# Patient Record
Sex: Female | Born: 1950 | Race: White | Hispanic: No | Marital: Married | Smoking: Former smoker
Health system: Southern US, Community
[De-identification: ages and names within clinical notes are randomized; demographics above are authoritative.]

## PROBLEM LIST (undated history)

## (undated) DIAGNOSIS — I10 Essential (primary) hypertension: Secondary | ICD-10-CM

## (undated) DIAGNOSIS — C801 Malignant (primary) neoplasm, unspecified: Secondary | ICD-10-CM

## (undated) HISTORY — DX: Essential (primary) hypertension: I10

## (undated) HISTORY — PX: ABDOMINAL HYSTERECTOMY: SHX81

---

## 1997-06-09 ENCOUNTER — Ambulatory Visit (HOSPITAL_COMMUNITY): Admission: RE | Admit: 1997-06-09 | Discharge: 1997-06-09 | Payer: Self-pay | Admitting: Obstetrics and Gynecology

## 1998-01-12 ENCOUNTER — Other Ambulatory Visit: Admission: RE | Admit: 1998-01-12 | Discharge: 1998-01-12 | Payer: Self-pay | Admitting: Obstetrics and Gynecology

## 1998-02-23 ENCOUNTER — Other Ambulatory Visit: Admission: RE | Admit: 1998-02-23 | Discharge: 1998-02-23 | Payer: Self-pay | Admitting: Obstetrics and Gynecology

## 1998-10-12 ENCOUNTER — Ambulatory Visit (HOSPITAL_COMMUNITY): Admission: RE | Admit: 1998-10-12 | Discharge: 1998-10-12 | Payer: Self-pay | Admitting: Obstetrics and Gynecology

## 1998-10-18 ENCOUNTER — Ambulatory Visit (HOSPITAL_COMMUNITY): Admission: RE | Admit: 1998-10-18 | Discharge: 1998-10-18 | Payer: Self-pay | Admitting: Obstetrics and Gynecology

## 1998-11-08 ENCOUNTER — Ambulatory Visit (HOSPITAL_COMMUNITY): Admission: RE | Admit: 1998-11-08 | Discharge: 1998-11-08 | Payer: Self-pay | Admitting: Obstetrics and Gynecology

## 1998-11-08 ENCOUNTER — Encounter (INDEPENDENT_AMBULATORY_CARE_PROVIDER_SITE_OTHER): Payer: Self-pay

## 1998-11-08 ENCOUNTER — Encounter: Payer: Self-pay | Admitting: Obstetrics and Gynecology

## 1999-06-10 ENCOUNTER — Other Ambulatory Visit: Admission: RE | Admit: 1999-06-10 | Discharge: 1999-06-10 | Payer: Self-pay | Admitting: Obstetrics and Gynecology

## 2002-02-27 ENCOUNTER — Encounter: Payer: Self-pay | Admitting: Obstetrics and Gynecology

## 2002-02-27 ENCOUNTER — Encounter: Admission: RE | Admit: 2002-02-27 | Discharge: 2002-02-27 | Payer: Self-pay | Admitting: Obstetrics and Gynecology

## 2003-05-01 ENCOUNTER — Encounter: Admission: RE | Admit: 2003-05-01 | Discharge: 2003-05-01 | Payer: Self-pay | Admitting: Family Medicine

## 2004-09-30 ENCOUNTER — Encounter: Admission: RE | Admit: 2004-09-30 | Discharge: 2004-09-30 | Payer: Self-pay | Admitting: Family Medicine

## 2005-11-16 ENCOUNTER — Encounter: Admission: RE | Admit: 2005-11-16 | Discharge: 2005-11-16 | Payer: Self-pay | Admitting: Family Medicine

## 2006-12-28 ENCOUNTER — Encounter: Admission: RE | Admit: 2006-12-28 | Discharge: 2006-12-28 | Payer: Self-pay | Admitting: Family Medicine

## 2008-01-23 ENCOUNTER — Encounter: Admission: RE | Admit: 2008-01-23 | Discharge: 2008-01-23 | Payer: Self-pay | Admitting: Family Medicine

## 2009-06-11 ENCOUNTER — Encounter: Admission: RE | Admit: 2009-06-11 | Discharge: 2009-06-11 | Payer: Self-pay | Admitting: Family Medicine

## 2010-05-15 ENCOUNTER — Encounter: Payer: Self-pay | Admitting: Family Medicine

## 2010-05-16 ENCOUNTER — Encounter: Payer: Self-pay | Admitting: Family Medicine

## 2010-06-06 ENCOUNTER — Other Ambulatory Visit: Payer: Self-pay | Admitting: Family Medicine

## 2010-06-06 DIAGNOSIS — Z1231 Encounter for screening mammogram for malignant neoplasm of breast: Secondary | ICD-10-CM

## 2010-07-05 ENCOUNTER — Ambulatory Visit
Admission: RE | Admit: 2010-07-05 | Discharge: 2010-07-05 | Disposition: A | Discharge status: Home / Self Care | Payer: BC Managed Care – PPO | Source: Ambulatory Visit | Attending: Family Medicine | Admitting: Family Medicine

## 2010-07-05 DIAGNOSIS — Z1231 Encounter for screening mammogram for malignant neoplasm of breast: Secondary | ICD-10-CM

## 2011-07-11 ENCOUNTER — Other Ambulatory Visit: Payer: Self-pay | Admitting: Family Medicine

## 2011-07-11 DIAGNOSIS — Z1231 Encounter for screening mammogram for malignant neoplasm of breast: Secondary | ICD-10-CM

## 2011-07-24 ENCOUNTER — Ambulatory Visit
Admission: RE | Admit: 2011-07-24 | Discharge: 2011-07-24 | Disposition: A | Discharge status: Home / Self Care | Payer: BC Managed Care – PPO | Source: Ambulatory Visit | Attending: Family Medicine | Admitting: Family Medicine

## 2011-07-24 DIAGNOSIS — Z1231 Encounter for screening mammogram for malignant neoplasm of breast: Secondary | ICD-10-CM

## 2012-03-05 ENCOUNTER — Emergency Department (HOSPITAL_COMMUNITY)
Admission: EM | Admit: 2012-03-05 | Discharge: 2012-03-05 | Disposition: A | Discharge status: Home / Self Care | Payer: BC Managed Care – PPO | Source: Home / Self Care

## 2012-10-07 ENCOUNTER — Other Ambulatory Visit: Payer: Self-pay

## 2012-10-07 DIAGNOSIS — Z1231 Encounter for screening mammogram for malignant neoplasm of breast: Secondary | ICD-10-CM

## 2012-11-05 ENCOUNTER — Ambulatory Visit
Admission: RE | Admit: 2012-11-05 | Discharge: 2012-11-05 | Disposition: A | Discharge status: Home / Self Care | Payer: BC Managed Care – PPO | Source: Ambulatory Visit

## 2012-11-05 DIAGNOSIS — Z1231 Encounter for screening mammogram for malignant neoplasm of breast: Secondary | ICD-10-CM

## 2013-08-16 ENCOUNTER — Encounter: Payer: Self-pay | Admitting: *Deleted

## 2013-08-16 DIAGNOSIS — I10 Essential (primary) hypertension: Secondary | ICD-10-CM

## 2013-09-09 ENCOUNTER — Encounter: Payer: Self-pay | Admitting: Family Medicine

## 2013-09-09 ENCOUNTER — Ambulatory Visit (INDEPENDENT_AMBULATORY_CARE_PROVIDER_SITE_OTHER): Payer: BC Managed Care – PPO | Admitting: Family Medicine

## 2013-09-09 VITALS — BP 146/78 | HR 80 | Temp 97.4°F | Resp 16 | Ht 64.0 in | Wt 144.0 lb

## 2013-09-09 DIAGNOSIS — I10 Essential (primary) hypertension: Secondary | ICD-10-CM

## 2013-09-09 DIAGNOSIS — F17200 Nicotine dependence, unspecified, uncomplicated: Secondary | ICD-10-CM

## 2013-09-09 DIAGNOSIS — R011 Cardiac murmur, unspecified: Secondary | ICD-10-CM

## 2013-09-09 LAB — CBC WITH DIFFERENTIAL/PLATELET
BASOS ABS: 0 10*3/uL (ref 0.0–0.1)
BASOS PCT: 0 % (ref 0–1)
EOS PCT: 4 % (ref 0–5)
Eosinophils Absolute: 0.3 10*3/uL (ref 0.0–0.7)
HCT: 40.8 % (ref 36.0–46.0)
HEMOGLOBIN: 14.5 g/dL (ref 12.0–15.0)
LYMPHS PCT: 23 % (ref 12–46)
Lymphs Abs: 2 10*3/uL (ref 0.7–4.0)
MCH: 32 pg (ref 26.0–34.0)
MCHC: 35.5 g/dL (ref 30.0–36.0)
MCV: 90.1 fL (ref 78.0–100.0)
MONO ABS: 0.9 10*3/uL (ref 0.1–1.0)
MONOS PCT: 10 % (ref 3–12)
Neutro Abs: 5.4 10*3/uL (ref 1.7–7.7)
Neutrophils Relative %: 63 % (ref 43–77)
PLATELETS: 374 10*3/uL (ref 150–400)
RBC: 4.53 MIL/uL (ref 3.87–5.11)
RDW: 13 % (ref 11.5–15.5)
WBC: 8.5 10*3/uL (ref 4.0–10.5)

## 2013-09-09 LAB — COMPREHENSIVE METABOLIC PANEL
ALT: 14 U/L (ref 0–35)
AST: 23 U/L (ref 0–37)
Albumin: 4.5 g/dL (ref 3.5–5.2)
Alkaline Phosphatase: 69 U/L (ref 39–117)
BILIRUBIN TOTAL: 0.4 mg/dL (ref 0.2–1.2)
BUN: 8 mg/dL (ref 6–23)
CALCIUM: 10.4 mg/dL (ref 8.4–10.5)
CO2: 25 meq/L (ref 19–32)
CREATININE: 0.72 mg/dL (ref 0.50–1.10)
Chloride: 103 mEq/L (ref 96–112)
Glucose, Bld: 81 mg/dL (ref 70–99)
Potassium: 4.3 mEq/L (ref 3.5–5.3)
Sodium: 142 mEq/L (ref 135–145)
Total Protein: 6.9 g/dL (ref 6.0–8.3)

## 2013-09-09 LAB — TSH: TSH: 0.465 u[IU]/mL (ref 0.350–4.500)

## 2013-09-09 MED ORDER — VERAPAMIL HCL ER 240 MG PO CP24: 240.0000 mg | ORAL_CAPSULE | Freq: Every day | ORAL | Status: DC

## 2013-09-09 MED ORDER — LISINOPRIL-HYDROCHLOROTHIAZIDE 20-12.5 MG PO TABS: 1.0000 | ORAL_TABLET | Freq: Every day | ORAL | Status: DC

## 2013-09-09 NOTE — Progress Notes (Signed)
Subjective:   This chart was scribed for Kristin Knapp, MD, by Kristin Griffin, ED Scribe. This  patient's care was started at 9: 21 AM.    Patient ID: Kristin Griffin, female    DOB: Dec 10, 1950, 63 y.o.   MRN: [id number]  Chief Complaint  Patient presents with  . Medication Refill    Lisinopril HCT    HPI  Kristin Griffin is a 63 y.o. female who presents to Kristin Griffin for a medication refill of Lisinopril-hctz and verapamil. The pt reports she ran out of her medication a week ago, and when she went to the prescribing provider, she was informed the provider had moved practices. She requests a new PCP. Kristin Griffin reports she checks her BP at home and normally has readings of an elevated systolic and a diastolic in the 75'I.  The pt denies chest pain, palpitations, and SOB. 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   The pt reports she often wakes up several times in the night and grazes on food. Last night, she ate grapes, sugar-free ice cream, and a Pepsi overnight so she is not fasting even though she has not eaten this morning.   She smokes 0.5 pack/day. She reports "high hopes" of quitting. She has chantix, but she has not used it for fear of the side effects.   Kristin Griffin had a complete physical last year. She reports she has mammograms annually and performs self-breast exams. She has a h/o hysterectomy and does not have annual pap smears.   She reports current allergies with associated rhinorrhea.   Past Medical History  Diagnosis Date  . Hypertension    Current Outpatient Prescriptions on File Prior to Visit  Medication Sig Dispense Refill  . calcium carbonate (OS-CAL) 600 MG TABS tablet Take 600 mg by mouth 2 (two) times daily with a meal.      . lisinopril-hydrochlorothiazide (PRINZIDE,ZESTORETIC) 20-12.5 MG per tablet Take 1 tablet by mouth daily.      . Multiple Vitamin (MULTIVITAMIN) capsule Take 1 capsule  by mouth daily.      . Omega-3 Fatty Acids (FISH OIL) 1000 MG CAPS Take by mouth.      . verapamil (VERELAN PM) 240 MG 24 hr capsule Take 240 mg by mouth at bedtime.      Marland Kitchen zolpidem (AMBIEN) 10 MG tablet Take 10 mg by mouth at bedtime as needed for sleep.       No current facility-administered medications on file prior to visit.    No Known Allergies   Review of Systems  Constitutional: Negative for fever, chills, activity change, appetite change and unexpected weight change.  HENT: Positive for congestion and rhinorrhea. Negative for sinus pressure.   Respiratory: Negative for cough, shortness of breath and wheezing.   Cardiovascular: Negative for chest pain, palpitations and leg swelling.  Allergic/Immunologic: Positive for environmental allergies.  Neurological: Negative for syncope.     Vitals: BP 146/78  Pulse 80  Temp(Src) 97.4 F (36.3 C) (Oral)  Resp 16  Ht 5\' 4"  (1.626 m)  Wt 144 lb (65.318 kg)  BMI 24.71 kg/m2  SpO2 96%    Objective:   Physical Exam  Nursing note and vitals reviewed. Constitutional: She is oriented to person, place, and time. She appears well-developed and well-nourished. No distress.  HENT:  Head: Normocephalic and atraumatic.  Right Ear: External ear normal.  Left Ear: External ear normal.  Eyes: Conjunctivae  and EOM are normal. No scleral icterus.  Neck: Normal range of motion. Neck supple. No tracheal deviation present. No thyromegaly present.  Cardiovascular: Normal rate, regular rhythm and intact distal pulses.   Murmur (over RUSB) heard.  Systolic murmur is present with a grade of 2/6  Pulmonary/Chest: Effort normal. No respiratory distress. She has wheezes. She has no rales.  Mild expiratory wheeze anterioraly on right.   Musculoskeletal: Normal range of motion. She exhibits no edema.  Lymphadenopathy:    She has no cervical adenopathy.  Neurological: She is alert and oriented to person, place, and time.  Skin: Skin is warm and dry.  She is not diaphoretic. No erythema.  Psychiatric: She has a normal mood and affect. Her behavior is normal.       Assessment & Plan:  Tobacco use disorder - encouraged cessation - pt has chantix - discuss at f/u  Essential hypertension, benign - Plan: Comprehensive metabolic panel, CBC with Differential, TSH, 2D Echocardiogram without contrast - restart prior meds. Recheck in 6-8 wks w/ fasting cpe.  Undiagnosed cardiac murmurs - Plan: 2D Echocardiogram without contrast - consider cardiology referral.  Meds ordered this encounter  Medications  . lisinopril-hydrochlorothiazide (PRINZIDE,ZESTORETIC) 20-12.5 MG per tablet    Sig: Take 1 tablet by mouth daily.    Dispense:  30 tablet    Refill:  1  . verapamil (VERELAN PM) 240 MG 24 hr capsule    Sig: Take 1 capsule (240 mg total) by mouth at bedtime.    Dispense:  30 capsule    Refill:  1    I personally performed the services described in this documentation, which was scribed in my presence. The recorded information has been reviewed and considered, and addended by me as needed.  Kristin Cheadle, MD MPH

## 2013-09-09 NOTE — Patient Instructions (Signed)
Hypertension As your heart beats, it forces blood through your arteries. This force is your blood pressure. If the pressure is too high, it is called hypertension (HTN) or high blood pressure. HTN is dangerous because you may have it and not know it. High blood pressure may mean that your heart has to work harder to pump blood. Your arteries may be narrow or stiff. The extra work puts you at risk for heart disease, stroke, and other problems.  Blood pressure consists of two numbers, a higher number over a lower, 110/72, for example. It is stated as "110 over 72." The ideal is below 120 for the top number (systolic) and under 80 for the bottom (diastolic). Write down your blood pressure today. You should pay close attention to your blood pressure if you have certain conditions such as:  Heart failure.  Prior heart attack.  Diabetes  Chronic kidney disease.  Prior stroke.  Multiple risk factors for heart disease. To see if you have HTN, your blood pressure should be measured while you are seated with your arm held at the level of the heart. It should be measured at least twice. A one-time elevated blood pressure reading (especially in the Emergency Department) does not mean that you need treatment. There may be conditions in which the blood pressure is different between your right and left arms. It is important to see your caregiver soon for a recheck. Most people have essential hypertension which means that there is not a specific cause. This type of high blood pressure may be lowered by changing lifestyle factors such as:  Stress.  Smoking.  Lack of exercise.  Excessive weight.  Drug/tobacco/alcohol use.  Eating less salt. Most people do not have symptoms from high blood pressure until it has caused damage to the body. Effective treatment can often prevent, delay or reduce that damage. TREATMENT  When a cause has been identified, treatment for high blood pressure is directed at the  cause. There are a large number of medications to treat HTN. These fall into several categories, and your caregiver will help you select the medicines that are best for you. Medications may have side effects. You should review side effects with your caregiver. If your blood pressure stays high after you have made lifestyle changes or started on medicines,   Your medication(s) may need to be changed.  Other problems may need to be addressed.  Be certain you understand your prescriptions, and know how and when to take your medicine.  Be sure to follow up with your caregiver within the time frame advised (usually within two weeks) to have your blood pressure rechecked and to review your medications.  If you are taking more than one medicine to lower your blood pressure, make sure you know how and at what times they should be taken. Taking two medicines at the same time can result in blood pressure that is too low. SEEK IMMEDIATE MEDICAL CARE IF:  You develop a severe headache, blurred or changing vision, or confusion.  You have unusual weakness or numbness, or a faint feeling.  You have severe chest or abdominal pain, vomiting, or breathing problems. MAKE SURE YOU:   Understand these instructions.  Will watch your condition.  Will get help right away if you are not doing well or get worse. Document Released: 04/10/2005 Document Revised: 07/03/2011 Document Reviewed: 11/29/2007 [facility] Patient Information 2014 [place]. Managing Your High Blood Pressure Blood pressure is a measurement of how forceful your blood is  pressing against the walls of the arteries. Arteries are muscular tubes within the circulatory system. Blood pressure does not stay the same. Blood pressure rises when you are active, excited, or nervous; and it lowers during sleep and relaxation. If the numbers measuring your blood pressure stay above normal most of the time, you are at risk for health problems. High blood  pressure (hypertension) is a long-term (chronic) condition in which blood pressure is elevated. A blood pressure reading is recorded as two numbers, such as 120 over 80 (or 120/80). The first, higher number is called the systolic pressure. It is a measure of the pressure in your arteries as the heart beats. The second, lower number is called the diastolic pressure. It is a measure of the pressure in your arteries as the heart relaxes between beats.  Keeping your blood pressure in a normal range is important to your overall health and prevention of health problems, such as heart disease and stroke. When your blood pressure is uncontrolled, your heart has to work harder than normal. High blood pressure is a very common condition in adults because blood pressure tends to rise with age. Men and women are equally likely to have hypertension but at different times in life. Before age 27, men are more likely to have hypertension. After 65 years of age, women are more likely to have it. Hypertension is especially common in African Americans. This condition often has no signs or symptoms. The cause of the condition is usually not known. Your caregiver can help you come up with a plan to keep your blood pressure in a normal, healthy range. BLOOD PRESSURE STAGES Blood pressure is classified into four stages: normal, prehypertension, stage 1, and stage 2. Your blood pressure reading will be used to determine what type of treatment, if any, is necessary. Appropriate treatment options are tied to these four stages:  Normal  Systolic pressure (mm Hg): below 120.  Diastolic pressure (mm Hg): below 80. Prehypertension  Systolic pressure (mm Hg): 120 to 139.  Diastolic pressure (mm Hg): 80 to 89. Stage1  Systolic pressure (mm Hg): 140 to 159.  Diastolic pressure (mm Hg): 90 to 99. Stage2  Systolic pressure (mm Hg): 160 or above.  Diastolic pressure (mm Hg): 100 or above. RISKS RELATED TO HIGH BLOOD  PRESSURE Managing your blood pressure is an important responsibility. Uncontrolled high blood pressure can lead to:  A heart attack.  A stroke.  A weakened blood vessel (aneurysm).  Heart failure.  Kidney damage.  Eye damage.  Metabolic syndrome.  Memory and concentration problems. HOW TO MANAGE YOUR BLOOD PRESSURE Blood pressure can be managed effectively with lifestyle changes and medicines (if needed). Your caregiver will help you come up with a plan to bring your blood pressure within a normal range. Your plan should include the following: Education  Read all information provided by your caregivers about how to control blood pressure.  Educate yourself on the latest guidelines and treatment recommendations. New research is always being done to further define the risks and treatments for high blood pressure. Lifestylechanges  Control your weight.  Avoid smoking.  Stay physically active.  Reduce the amount of salt in your diet.  Reduce stress.  Control any chronic conditions, such as high cholesterol or diabetes.  Reduce your alcohol intake. Medicines  Several medicines (antihypertensive medicines) are available, if needed, to bring blood pressure within a normal range. Communication  Review all the medicines you take with your caregiver because there may be  side effects or interactions.  Talk with your caregiver about your diet, exercise habits, and other lifestyle factors that may be contributing to high blood pressure.  See your caregiver regularly. Your caregiver can help you create and adjust your plan for managing high blood pressure. RECOMMENDATIONS FOR TREATMENT AND FOLLOW-UP  The following recommendations are based on current guidelines for managing high blood pressure in nonpregnant adults. Use these recommendations to identify the proper follow-up period or treatment option based on your blood pressure reading. You can discuss these options with your  caregiver.  Systolic pressure of 297 to 989 or diastolic pressure of 80 to 89: Follow up with your caregiver as directed.  Systolic pressure of 211 to 941 or diastolic pressure of 90 to 100: Follow up with your caregiver within 2 months.  Systolic pressure above 740 or diastolic pressure above 814: Follow up with your caregiver within 1 month.  Systolic pressure above 481 or diastolic pressure above 856: Consider antihypertensive therapy; follow up with your caregiver within 1 week.  Systolic pressure above 314 or diastolic pressure above 970: Begin antihypertensive therapy; follow up with your caregiver within 1 week. Document Released: 01/03/2012 Document Reviewed: 01/03/2012 [facility] Patient Information 2014 [place], [place]. Smoking Cessation Quitting smoking is important to your health and has many advantages. However, it is not always easy to quit since nicotine is a very addictive drug. Often times, people try 3 times or more before being able to quit. This document explains the best ways for you to prepare to quit smoking. Quitting takes hard work and a lot of effort, but you can do it. ADVANTAGES OF QUITTING SMOKING  You will live longer, feel better, and live better.  Your body will feel the impact of quitting smoking almost immediately.  Within 20 minutes, blood pressure decreases. Your pulse returns to its normal level.  After 8 hours, carbon monoxide levels in the blood return to normal. Your oxygen level increases.  After 24 hours, the chance of having a heart attack starts to decrease. Your breath, hair, and body stop smelling like smoke.  After 48 hours, damaged nerve endings begin to recover. Your sense of taste and smell improve.  After 72 hours, the body is virtually free of nicotine. Your bronchial tubes relax and breathing becomes easier.  After 2 to 12 weeks, lungs can hold more air. Exercise becomes easier and circulation improves.  The risk of having a heart  attack, stroke, cancer, or lung disease is greatly reduced.  After 1 year, the risk of coronary heart disease is cut in half.  After 5 years, the risk of stroke falls to the same as a nonsmoker.  After 10 years, the risk of lung cancer is cut in half and the risk of other cancers decreases significantly.  After 15 years, the risk of coronary heart disease drops, usually to the level of a nonsmoker.  If you are pregnant, quitting smoking will improve your chances of having a healthy baby.  The people you live with, especially any children, will be healthier.  You will have extra money to spend on things other than cigarettes. QUESTIONS TO THINK ABOUT BEFORE ATTEMPTING TO QUIT You may want to talk about your answers with your caregiver.  Why do you want to quit?  If you tried to quit in the past, what helped and what did not?  What will be the most difficult situations for you after you quit? How will you plan to handle them?  Who  can help you through the tough times? Your family? Friends? A caregiver?  What pleasures do you get from smoking? What ways can you still get pleasure if you quit? Here are some questions to ask your caregiver:  How can you help me to be successful at quitting?  What medicine do you think would be best for me and how should I take it?  What should I do if I need more help?  What is smoking withdrawal like? How can I get information on withdrawal? GET READY  Set a quit date.  Change your environment by getting rid of all cigarettes, ashtrays, matches, and lighters in your home, car, or work. Do not let people smoke in your home.  Review your past attempts to quit. Think about what worked and what did not. GET SUPPORT AND ENCOURAGEMENT You have a better chance of being successful if you have help. You can get support in many ways.  Tell your family, friends, and co-workers that you are going to quit and need their support. Ask them not to smoke  around you.  Get individual, group, or telephone counseling and support. Programs are available at [employer] and health centers. Call your local health department for information about programs in your area.  Spiritual beliefs and practices may help some smokers quit.  Download a "quit meter" on your computer to keep track of quit statistics, such as how long you have gone without smoking, cigarettes not smoked, and money saved.  Get a self-help book about quitting smoking and staying off of tobacco. [place] yourself from urges to smoke. Talk to someone, go for a walk, or occupy your time with a task.  Change your normal routine. Take a different route to work. Drink tea instead of coffee. Eat breakfast in a different place.  Reduce your stress. Take a hot bath, exercise, or read a book.  Plan something enjoyable to do every day. Reward yourself for not smoking.  Explore interactive web-based programs that specialize in helping you quit. GET MEDICINE AND USE IT CORRECTLY Medicines can help you stop smoking and decrease the urge to smoke. Combining medicine with the above behavioral methods and support can greatly increase your chances of successfully quitting smoking.  Nicotine replacement therapy helps deliver nicotine to your body without the negative effects and risks of smoking. Nicotine replacement therapy includes nicotine gum, lozenges, inhalers, nasal sprays, and skin patches. Some may be available over-the-counter and others require a prescription.  Antidepressant medicine helps people abstain from smoking, but how this works is unknown. This medicine is available by prescription.  Nicotinic receptor partial agonist medicine simulates the effect of nicotine in your brain. This medicine is available by prescription. Ask your caregiver for advice about which medicines to use and how to use them based on your health history. Your caregiver will  tell you what side effects to look out for if you choose to be on a medicine or therapy. Carefully read the information on the package. Do not use any other product containing nicotine while using a nicotine replacement product.  RELAPSE OR DIFFICULT SITUATIONS Most relapses occur within the first 3 months after quitting. Do not be discouraged if you start smoking again. Remember, most people try several times before finally quitting. You may have symptoms of withdrawal because your body is used to nicotine. You may crave cigarettes, be irritable, feel very hungry, cough often, get headaches, or have difficulty concentrating. The withdrawal  symptoms are only temporary. They are strongest when you first quit, but they will go away within 10 14 days. To reduce the chances of relapse, try to:  Avoid drinking alcohol. Drinking lowers your chances of successfully quitting.  Reduce the amount of caffeine you consume. Once you quit smoking, the amount of caffeine in your body increases and can give you symptoms, such as a rapid heartbeat, sweating, and anxiety.  Avoid smokers because they can make you want to smoke.  Do not let weight gain distract you. Many smokers will gain weight when they quit, usually less than 10 pounds. Eat a healthy diet and stay active. You can always lose the weight gained after you quit.  Find ways to improve your mood other than smoking. FOR MORE INFORMATION  www.smokefree.gov  Document Released: 04/04/2001 Document Revised: 10/10/2011 Document Reviewed: 07/20/2011 [facility] Patient Information 2014 [place], [place].

## 2013-09-18 ENCOUNTER — Telehealth: Payer: Self-pay | Admitting: Family Medicine

## 2013-09-18 NOTE — Telephone Encounter (Signed)
Message copied by Chinita Pester on Thu Sep 18, 2013  4:14 PM ------      Message from: [place], [name]      Created: Tue Sep 09, 2013  9:22 AM       Please call pt to set up appt for full physical and blood pressure recheck in about 6 wks.  ES ------

## 2013-09-18 NOTE — Telephone Encounter (Signed)
LMOM to CB. 

## 2013-11-10 ENCOUNTER — Telehealth: Payer: Self-pay

## 2013-11-10 NOTE — Telephone Encounter (Signed)
Dr. Brigitte Pulse:  We had to move [facility] appt. From 7/24 to 7/31 for a CPE due to a scheduling error.  She needs refills on her blood pressure medicine.  She uses [facility] on [address]  Patient states if she can get another refill, she would prefer to have her CPE on your next available early a.m. Appointment due to them being very short staffed at work.  Her work number is [phone].

## 2013-11-11 MED ORDER — LISINOPRIL-HYDROCHLOROTHIAZIDE 20-12.5 MG PO TABS: 1.0000 | ORAL_TABLET | Freq: Every day | ORAL | Status: DC

## 2013-11-11 MED ORDER — VERAPAMIL HCL ER 240 MG PO CP24: 240.0000 mg | ORAL_CAPSULE | Freq: Every day | ORAL | Status: DC

## 2013-11-11 NOTE — Telephone Encounter (Signed)
Sent in RFs of both BP meds. Do you want to let the pt know and reschedule her appt?

## 2013-11-21 ENCOUNTER — Encounter: Payer: Self-pay | Admitting: Family Medicine

## 2013-11-21 ENCOUNTER — Ambulatory Visit (INDEPENDENT_AMBULATORY_CARE_PROVIDER_SITE_OTHER): Payer: BC Managed Care – PPO | Admitting: Family Medicine

## 2013-11-21 VITALS — BP 153/96 | HR 85 | Temp 98.1°F | Resp 18 | Ht 65.0 in | Wt 113.0 lb

## 2013-11-21 DIAGNOSIS — Z1239 Encounter for other screening for malignant neoplasm of breast: Secondary | ICD-10-CM

## 2013-11-21 DIAGNOSIS — F17200 Nicotine dependence, unspecified, uncomplicated: Secondary | ICD-10-CM

## 2013-11-21 DIAGNOSIS — Z0000 Encounter for general adult medical examination without abnormal findings: Secondary | ICD-10-CM

## 2013-11-21 DIAGNOSIS — Z780 Asymptomatic menopausal state: Secondary | ICD-10-CM

## 2013-11-21 DIAGNOSIS — I10 Essential (primary) hypertension: Secondary | ICD-10-CM

## 2013-11-21 DIAGNOSIS — Z79899 Other long term (current) drug therapy: Secondary | ICD-10-CM

## 2013-11-21 DIAGNOSIS — Z1211 Encounter for screening for malignant neoplasm of colon: Secondary | ICD-10-CM

## 2013-11-21 MED ORDER — VERAPAMIL HCL ER 240 MG PO CP24: 240.0000 mg | ORAL_CAPSULE | Freq: Every day | ORAL | Status: DC

## 2013-11-21 MED ORDER — LISINOPRIL-HYDROCHLOROTHIAZIDE 20-25 MG PO TABS: 1.0000 | ORAL_TABLET | Freq: Every day | ORAL | Status: DC

## 2013-11-21 NOTE — Progress Notes (Signed)
Subjective:    Patient ID: Kristin Griffin, female    DOB: 1950/10/06, 63 y.o.   MRN: [id number] This chart was scribed for Shawnee Knapp, MD by Cathie Hoops, ED Scribe. The patient was seen in Room 29. The patient's care was started at 4:13 PM.   Chief Complaint  Patient presents with  . Annual Exam   Past Medical History  Diagnosis Date  . Hypertension    Current Outpatient Prescriptions on File Prior to Visit  Medication Sig Dispense Refill  . calcium carbonate (OS-CAL) 600 MG TABS tablet Take 600 mg by mouth 2 (two) times daily with a meal.      . lisinopril-hydrochlorothiazide (PRINZIDE,ZESTORETIC) 20-12.5 MG per tablet Take 1 tablet by mouth daily.  30 tablet  0  . Multiple Vitamin (MULTIVITAMIN) capsule Take 1 capsule by mouth daily.      . Omega-3 Fatty Acids (FISH OIL) 1000 MG CAPS Take by mouth.      . verapamil (VERELAN PM) 240 MG 24 hr capsule Take 1 capsule (240 mg total) by mouth at bedtime.  30 capsule  0  . zolpidem (AMBIEN) 10 MG tablet Take 10 mg by mouth at bedtime as needed for sleep.       No current facility-administered medications on file prior to visit.   No Known Allergies  History reviewed. No pertinent past surgical history. History   Social History  . Marital Status: Married    Spouse Name: N/A    Number of Children: N/A  . Years of Education: N/A   Social History Main Topics  . Smoking status: Current Every Day Smoker -- 0.50 packs/day  . Smokeless tobacco: None  . Alcohol Use: No  . Drug Use: No  . Sexual Activity: None   Other Topics Concern  . None   Social History Narrative  . None   History reviewed. No pertinent family history.   HPI HPI Comments: Kristin Griffin is a 63 y.o. female who presents to the [facility] for annual exam. Pt reports she is anxious about seeing doctors and attributes her high BP today to this. Pt reports her BP at home is normally 130s/80s. Pt denies urgency, frequency, dysuria or  hematuria. Pt reports her cholesterol will be high because her HDL would be high compared to LDL. Pt reports her cardiologist thinks she may need medication to control her hyperlipemia. Pt reports she has decreased her smoking to 0.25 pack/day. Pt states she needs her MMG. Pt denies she's ever having bone density checked. Pt reports she is due for her colonoscopy. Pt states she is on a strict fixed income. Pt states she'd like to she Dr. Collene Mares for her colonoscopy. Pt reports she is not up-to-date on her tetanus. Pt reports she had her shingles vaccine 5 years ago. Pt would like to wait on her tetanus. Pt reports she has a diagnosed enlarged heart. Pt reports she previously had several years of non-controlled BP medication.   Kristin Griffin was seen 2 month prior, she had been off her BP medication so they were restarted. She was thinking about trying Chantix to quit smoking. She has MMG annually. She does not need pap smear due to history of hysterectomy. She was referred for an echocardiogram. Her MMG was last done 11/06/2012.    Review of Systems  Constitutional: Negative for fever and fatigue.  HENT: Negative for congestion and drooling.   Eyes: Negative for pain.  Respiratory: Negative for cough  and shortness of breath.   Gastrointestinal: Negative for nausea, vomiting and abdominal pain.  Genitourinary: Negative for dysuria, urgency, frequency and hematuria.  Musculoskeletal: Negative for neck pain.  Skin: Negative for color change.  Neurological: Negative for dizziness and headaches.  Psychiatric/Behavioral: Negative for behavioral problems.  All other systems reviewed and are negative.     Objective:   Physical Exam  Nursing note and vitals reviewed. Constitutional: She is oriented to person, place, and time. She appears well-developed.  HENT:  Head: Normocephalic and atraumatic.  Right Ear: Tympanic membrane normal.  Left Ear: Tympanic membrane normal.  Nose: Nose normal.    Mouth/Throat: Oropharynx is clear and moist.  Eyes: Conjunctivae and EOM are normal. No scleral icterus.  Neck: Neck supple. No thyromegaly present.  Cardiovascular: Normal rate, regular rhythm, S1 normal, S2 normal and normal heart sounds.   No murmur heard. Pulmonary/Chest: Effort normal and breath sounds normal. No respiratory distress.  Lungs clear.  Abdominal: Soft. Bowel sounds are normal. She exhibits no distension. There is no tenderness. There is no rebound.  Musculoskeletal: Normal range of motion. She exhibits no edema.  Lymphadenopathy:    She has no cervical adenopathy.  Neurological: She is oriented to person, place, and time.  Skin: No rash noted. No erythema.  Psychiatric: She has a normal mood and affect. Her behavior is normal.     Triage Vitals: BP 153/96  Pulse 85  Temp(Src) 98.1 F (36.7 C) (Oral)  Resp 18  Ht 5\' 5"  (1.651 m)  Wt 113 lb (51.256 kg)  BMI 18.80 kg/m2  SpO2 96%  Assessment & Plan:  4:28 PM- Patient informed of current plan for treatment and evaluation and agrees with plan at this time.  Pt will check BP at home, if not improved pt will schedule a follow-up appointment.   Kristin Griffin never got the echocardiogram that I referred her for after our last visit. At last visit I heard 2+/6 systolic murmur over her right upper sternal border she was also having wheezing likely due to her COPD at that time.  Routine general medical examination at a health care facility - Plan: MM Digital Screening, DG Bone Density, Lipid panel, Comprehensive metabolic panel, Vit D  25 hydroxy (rtn osteoporosis monitoring)  Tobacco use disorder - Plan: DG Bone Density, Vit D  25 hydroxy (rtn osteoporosis monitoring)  Essential hypertension, benign - Plan: Lipid panel, Comprehensive metabolic panel  Special screening for malignant neoplasms, colon - Plan: Ambulatory referral to Gastroenterology  Screening for breast cancer - Plan: MM Digital  Screening  Postmenopausal estrogen deficiency - Plan: MM Digital Screening, DG Bone Density, Vit D  25 hydroxy (rtn osteoporosis monitoring)  Encounter for long-term (current) use of other medications - Plan: Comprehensive metabolic panel  Meds ordered this encounter  Medications  . lisinopril-hydrochlorothiazide (PRINZIDE,ZESTORETIC) 20-25 MG per tablet    Sig: Take 1 tablet by mouth daily.    Dispense:  90 tablet    Refill:  1  . verapamil (VERELAN PM) 240 MG 24 hr capsule    Sig: Take 1 capsule (240 mg total) by mouth at bedtime.    Dispense:  30 capsule    Refill:  5    I personally performed the services described in this documentation, which was scribed in my presence. The recorded information has been reviewed and considered, and addended by me as needed.  Delman Cheadle, MD MPH  Results for orders placed in visit on 11/21/13  LIPID PANEL  Result Value Ref Range   Cholesterol 241 (*) 0 - 200 mg/dL   Triglycerides 90  <150 mg/dL   HDL 107  >39 mg/dL   Total CHOL/HDL Ratio 2.3     VLDL 18  0 - 40 mg/dL   LDL Cholesterol 116 (*) 0 - 99 mg/dL  COMPREHENSIVE METABOLIC PANEL      Result Value Ref Range   Sodium 143  135 - 145 mEq/L   Potassium 4.5  3.5 - 5.3 mEq/L   Chloride 103  96 - 112 mEq/L   CO2 28  19 - 32 mEq/L   Glucose, Bld 91  70 - 99 mg/dL   BUN 9  6 - 23 mg/dL   Creat 0.76  0.50 - 1.10 mg/dL   Total Bilirubin 0.7  0.2 - 1.2 mg/dL   Alkaline Phosphatase 67  39 - 117 U/L   AST 23  0 - 37 U/L   ALT 16  0 - 35 U/L   Total Protein 7.4  6.0 - 8.3 g/dL   Albumin 4.5  3.5 - 5.2 g/dL   Calcium 10.4  8.4 - 10.5 mg/dL  VITAMIN D 25 HYDROXY      Result Value Ref Range   Vit D, 25-Hydroxy 74  30 - 89 ng/mL

## 2013-11-21 NOTE — Patient Instructions (Addendum)
After you turn 65 yo, you will need a prevnar pneumonia vaccine with a pneumovax pneumonia vaccine a year later. You should get your tetanus shot updated right before you retire since medicare does not pay for a TDaP immunization. We will want to get a baseline EKG at your next visit. We can now screen for lung cancer with a low radiation dose chest CT - if you would like to be referred for this, let me know. CONGRATULATIONS on cutting down on cigarettes - keep up the fantastic work! Check your blood pressure at home and if it is still >130 on top or >85 on bottom consistently, call or send a message through MyChart so we can increase your verapamil.  Keeping You Healthy  Get These Tests  Blood Pressure- Have your blood pressure checked by your healthcare provider at least once a year.  Normal blood pressure is 120/80.  Weight- Have your body mass index (BMI) calculated to screen for obesity.  BMI is a measure of body fat based on height and weight.  You can calculate your own BMI at [employer]  Cholesterol- Have your cholesterol checked every year.  Diabetes- Have your blood sugar checked every year if you have high blood pressure, high cholesterol, a family history of diabetes or if you are overweight.  Pap Smear- Have a pap smear every 1 to 3 years if you have been sexually active.  If you are older than 65 and recent pap smears have been normal you may not need additional pap smears.  In addition, if you have had a hysterectomy  For benign disease additional pap smears are not necessary.  Mammogram-Yearly mammograms are essential for early detection of breast cancer  Screening for Colon Cancer- Colonoscopy starting at age 65. Screening may begin sooner depending on your family history and other health conditions.  Follow up colonoscopy as directed by your Gastroenterologist.  Screening for Osteoporosis- Screening begins at age 65 with bone density scanning, sooner if you  are at higher risk for developing Osteoporosis.  Get these medicines  Calcium with Vitamin D- Your body requires 1200-1500 mg of Calcium a day and [phone] IU of Vitamin D a day.  You can only absorb 500 mg of Calcium at a time therefore Calcium must be taken in 2 or 3 separate doses throughout the day.  Hormones- Hormone therapy has been associated with increased risk for certain cancers and heart disease.  Talk to your healthcare provider about if you need relief from menopausal symptoms.  Aspirin- Ask your healthcare provider about taking Aspirin to prevent Heart Disease and Stroke.  Get these Immuniztions  Flu shot- Every fall  Pneumonia shot- Once after the age of 70; if you are younger ask your healthcare provider if you need a pneumonia shot.  Tetanus- Every ten years.  Zostavax- Once after the age of 66 to prevent shingles.  Take these steps  Don't smoke- Your healthcare provider can help you quit. For tips on how to quit, ask your healthcare provider or go to www.smokefree.gov or call 1-800 QUIT-NOW.  Be physically active- Exercise 5 days a week for a minimum of 30 minutes.  If you are not already physically active, start slow and gradually work up to 30 minutes of moderate physical activity.  Try walking, dancing, bike riding, swimming, etc.  Eat a healthy diet- Eat a variety of healthy foods such as fruits, vegetables, whole grains, low fat milk, low fat cheeses, yogurt, lean meats, chicken, fish, eggs, dried  beans, tofu, etc.  For more information go to www.thenutritionsource.org  Dental visit- Brush and floss teeth twice daily; visit your dentist twice a year.  Eye exam- Visit your Optometrist or Ophthalmologist yearly.  Drink alcohol in moderation- Limit alcohol intake to one drink or less a day.  Never drink and drive.  Depression- Your emotional health is as important as your physical health.  If you're feeling down or losing interest in things you normally enjoy,  please talk to your healthcare provider.  Seat Belts- can save your life; always wear one  Smoke/Carbon Monoxide detectors- These detectors need to be installed on the appropriate level of your home.  Replace batteries at least once a year.  Violence- If anyone is threatening or hurting you, please tell your healthcare provider.  Living Will/ Health care power of attorney- Discuss with your healthcare provider and family.   Systolic pressure of 073 to 710 or diastolic pressure of 80 to 89: Follow up with me in 6 mos.  Systolic pressure of 626 to 948 or diastolic pressure of 90 to 100: Follow up with me within 1 months.  Systolic pressure above 546 or diastolic pressure above 270: Follow up with your caregiver within 1 week.  Systolic pressure above 350 or diastolic pressure above 093: Come to clinic immediately for evaluation.  Systolic pressure above 818 or diastolic pressure above 299: Go to ER immediately for evaluation and treatment - consider calling 911 if you feel abnormal at all (chest pain, headache, vision change, lightheaded, etc).  Managing Your High Blood Pressure Blood pressure is a measurement of how forceful your blood is pressing against the walls of the arteries. Arteries are muscular tubes within the circulatory system. Blood pressure does not stay the same. Blood pressure rises when you are active, excited, or nervous; and it lowers during sleep and relaxation. If the numbers measuring your blood pressure stay above normal most of the time, you are at risk for health problems. High blood pressure (hypertension) is a long-term (chronic) condition in which blood pressure is elevated. A blood pressure reading is recorded as two numbers, such as 120 over 80 (or 120/80). The first, higher number is called the systolic pressure. It is a measure of the pressure in your arteries as the heart beats. The second, lower number is called the diastolic pressure. It is a measure of the  pressure in your arteries as the heart relaxes between beats.  Keeping your blood pressure in a normal range is important to your overall health and prevention of health problems, such as heart disease and stroke. When your blood pressure is uncontrolled, your heart has to work harder than normal. High blood pressure is a very common condition in adults because blood pressure tends to rise with age. Men and women are equally likely to have hypertension but at different times in life. Before age 30, men are more likely to have hypertension. After 65 years of age, women are more likely to have it. Hypertension is especially common in African Americans. This condition often has no signs or symptoms. The cause of the condition is usually not known. Your caregiver can help you come up with a plan to keep your blood pressure in a normal, healthy range. BLOOD PRESSURE STAGES Blood pressure is classified into four stages: normal, prehypertension, stage 1, and stage 2. Your blood pressure reading will be used to determine what type of treatment, if any, is necessary. Appropriate treatment options are tied to these four  stages:  Normal  Systolic pressure (mm Hg): below 120.  Diastolic pressure (mm Hg): below 80. Prehypertension  Systolic pressure (mm Hg): 120 to 139.  Diastolic pressure (mm Hg): 80 to 89. Stage1  Systolic pressure (mm Hg): 140 to 159.  Diastolic pressure (mm Hg): 90 to 99. Stage2  Systolic pressure (mm Hg): 160 or above.  Diastolic pressure (mm Hg): 100 or above. RISKS RELATED TO HIGH BLOOD PRESSURE Managing your blood pressure is an important responsibility. Uncontrolled high blood pressure can lead to:  A heart attack.  A stroke.  A weakened blood vessel (aneurysm).  Heart failure.  Kidney damage.  Eye damage.  Metabolic syndrome.  Memory and concentration problems. HOW TO MANAGE YOUR BLOOD PRESSURE Blood pressure can be managed effectively with lifestyle changes  and medicines (if needed). Your caregiver will help you come up with a plan to bring your blood pressure within a normal range. Your plan should include the following: Education  Read all information provided by your caregivers about how to control blood pressure.  Educate yourself on the latest guidelines and treatment recommendations. New research is always being done to further define the risks and treatments for high blood pressure. Lifestylechanges  Control your weight.  Avoid smoking.  Stay physically active.  Reduce the amount of salt in your diet.  Reduce stress.  Control any chronic conditions, such as high cholesterol or diabetes.  Reduce your alcohol intake. Medicines  Several medicines (antihypertensive medicines) are available, if needed, to bring blood pressure within a normal range. Communication  Review all the medicines you take with your caregiver because there may be side effects or interactions.  Talk with your caregiver about your diet, exercise habits, and other lifestyle factors that may be contributing to high blood pressure.  See your caregiver regularly. Your caregiver can help you create and adjust your plan for managing high blood pressure. RECOMMENDATIONS FOR TREATMENT AND FOLLOW-UP  The following recommendations are based on current guidelines for managing high blood pressure in nonpregnant adults. Use these recommendations to identify the proper follow-up period or treatment option based on your blood pressure reading. You can discuss these options with your caregiver. Document Released: 01/03/2012 Document Reviewed: 01/03/2012 [facility] Patient Information 2015 [place]. This information is not intended to replace advice given to you by your health care provider. Make sure you discuss any questions you have with your health care provider.

## 2013-11-22 LAB — LIPID PANEL
CHOL/HDL RATIO: 2.3 ratio
CHOLESTEROL: 241 mg/dL — AB (ref 0–200)
HDL: 107 mg/dL (ref >39)
LDL Cholesterol: 116 mg/dL — ABNORMAL HIGH (ref 0–99)
TRIGLYCERIDES: 90 mg/dL (ref <150)
VLDL: 18 mg/dL (ref 0–40)

## 2013-11-22 LAB — COMPREHENSIVE METABOLIC PANEL
ALBUMIN: 4.5 g/dL (ref 3.5–5.2)
ALK PHOS: 67 U/L (ref 39–117)
ALT: 16 U/L (ref 0–35)
AST: 23 U/L (ref 0–37)
BILIRUBIN TOTAL: 0.7 mg/dL (ref 0.2–1.2)
BUN: 9 mg/dL (ref 6–23)
CALCIUM: 10.4 mg/dL (ref 8.4–10.5)
CO2: 28 meq/L (ref 19–32)
CREATININE: 0.76 mg/dL (ref 0.50–1.10)
Chloride: 103 mEq/L (ref 96–112)
GLUCOSE: 91 mg/dL (ref 70–99)
POTASSIUM: 4.5 meq/L (ref 3.5–5.3)
Sodium: 143 mEq/L (ref 135–145)
Total Protein: 7.4 g/dL (ref 6.0–8.3)

## 2013-11-22 LAB — VITAMIN D 25 HYDROXY (VIT D DEFICIENCY, FRACTURES): Vit D, 25-Hydroxy: 74 ng/mL (ref 30–89)

## 2013-12-15 ENCOUNTER — Ambulatory Visit
Admission: RE | Admit: 2013-12-15 | Discharge: 2013-12-15 | Disposition: A | Discharge status: Home / Self Care | Payer: BC Managed Care – PPO | Source: Ambulatory Visit | Attending: Family Medicine | Admitting: Family Medicine

## 2013-12-15 DIAGNOSIS — Z780 Asymptomatic menopausal state: Secondary | ICD-10-CM

## 2013-12-15 DIAGNOSIS — Z1239 Encounter for other screening for malignant neoplasm of breast: Secondary | ICD-10-CM

## 2013-12-15 DIAGNOSIS — Z0000 Encounter for general adult medical examination without abnormal findings: Secondary | ICD-10-CM

## 2013-12-15 DIAGNOSIS — F17200 Nicotine dependence, unspecified, uncomplicated: Secondary | ICD-10-CM

## 2013-12-15 DIAGNOSIS — M810 Age-related osteoporosis without current pathological fracture: Secondary | ICD-10-CM | POA: Insufficient documentation

## 2013-12-17 ENCOUNTER — Other Ambulatory Visit: Payer: Self-pay | Admitting: Family Medicine

## 2013-12-17 DIAGNOSIS — R928 Other abnormal and inconclusive findings on diagnostic imaging of breast: Secondary | ICD-10-CM

## 2013-12-21 ENCOUNTER — Encounter: Payer: Self-pay | Admitting: Family Medicine

## 2013-12-21 ENCOUNTER — Other Ambulatory Visit: Payer: Self-pay | Admitting: Family Medicine

## 2013-12-21 MED ORDER — ALENDRONATE SODIUM 70 MG PO TABS: 70.0000 mg | ORAL_TABLET | ORAL | Status: DC

## 2013-12-24 ENCOUNTER — Ambulatory Visit
Admission: RE | Admit: 2013-12-24 | Discharge: 2013-12-24 | Disposition: A | Discharge status: Home / Self Care | Payer: BC Managed Care – PPO | Source: Ambulatory Visit | Attending: Family Medicine | Admitting: Family Medicine

## 2013-12-24 DIAGNOSIS — R928 Other abnormal and inconclusive findings on diagnostic imaging of breast: Secondary | ICD-10-CM

## 2014-02-03 ENCOUNTER — Ambulatory Visit (INDEPENDENT_AMBULATORY_CARE_PROVIDER_SITE_OTHER): Payer: BC Managed Care – PPO | Admitting: Family Medicine

## 2014-02-03 VITALS — BP 142/90 | HR 103 | Temp 98.7°F | Resp 16 | Ht 65.0 in | Wt 112.8 lb

## 2014-02-03 DIAGNOSIS — I10 Essential (primary) hypertension: Secondary | ICD-10-CM

## 2014-02-03 DIAGNOSIS — H109 Unspecified conjunctivitis: Secondary | ICD-10-CM

## 2014-02-03 MED ORDER — GENTAMICIN SULFATE 0.3 % OP SOLN
2.0000 [drp] | OPHTHALMIC | Status: DC
Start: 2014-02-03 — End: 2015-01-12

## 2014-02-03 NOTE — Progress Notes (Addendum)
Subjective:    Patient ID: Kristin Griffin, female    DOB: February 10, 1951, 63 y.o.   MRN: [id number] This chart was scribed for Delman Cheadle, MD by Zola Button, Medical Scribe. This patient was seen in Room 4 and the patient's care was started at 12:17 PM.  Chief Complaint  Patient presents with  . Eye Problem    left eye; sxs started this morning; red and drainage issues    HPI HPI Comments: Kristin Griffin is a 63 y.o. female who presents to the [facility] complaining of an eye problem in her left eye that began last night. She has been doing well otherwise.  Eye: She notes being tender in her left eye with eye redness and thick drainage. Patient denies HA, photophobia, and vision changes. Patient wears glasses normally and has worn contacts in the past but currently feels that she can see better without her glasses for the past few years.  HTN: Her BP has been measured around high 120s/low 80s at home.  Always gets white coat HTN in office  Past Medical History  Diagnosis Date  . Hypertension    Current Outpatient Prescriptions on File Prior to Visit  Medication Sig Dispense Refill  . alendronate (FOSAMAX) 70 MG tablet Take 1 tablet (70 mg total) by mouth every 7 (seven) days. Take with a full glass of water on an empty stomach.  4 tablet  11  . calcium carbonate (OS-CAL) 600 MG TABS tablet Take 600 mg by mouth 2 (two) times daily with a meal.      . lisinopril-hydrochlorothiazide (PRINZIDE,ZESTORETIC) 20-25 MG per tablet Take 1 tablet by mouth daily.  90 tablet  1  . Multiple Vitamin (MULTIVITAMIN) capsule Take 1 capsule by mouth daily.      . Omega-3 Fatty Acids (FISH OIL) 1000 MG CAPS Take by mouth.      . verapamil (VERELAN PM) 240 MG 24 hr capsule Take 1 capsule (240 mg total) by mouth at bedtime.  30 capsule  5   No current facility-administered medications on file prior to visit.   No Known Allergies   Review of Systems  Constitutional: Negative for  appetite change and fatigue.  HENT: Negative for congestion, ear discharge and sinus pressure.   Eyes: Positive for pain, discharge and redness. Negative for photophobia and visual disturbance.  Respiratory: Negative for cough.   Cardiovascular: Negative for chest pain.  Gastrointestinal: Negative for abdominal pain and diarrhea.  Genitourinary: Negative for frequency and hematuria.  Musculoskeletal: Negative for back pain.  Skin: Negative for rash.  Neurological: Negative for seizures and headaches.  Psychiatric/Behavioral: Negative for hallucinations.       Objective:  BP 142/90  Pulse 103  Temp(Src) 98.7 F (37.1 C) (Oral)  Resp 16  Ht 5\' 5"  (1.651 m)  Wt 112 lb 12.8 oz (51.166 kg)  BMI 18.77 kg/m2  SpO2 94%  Physical Exam  Nursing note and vitals reviewed. Constitutional: She is oriented to person, place, and time. She appears well-developed and well-nourished. No distress.  HENT:  Head: Normocephalic and atraumatic.  Mouth/Throat: Oropharynx is clear and moist. No oropharyngeal exudate.  Eyes: EOM are normal. Pupils are equal, round, and reactive to light.  Left eye conjunctiva injected on medial and lateral aspects with chemosis of lateral conjunctiva around 9pm next to iris, approximately 70mm in diameter, well defined. Bilateral fundoscopic exam benign. Woods lamp with fluorescin stain after proparacaine numbing drops showed no focal uptake.  Neck: Neck supple.  Cardiovascular: Normal rate.   Pulmonary/Chest: Effort normal.  Musculoskeletal: She exhibits no edema.  Neurological: She is alert and oriented to person, place, and time. No cranial nerve deficit.  Skin: Skin is warm and dry. No rash noted.  Psychiatric: She has a normal mood and affect. Her behavior is normal.        Visual Acuity Screening   Right eye Left eye Both eyes  Without correction: 20/40 20/30 20/25   With correction: 20/40 20/40 20/40     Assessment & Plan:   Essential hypertension -  known white coat HTN - bp at home controlled on current regimen  Conjunctivitis of left eye RTC if no improvement or worsening and would likely need to see optho.  Warm wet compresses qid.  Cont drops until sxs resolved x 48hrs. Reviewed hygiene. Meds ordered this encounter  Medications  . gentamicin (GARAMYCIN) 0.3 % ophthalmic solution    Sig: Place 2 drops into the left eye every 4 (four) hours. Ok to use 2 drops every hour until you begin to improve.    Dispense:  5 mL    Refill:  0    I personally performed the services described in this documentation, which was scribed in my presence. The recorded information has been reviewed and considered, and addended by me as needed.  Delman Cheadle, MD MPH

## 2014-02-03 NOTE — Patient Instructions (Signed)
If worsening at all or any vision changes or HAs, come back to clinic immed and/or see your eye doctor immediately. Try warm wet compresses 4x/day and use 2 drops of the antibiotic eye drop every hour until you start to improve than every 4 hours while awake until symptoms have been gone for 48 hrs.  Conjunctivitis Conjunctivitis is commonly called "pink eye." Conjunctivitis can be caused by bacterial or viral infection, allergies, or injuries. There is usually redness of the lining of the eye, itching, discomfort, and sometimes discharge. There may be deposits of matter along the eyelids. A viral infection usually causes a watery discharge, while a bacterial infection causes a yellowish, thick discharge. Pink eye is very contagious and spreads by direct contact. You may be given antibiotic eyedrops as part of your treatment. Before using your eye medicine, remove all drainage from the eye by washing gently with warm water and cotton balls. Continue to use the medication until you have awakened 2 mornings in a row without discharge from the eye. Do not rub your eye. This increases the irritation and helps spread infection. Use separate towels from other household members. Wash your hands with soap and water before and after touching your eyes. Use cold compresses to reduce pain and sunglasses to relieve irritation from light. Do not wear contact lenses or wear eye makeup until the infection is gone. SEEK MEDICAL CARE IF:   Your symptoms are not better after 3 days of treatment.  You have increased pain or trouble seeing.  The outer eyelids become very red or swollen. Document Released: 05/18/2004 Document Revised: 07/03/2011 Document Reviewed: 04/10/2005 [facility] Patient Information 2015 [place], [place]. This information is not intended to replace advice given to you by your health care provider. Make sure you discuss any questions you have with your health care provider.

## 2014-02-07 ENCOUNTER — Encounter: Payer: Self-pay | Admitting: Family Medicine

## 2014-02-07 DIAGNOSIS — H5789 Other specified disorders of eye and adnexa: Secondary | ICD-10-CM

## 2014-02-07 DIAGNOSIS — H5712 Ocular pain, left eye: Secondary | ICD-10-CM

## 2014-02-09 ENCOUNTER — Telehealth: Payer: Self-pay

## 2014-02-09 NOTE — Telephone Encounter (Signed)
Pt wants to talk with dr Brigitte Pulse about being referred to a ophthamologist

## 2014-02-10 NOTE — Telephone Encounter (Signed)
Referrals is working on this referral. LM for pt to rtn call if any further questions.

## 2014-03-31 ENCOUNTER — Telehealth: Payer: Self-pay | Admitting: Family Medicine

## 2014-03-31 NOTE — Telephone Encounter (Signed)
Patient received her flu shot at [facility] on 01/09/14

## 2014-06-16 ENCOUNTER — Other Ambulatory Visit: Payer: Self-pay | Admitting: Family Medicine

## 2014-08-12 ENCOUNTER — Other Ambulatory Visit: Payer: Self-pay | Admitting: Family Medicine

## 2014-09-14 ENCOUNTER — Other Ambulatory Visit: Payer: Self-pay | Admitting: Family Medicine

## 2014-10-15 ENCOUNTER — Other Ambulatory Visit: Payer: Self-pay | Admitting: Family Medicine

## 2014-10-19 ENCOUNTER — Emergency Department (HOSPITAL_COMMUNITY): Payer: Medicare Other

## 2014-10-19 ENCOUNTER — Emergency Department (HOSPITAL_COMMUNITY)
Admission: EM | Admit: 2014-10-19 | Discharge: 2014-10-19 | Disposition: A | Discharge status: Home / Self Care | Payer: Medicare Other | Attending: Emergency Medicine | Admitting: Emergency Medicine

## 2014-10-19 ENCOUNTER — Encounter (HOSPITAL_COMMUNITY): Payer: Self-pay | Admitting: Family Medicine

## 2014-10-19 DIAGNOSIS — Z79899 Other long term (current) drug therapy: Secondary | ICD-10-CM | POA: Insufficient documentation

## 2014-10-19 DIAGNOSIS — W208XXA Other cause of strike by thrown, projected or falling object, initial encounter: Secondary | ICD-10-CM | POA: Insufficient documentation

## 2014-10-19 DIAGNOSIS — Y92009 Unspecified place in unspecified non-institutional (private) residence as the place of occurrence of the external cause: Secondary | ICD-10-CM | POA: Insufficient documentation

## 2014-10-19 DIAGNOSIS — S52602A Unspecified fracture of lower end of left ulna, initial encounter for closed fracture: Secondary | ICD-10-CM | POA: Diagnosis not present

## 2014-10-19 DIAGNOSIS — Z720 Tobacco use: Secondary | ICD-10-CM | POA: Insufficient documentation

## 2014-10-19 DIAGNOSIS — Y998 Other external cause status: Secondary | ICD-10-CM | POA: Diagnosis not present

## 2014-10-19 DIAGNOSIS — I10 Essential (primary) hypertension: Secondary | ICD-10-CM | POA: Insufficient documentation

## 2014-10-19 DIAGNOSIS — S52502A Unspecified fracture of the lower end of left radius, initial encounter for closed fracture: Secondary | ICD-10-CM | POA: Insufficient documentation

## 2014-10-19 DIAGNOSIS — Y9389 Activity, other specified: Secondary | ICD-10-CM | POA: Insufficient documentation

## 2014-10-19 DIAGNOSIS — W19XXXA Unspecified fall, initial encounter: Secondary | ICD-10-CM

## 2014-10-19 DIAGNOSIS — S6992XA Unspecified injury of left wrist, hand and finger(s), initial encounter: Secondary | ICD-10-CM | POA: Diagnosis present

## 2014-10-19 NOTE — Discharge Instructions (Signed)
1. Medications: usual home medications 2. Treatment: rest, drink plenty of fluids, ice, elevate 3. Follow Up: Please followup with your orthopedist in 3-5 days for discussion of your diagnoses and further evaluation after today's visit; if you do not have a primary care doctor use the resource guide provided to find one; Please return to the ER for swelling or increased pain    Cast or Splint Care Casts and splints support injured limbs and keep bones from moving while they heal. It is important to care for your cast or splint at home.  HOME CARE INSTRUCTIONS  Keep the cast or splint uncovered during the drying period. It can take 24 to 48 hours to dry if it is made of plaster. A fiberglass cast will dry in less than 1 hour.  Do not rest the cast on anything harder than a pillow for the first 24 hours.  Do not put weight on your injured limb or apply pressure to the cast until your health care provider gives you permission.  Keep the cast or splint dry. Wet casts or splints can lose their shape and may not support the limb as well. A wet cast that has lost its shape can also create harmful pressure on your skin when it dries. Also, wet skin can become infected.  Cover the cast or splint with a plastic bag when bathing or when out in the rain or snow. If the cast is on the trunk of the body, take sponge baths until the cast is removed.  If your cast does become wet, dry it with a towel or a blow dryer on the cool setting only.  Keep your cast or splint clean. Soiled casts may be wiped with a moistened cloth.  Do not place any hard or soft foreign objects under your cast or splint, such as cotton, toilet paper, lotion, or powder.  Do not try to scratch the skin under the cast with any object. The object could get stuck inside the cast. Also, scratching could lead to an infection. If itching is a problem, use a blow dryer on a cool setting to relieve discomfort.  Do not trim or cut your  cast or remove padding from inside of it.  Exercise all joints next to the injury that are not immobilized by the cast or splint. For example, if you have a long leg cast, exercise the hip joint and toes. If you have an arm cast or splint, exercise the shoulder, elbow, thumb, and fingers.  Elevate your injured arm or leg on 1 or 2 pillows for the first 1 to 3 days to decrease swelling and pain.It is best if you can comfortably elevate your cast so it is higher than your heart. SEEK MEDICAL CARE IF:   Your cast or splint cracks.  Your cast or splint is too tight or too loose.  You have unbearable itching inside the cast.  Your cast becomes wet or develops a soft spot or area.  You have a bad smell coming from inside your cast.  You get an object stuck under your cast.  Your skin around the cast becomes red or raw.  You have new pain or worsening pain after the cast has been applied. SEEK IMMEDIATE MEDICAL CARE IF:   You have fluid leaking through the cast.  You are unable to move your fingers or toes.  You have discolored (blue or white), cool, painful, or very swollen fingers or toes beyond the cast.  You  have tingling or numbness around the injured area.  You have severe pain or pressure under the cast.  You have any difficulty with your breathing or have shortness of breath.  You have chest pain. Document Released: 04/07/2000 Document Revised: 01/29/2013 Document Reviewed: 10/17/2012 [facility] Patient Information 2015 [place], [place]. This information is not intended to replace advice given to you by your health care provider. Make sure you discuss any questions you have with your health care provider.

## 2014-10-19 NOTE — ED Provider Notes (Signed)
CSN: [id number]     Arrival date & time 10/19/14  1512 History  This chart was scribed for non-physician practitioner, Abigail Butts, PA-C working with Sherwood Gambler, MD, by Erling Conte, ED Scribe. This patient was seen in room TR07C/TR07C and the patient's care was started at 5:09 PM.     Chief Complaint  Patient presents with  . Fall    The history is provided by the patient and medical records. No language interpreter was used.    HPI Comments: Kristin Griffin is a 64 y.o. female with no PMHx who presents to the Emergency Department complaining of a fall that occurred 1.5 days ago. Pt states she had gotten up in the middle of the night and was half asleep, tripping on a box and falling. She reports she caught herself with her left arm. She is complaining of associated left hip pain, left wrist pain and left wrist swelling. She denies any head injury or LOC from the fall. She reports she immediately ambulatory after the fall and left hip pain did not begin until the next day.  She states she is still able to walk and bear weight on her left hip. Pt denies any anticoagulant use. She states she has taken Ibuprofen, aspirin and hydrocodone. She denies any significant relief for the pain with those treatments. Pt notes she has followed up with an orthopedist in the past, Dr. Meridee Score at [facility]. She denies any rib pain or abdominal pain.    Past Medical History  Diagnosis Date  . Hypertension    History reviewed. No pertinent past surgical history. History reviewed. No pertinent family history. History  Substance Use Topics  . Smoking status: Current Every Day Smoker -- 0.50 packs/day  . Smokeless tobacco: Not on file  . Alcohol Use: No   OB History    No data available     Review of Systems  Constitutional: Negative for fever and chills.  Cardiovascular: Negative for chest pain.  Gastrointestinal: Negative for nausea, vomiting and abdominal pain.   Musculoskeletal: Positive for myalgias, joint swelling and arthralgias. Negative for back pain, gait problem, neck pain and neck stiffness.  Skin: Negative for wound.  Neurological: Negative for syncope, numbness and headaches.  Hematological: Does not bruise/bleed easily.  Psychiatric/Behavioral: The patient is not nervous/anxious.   All other systems reviewed and are negative.     Allergies  Review of patient's allergies indicates no known allergies.  Home Medications   Prior to Admission medications   Medication Sig Start Date End Date Taking? Authorizing Provider  calcium carbonate (OS-CAL) 600 MG TABS tablet Take 600 mg by mouth 2 (two) times daily with a meal.   Yes Historical Provider, MD  lisinopril-hydrochlorothiazide (PRINZIDE,ZESTORETIC) 20-25 MG per tablet take 1 tablet by mouth once daily 09/15/14  Yes Shawnee Knapp, MD  Multiple Vitamin (MULTIVITAMIN) capsule Take 1 capsule by mouth daily.   Yes Historical Provider, MD  Omega-3 Fatty Acids (FISH OIL) 1000 MG CAPS Take by mouth.   Yes Historical Provider, MD  verapamil (CALAN-SR) 240 MG CR tablet take 1 tablet by mouth at bedtime 09/15/14  Yes Shawnee Knapp, MD  verapamil (VERELAN PM) 240 MG 24 hr capsule Take 1 capsule (240 mg total) by mouth at bedtime. 11/21/13  Yes Shawnee Knapp, MD  alendronate (FOSAMAX) 70 MG tablet Take 1 tablet (70 mg total) by mouth every 7 (seven) days. Take with a full glass of water on an empty stomach. 12/21/13  Shawnee Knapp, MD  gentamicin (GARAMYCIN) 0.3 % ophthalmic solution Place 2 drops into the left eye every 4 (four) hours. Ok to use 2 drops every hour until you begin to improve. 02/03/14   Shawnee Knapp, MD   Triage Vitals:  BP 104/67 mmHg  Pulse 94  Temp(Src) 98.3 F (36.8 C)  Resp 18  SpO2 95%  Physical Exam  Constitutional: She is oriented to person, place, and time. She appears well-developed and well-nourished. No distress.  HENT:  Head: Normocephalic and atraumatic.  Nose: Nose normal.   Mouth/Throat: Uvula is midline, oropharynx is clear and moist and mucous membranes are normal.  Eyes: Conjunctivae and EOM are normal. Pupils are equal, round, and reactive to light.  Neck: No spinous process tenderness and no muscular tenderness present. No rigidity. Normal range of motion present.  Full ROM without pain No midline cervical tenderness No crepitus, deformity or step-offs No paraspinal tenderness  Cardiovascular: Normal rate, regular rhythm, normal heart sounds and intact distal pulses.   No murmur heard. Pulses:      Radial pulses are 2+ on the right side, and 2+ on the left side.       Dorsalis pedis pulses are 2+ on the right side, and 2+ on the left side.       Posterior tibial pulses are 2+ on the right side, and 2+ on the left side.  Pulmonary/Chest: Effort normal and breath sounds normal. No accessory muscle usage. No respiratory distress. She has no decreased breath sounds. She has no wheezes. She has no rhonchi. She has no rales. She exhibits no tenderness and no bony tenderness.  No contusion or ecchymosis No flail segment, crepitus or deformity Equal chest expansion  Abdominal: Soft. Normal appearance and bowel sounds are normal. There is no tenderness. There is no rigidity, no guarding and no CVA tenderness.  No contusion or ecchymosis Abd soft and nontender  Musculoskeletal: Normal range of motion.       Thoracic back: She exhibits normal range of motion.       Lumbar back: She exhibits normal range of motion.  Full range of motion of the T-spine and L-spine No tenderness to palpation of the spinous processes of the T-spine or L-spine No crepitus, deformity or step-offs No tenderness to palpation of the paraspinous muscles of the L-spine TTP of the left hip with visible ecchymosis but no deformity and full range of motion Full range of motion of the left shoulder, elbow and all fingers of the left hand; no range of motion of the left wrist due to pain.  Obvious ecchymosis and mild swelling about the left wrist without significant deformity  Lymphadenopathy:    She has no cervical adenopathy.  Neurological: She is alert and oriented to person, place, and time. She has normal reflexes. No cranial nerve deficit. GCS eye subscore is 4. GCS verbal subscore is 5. GCS motor subscore is 6.  Reflex Scores:      Bicep reflexes are 2+ on the right side and 2+ on the left side.      Brachioradialis reflexes are 2+ on the right side and 2+ on the left side.      Patellar reflexes are 2+ on the right side and 2+ on the left side.      Achilles reflexes are 2+ on the right side and 2+ on the left side. Speech is clear and goal oriented, follows commands Normal 5/5 strength in upper and lower extremities bilaterally including  dorsiflexion and plantar flexion, except for 0/5 flexion and extension of the left wrist; strong and equal grip strength Sensation normal to light and sharp touch Moves extremities without ataxia, coordination intact Normal gait and balance No Clonus  Skin: Skin is warm and dry. No rash noted. She is not diaphoretic. No erythema.  Psychiatric: She has a normal mood and affect.  Nursing note and vitals reviewed.   ED Course  Procedures (including critical care time)  DIAGNOSTIC STUDIES: Oxygen Saturation is 95% on RA, normal by my interpretation.    COORDINATION OF CARE: 5:22 PM- Will consult with hand surgeon on call for [employer].  Pt advised of plan for treatment and pt agrees.   Labs Review Labs Reviewed - No data to display  Imaging Review Dg Wrist Complete Left  10/19/2014   CLINICAL DATA:  Fall from bed yesterday with left wrist pain, initial encounter  EXAM: LEFT WRIST - COMPLETE 3+ VIEW  COMPARISON:  None.  FINDINGS: There fractures of the distal radius and ulna with mild impaction at the radial fracture site. Generalized soft tissue swelling is noted. No significant displacement is seen.  IMPRESSION:  Distal radial and ulnar fractures.   Electronically Signed   By: Inez Catalina M.D.   On: 10/19/2014 16:19   Dg Hip Unilat With Pelvis 2-3 Views Left  10/19/2014   CLINICAL DATA:  Fall from bed yesterday with persistent hip pain, initial encounter  EXAM: LEFT HIP (WITH PELVIS) 2-3 VIEWS  COMPARISON:  None.  FINDINGS: The pelvic ring is intact. No acute fracture or dislocation is seen. No soft tissue changes are noted. Diffuse vascular calcifications are noted.  IMPRESSION: No acute abnormality seen.   Electronically Signed   By: Inez Catalina M.D.   On: 10/19/2014 16:21     EKG Interpretation None      MDM   Final diagnoses:  Fall  Radius and ulna distal fracture, left, closed, initial encounter    [name] presents after fall 2 days ago with persistent left wrist pain.  Patient X-Ray with distal radius and ulna with mild impaction without angulation or displacement. Pain managed in ED.   5:57 PM Pt discussed with Dr. Lorin Mercy who will be able to follow her in the office.    Pt advised to follow up with orthopedics for further evaluation and treatment.  Pain managed in the department. Patient given sugar tong splint while in ED, conservative therapy recommended and discussed. Pt with good capillary refill, sensation and warmth to her fingers before and after splint application.  Patient will be dc home & is agreeable with above plan. I have also discussed reasons to return immediately to the ER.  Patient expresses understanding and agrees with plan.  The patient was discussed with and seen by Dr. Regenia Skeeter who agrees with the treatment plan.  I personally performed the services described in this documentation, which was scribed in my presence. The recorded information has been reviewed and is accurate.  BP 113/68 mmHg  Pulse 88  Temp(Src) 98.2 F (36.8 C) (Oral)  Resp 16  SpO2 98%   Abigail Butts, PA-C 10/19/14 2011  Sherwood Gambler, MD 10/20/14 [phone]

## 2014-10-19 NOTE — ED Notes (Signed)
Patient called x 2 with no response.

## 2014-10-19 NOTE — Progress Notes (Signed)
Orthopedic Tech Progress Note Patient Details:  Kristin Griffin Dec 29, 1950 [id number] Applied fiberglass sugar tong splint to LUE.  Pulses, sensation, motion intact before and after splinting.  Capillary refill less than 2 seconds before and after splinting.  Placed splinted LUE in arm sling.  Pt. complained of slight "numbness" in Lt. 5th finger prior to and after splinting.  Ortho Devices Type of Ortho Device: Sugartong splint, Arm sling Ortho Device/Splint Location: LUE Ortho Device/Splint Interventions: Application   Darrol Poke 10/19/2014, 6:59 PM

## 2014-10-19 NOTE — ED Notes (Signed)
Pt here for fall. sts injured left FA and possibly left hip. sts able to bear weight.

## 2014-10-19 NOTE — ED Notes (Signed)
Pt states that she was cleaning the house and fell she states that she had some upper teeth pulled and she is not able to eat properly and she feels she maybe weak from that.

## 2014-10-22 ENCOUNTER — Encounter: Payer: Self-pay | Admitting: Family Medicine

## 2014-10-23 ENCOUNTER — Ambulatory Visit
Admission: RE | Admit: 2014-10-23 | Discharge: 2014-10-23 | Disposition: A | Discharge status: Home / Self Care | Payer: No Typology Code available for payment source | Source: Ambulatory Visit | Attending: Orthopaedic Surgery | Admitting: Orthopaedic Surgery

## 2014-10-23 ENCOUNTER — Other Ambulatory Visit: Payer: Self-pay | Admitting: Orthopaedic Surgery

## 2014-10-23 ENCOUNTER — Ambulatory Visit: Payer: Self-pay | Admitting: Family Medicine

## 2014-10-23 DIAGNOSIS — M25552 Pain in left hip: Secondary | ICD-10-CM

## 2014-10-27 ENCOUNTER — Other Ambulatory Visit: Payer: Self-pay | Admitting: Orthopaedic Surgery

## 2014-10-27 ENCOUNTER — Telehealth: Payer: Self-pay

## 2014-10-27 DIAGNOSIS — M25552 Pain in left hip: Secondary | ICD-10-CM

## 2014-10-27 NOTE — Telephone Encounter (Signed)
Pt states she had to cancel

## 2014-11-16 ENCOUNTER — Other Ambulatory Visit: Payer: Self-pay | Admitting: Family Medicine

## 2014-11-17 ENCOUNTER — Other Ambulatory Visit: Payer: Self-pay | Admitting: Family Medicine

## 2014-12-24 HISTORY — PX: COLOSTOMY: SHX63

## 2015-01-01 ENCOUNTER — Encounter: Payer: Self-pay | Admitting: Family Medicine

## 2015-01-01 ENCOUNTER — Ambulatory Visit (INDEPENDENT_AMBULATORY_CARE_PROVIDER_SITE_OTHER): Payer: Medicare Other | Admitting: Family Medicine

## 2015-01-01 VITALS — BP 112/70 | HR 110 | Temp 98.1°F | Resp 16 | Ht 65.0 in

## 2015-01-01 DIAGNOSIS — R29898 Other symptoms and signs involving the musculoskeletal system: Secondary | ICD-10-CM | POA: Diagnosis not present

## 2015-01-01 DIAGNOSIS — R269 Unspecified abnormalities of gait and mobility: Secondary | ICD-10-CM

## 2015-01-01 DIAGNOSIS — F4321 Adjustment disorder with depressed mood: Secondary | ICD-10-CM

## 2015-01-01 DIAGNOSIS — R531 Weakness: Secondary | ICD-10-CM | POA: Diagnosis not present

## 2015-01-01 DIAGNOSIS — I959 Hypotension, unspecified: Secondary | ICD-10-CM | POA: Diagnosis not present

## 2015-01-01 DIAGNOSIS — R630 Anorexia: Secondary | ICD-10-CM

## 2015-01-01 DIAGNOSIS — S0990XA Unspecified injury of head, initial encounter: Secondary | ICD-10-CM | POA: Diagnosis not present

## 2015-01-01 DIAGNOSIS — R4781 Slurred speech: Secondary | ICD-10-CM | POA: Diagnosis not present

## 2015-01-01 DIAGNOSIS — S0990XS Unspecified injury of head, sequela: Secondary | ICD-10-CM

## 2015-01-01 LAB — POCT URINALYSIS DIPSTICK
Blood, UA: NEGATIVE
Glucose, UA: NEGATIVE
KETONES UA: 15
Leukocytes, UA: NEGATIVE
Nitrite, UA: NEGATIVE
PROTEIN UA: NEGATIVE
Spec Grav, UA: 1.02
Urobilinogen, UA: 0.2
pH, UA: 7

## 2015-01-01 LAB — POCT CBC
Granulocyte percent: 71.8 %G (ref 37–80)
HCT, POC: 45.5 % (ref 37.7–47.9)
Hemoglobin: 14.9 g/dL (ref 12.2–16.2)
LYMPH, POC: 1.7 (ref 0.6–3.4)
MCH, POC: 30.7 pg (ref 27–31.2)
MCHC: 32.7 g/dL (ref 31.8–35.4)
MCV: 93.7 fL (ref 80–97)
MID (cbc): 0.4 (ref 0–0.9)
MPV: 7.3 fL (ref 0–99.8)
PLATELET COUNT, POC: 408 10*3/uL (ref 142–424)
POC Granulocyte: 5.2 (ref 2–6.9)
POC LYMPH %: 23.1 % (ref 10–50)
POC MID %: 5.1 %M (ref 0–12)
RBC: 4.85 M/uL (ref 4.04–5.48)
RDW, POC: 14.1 %
WBC: 7.2 10*3/uL (ref 4.6–10.2)

## 2015-01-01 LAB — POCT UA - MICROSCOPIC ONLY
Bacteria, U Microscopic: NEGATIVE
Casts, Ur, LPF, POC: POSITIVE
Crystals, Ur, HPF, POC: NEGATIVE
Mucus, UA: NEGATIVE
RBC, URINE, MICROSCOPIC: NEGATIVE
Yeast, UA: NEGATIVE

## 2015-01-01 LAB — GLUCOSE, POCT (MANUAL RESULT ENTRY): POC Glucose: 102 mg/dl — AB (ref 70–99)

## 2015-01-01 NOTE — Progress Notes (Signed)
Weakness Subjective:  Patient ID: Kristin Griffin, female    DOB: [birth year]  Age: 64 y.o. MRN: [id number]  Patient was brought in by her son. Her husband of 52 years died 4 days ago. She had him cremated, and there is no feeling ill. She has been putting off take care of herself. She has been going downhill since since about March when she retired from her job to care for her husband. She has not been eating well. She says that she has lost some weight doorway to same as it was a year ago, apparently she had gained up from there before she lost down. She needs a little bit of peanut butter. Has been drinking sufficient liquids apparently. Takes her blood pressure medicine faithfully. Has not seen a primary care doctor for a year, is scheduled for a physical in October with Dr. Malachy Mood. She drinks about 1 drink a day, sometimes beer sometimes mixed drink. She last drove a week ago. She denies any headache. Gets lightheaded when she gets up. Has not had actual dizziness. She has not had any problems with vision. Otherwise HEENT were normal. Her chest has not been giving her any problems. No chest pain or palpitations or shortness of breath. No GI pain, nausea, vomiting, or dysuria. She doesn't like to have to urinate because she gets lightheaded when she gets up so she doesn't drink as much because of that. She had fallen earlier this summer and had a crack of her left hip. She is dragging her right leg and right arm is weak. She is supposed be right-handed. She does not have any skin lesions. When she fell back 3 months ago she hit her head and had a laceration of her scalp which she allowed to heal on its own and did not see a physician for this.  Her son lives in [place] and is here for the weekend helping her. She has some relatives around here but it is a fairly weak support structure that she has.   Objective:   Frail cachectic looking lady. Slightly slurred speech. Seems alert and  oriented. Her TMs are normal. Eyes PERRLA. Fundi appear benign. I could not get a good disc look but it appeared adequate what I could see in the room. Her throat is clear. Neck supple without nodes or thyromegaly. No carotid bruits. Chest is clear to auscultation. Heart regular without murmurs. Abdomen soft without mass or tenderness. Extremities have some little bruises. Mild effusion of both knees. Good range of motion of all extremities with passive motion though active motion is weak and dragging of the right leg. She cannot lift the right leg on its own. Deep tender reflexes are symmetrical with the exception of the right patellar reflex is 0-1+ and the right heel reflex is slightly delayed. Sensory grossly intact. She walks dragging her right foot little bit, very unsteady but can ambulate on her own. Finger nose is clumsy on the right, and a little weak on the left also.  Assessment & Plan:   Assessment:  Weakness Slurred speech Right sided weakness arm and leg History of closed head injury Anorexia Grief Possible weight loss Cachexia   Plan:   Needs chemistries, CBC, MRI of the head, urinalysis,  Results for orders placed or performed in visit on 01/01/15  POCT CBC  Result Value Ref Range   WBC 7.2 4.6 - 10.2 K/uL   Lymph, poc 1.7 0.6 - 3.4   POC LYMPH PERCENT 23.1  10 - 50 %L   MID (cbc) 0.4 0 - 0.9   POC MID % 5.1 0 - 12 %M   POC Granulocyte 5.2 2 - 6.9   Granulocyte percent 71.8 37 - 80 %G   RBC 4.85 4.04 - 5.48 M/uL   Hemoglobin 14.9 12.2 - 16.2 g/dL   HCT, POC 45.5 37.7 - 47.9 %   MCV 93.7 80 - 97 fL   MCH, POC 30.7 27 - 31.2 pg   MCHC 32.7 31.8 - 35.4 g/dL   RDW, POC 14.1 %   Platelet Count, POC 408 142 - 424 K/uL   MPV 7.3 0 - 99.8 fL  POCT glucose (manual entry)  Result Value Ref Range   POC Glucose 102 (A) 70 - 99 mg/dl  POCT UA - Microscopic Only  Result Value Ref Range   WBC, Ur, HPF, POC 0-1    RBC, urine, microscopic neg    Bacteria, U Microscopic  neg    Mucus, UA neg    Epithelial cells, urine per micros 0-2    Crystals, Ur, HPF, POC neg    Casts, Ur, LPF, POC positive    Yeast, UA neg   POCT urinalysis dipstick  Result Value Ref Range   Color, UA yellow    Clarity, UA clear    Glucose, UA neg    Bilirubin, UA small    Ketones, UA 15    Spec Grav, UA 1.020    Blood, UA neg    pH, UA 7.0    Protein, UA neg    Urobilinogen, UA 0.2    Nitrite, UA neg    Leukocytes, UA Negative Negative     Patient Instructions  Discontinue the lisinopril/HCT  Monitor the blood pressure if possible  Work hard at eating more and drinking lots of fluids  MRI of head has been scheduled for Monday at [facility]. Otherwise of at check-in at 7:45 AM.  I will let you know the results of your additional lab work in a few days  Return Tuesday to see Dr. Brigitte Pulse 9 and 2  Return sooner or go to the emergency room if problems at anytime  Advise using a cane in the left hand to minimize     HOPPER,DAVID, MD 01/01/2015 Relief

## 2015-01-01 NOTE — Patient Instructions (Addendum)
Discontinue the lisinopril/HCT  Monitor the blood pressure if possible  Work hard at eating more and drinking lots of fluids  MRI of head has been scheduled for Monday at [facility]. Otherwise of at check-in at 7:45 AM.  I will let you know the results of your additional lab work in a few days  Return Tuesday to see Dr. Brigitte Pulse 9 and 2  Return sooner or go to the emergency room if problems at anytime  Advise using a cane in the left hand to minimize

## 2015-01-02 LAB — COMPLETE METABOLIC PANEL WITH GFR
ALBUMIN: 3.8 g/dL (ref 3.6–5.1)
ALK PHOS: 99 U/L (ref 33–130)
ALT: 12 U/L (ref 6–29)
AST: 20 U/L (ref 10–35)
BUN: 9 mg/dL (ref 7–25)
CALCIUM: 10.2 mg/dL (ref 8.6–10.4)
CHLORIDE: 98 mmol/L (ref 98–110)
CO2: 28 mmol/L (ref 20–31)
CREATININE: 0.73 mg/dL (ref 0.50–0.99)
GFR, Est African American: >89 mL/min (ref >=60)
GFR, Est Non African American: 87 mL/min (ref >=60)
Glucose, Bld: 92 mg/dL (ref 65–99)
Potassium: 4.6 mmol/L (ref 3.5–5.3)
SODIUM: 142 mmol/L (ref 135–146)
Total Bilirubin: 0.7 mg/dL (ref 0.2–1.2)
Total Protein: 6.7 g/dL (ref 6.1–8.1)

## 2015-01-02 LAB — TSH: TSH: 0.358 u[IU]/mL (ref 0.350–4.500)

## 2015-01-03 ENCOUNTER — Encounter (HOSPITAL_COMMUNITY): Payer: Self-pay | Admitting: Nurse Practitioner

## 2015-01-03 ENCOUNTER — Inpatient Hospital Stay (HOSPITAL_COMMUNITY)
Admission: EM | Admit: 2015-01-03 | Discharge: 2015-01-12 | DRG: 166 | Disposition: A | Discharge status: Skilled Nursing Facility | Payer: Medicare Other | Attending: Internal Medicine | Admitting: Internal Medicine

## 2015-01-03 ENCOUNTER — Inpatient Hospital Stay (HOSPITAL_COMMUNITY): Payer: Medicare Other

## 2015-01-03 ENCOUNTER — Emergency Department (HOSPITAL_COMMUNITY): Payer: Medicare Other

## 2015-01-03 DIAGNOSIS — N289 Disorder of kidney and ureter, unspecified: Secondary | ICD-10-CM | POA: Diagnosis present

## 2015-01-03 DIAGNOSIS — M810 Age-related osteoporosis without current pathological fracture: Secondary | ICD-10-CM

## 2015-01-03 DIAGNOSIS — Z79899 Other long term (current) drug therapy: Secondary | ICD-10-CM | POA: Diagnosis not present

## 2015-01-03 DIAGNOSIS — C7931 Secondary malignant neoplasm of brain: Secondary | ICD-10-CM

## 2015-01-03 DIAGNOSIS — G936 Cerebral edema: Secondary | ICD-10-CM | POA: Diagnosis present

## 2015-01-03 DIAGNOSIS — Z90710 Acquired absence of both cervix and uterus: Secondary | ICD-10-CM

## 2015-01-03 DIAGNOSIS — IMO0002 Reserved for concepts with insufficient information to code with codable children: Secondary | ICD-10-CM

## 2015-01-03 DIAGNOSIS — M899 Disorder of bone, unspecified: Secondary | ICD-10-CM

## 2015-01-03 DIAGNOSIS — G8191 Hemiplegia, unspecified affecting right dominant side: Secondary | ICD-10-CM | POA: Diagnosis present

## 2015-01-03 DIAGNOSIS — Z681 Body mass index (BMI) 19 or less, adult: Secondary | ICD-10-CM | POA: Diagnosis not present

## 2015-01-03 DIAGNOSIS — I10 Essential (primary) hypertension: Secondary | ICD-10-CM | POA: Diagnosis not present

## 2015-01-03 DIAGNOSIS — C3431 Malignant neoplasm of lower lobe, right bronchus or lung: Secondary | ICD-10-CM

## 2015-01-03 DIAGNOSIS — Z803 Family history of malignant neoplasm of breast: Secondary | ICD-10-CM | POA: Diagnosis not present

## 2015-01-03 DIAGNOSIS — Z23 Encounter for immunization: Secondary | ICD-10-CM | POA: Diagnosis not present

## 2015-01-03 DIAGNOSIS — R9402 Abnormal brain scan: Secondary | ICD-10-CM | POA: Diagnosis not present

## 2015-01-03 DIAGNOSIS — E43 Unspecified severe protein-calorie malnutrition: Secondary | ICD-10-CM

## 2015-01-03 DIAGNOSIS — T502X5A Adverse effect of carbonic-anhydrase inhibitors, benzothiadiazides and other diuretics, initial encounter: Secondary | ICD-10-CM | POA: Diagnosis not present

## 2015-01-03 DIAGNOSIS — Z9889 Other specified postprocedural states: Secondary | ICD-10-CM

## 2015-01-03 DIAGNOSIS — R918 Other nonspecific abnormal finding of lung field: Secondary | ICD-10-CM

## 2015-01-03 DIAGNOSIS — Z419 Encounter for procedure for purposes other than remedying health state, unspecified: Secondary | ICD-10-CM

## 2015-01-03 DIAGNOSIS — R229 Localized swelling, mass and lump, unspecified: Secondary | ICD-10-CM

## 2015-01-03 DIAGNOSIS — M6289 Other specified disorders of muscle: Secondary | ICD-10-CM | POA: Diagnosis not present

## 2015-01-03 DIAGNOSIS — C801 Malignant (primary) neoplasm, unspecified: Secondary | ICD-10-CM

## 2015-01-03 DIAGNOSIS — C3490 Malignant neoplasm of unspecified part of unspecified bronchus or lung: Secondary | ICD-10-CM | POA: Diagnosis not present

## 2015-01-03 DIAGNOSIS — E876 Hypokalemia: Secondary | ICD-10-CM | POA: Diagnosis not present

## 2015-01-03 DIAGNOSIS — C7951 Secondary malignant neoplasm of bone: Secondary | ICD-10-CM | POA: Diagnosis present

## 2015-01-03 DIAGNOSIS — R911 Solitary pulmonary nodule: Secondary | ICD-10-CM

## 2015-01-03 DIAGNOSIS — J449 Chronic obstructive pulmonary disease, unspecified: Secondary | ICD-10-CM | POA: Diagnosis present

## 2015-01-03 DIAGNOSIS — G9389 Other specified disorders of brain: Secondary | ICD-10-CM

## 2015-01-03 DIAGNOSIS — J984 Other disorders of lung: Secondary | ICD-10-CM | POA: Diagnosis not present

## 2015-01-03 DIAGNOSIS — F17210 Nicotine dependence, cigarettes, uncomplicated: Secondary | ICD-10-CM | POA: Diagnosis not present

## 2015-01-03 DIAGNOSIS — R531 Weakness: Secondary | ICD-10-CM

## 2015-01-03 DIAGNOSIS — C799 Secondary malignant neoplasm of unspecified site: Secondary | ICD-10-CM

## 2015-01-03 DIAGNOSIS — M898X9 Other specified disorders of bone, unspecified site: Secondary | ICD-10-CM

## 2015-01-03 DIAGNOSIS — G939 Disorder of brain, unspecified: Secondary | ICD-10-CM | POA: Diagnosis present

## 2015-01-03 LAB — COMPREHENSIVE METABOLIC PANEL
ALT: 13 U/L — ABNORMAL LOW (ref 14–54)
ANION GAP: 12 (ref 5–15)
AST: 23 U/L (ref 15–41)
Albumin: 3.5 g/dL (ref 3.5–5.0)
Alkaline Phosphatase: 95 U/L (ref 38–126)
BUN: 9 mg/dL (ref 6–20)
CO2: 23 mmol/L (ref 22–32)
Calcium: 9.6 mg/dL (ref 8.9–10.3)
Chloride: 103 mmol/L (ref 101–111)
Creatinine, Ser: 0.79 mg/dL (ref 0.44–1.00)
GFR calc non Af Amer: >60 mL/min (ref >60)
GLUCOSE: 101 mg/dL — AB (ref 65–99)
POTASSIUM: 3.3 mmol/L — AB (ref 3.5–5.1)
Sodium: 138 mmol/L (ref 135–145)
TOTAL PROTEIN: 6.6 g/dL (ref 6.5–8.1)
Total Bilirubin: 0.7 mg/dL (ref 0.3–1.2)

## 2015-01-03 LAB — I-STAT CHEM 8, ED
BUN: 11 mg/dL (ref 6–20)
CALCIUM ION: 1.17 mmol/L (ref 1.13–1.30)
CHLORIDE: 103 mmol/L (ref 101–111)
Creatinine, Ser: 0.8 mg/dL (ref 0.44–1.00)
Glucose, Bld: 102 mg/dL — ABNORMAL HIGH (ref 65–99)
HEMATOCRIT: 48 % — AB (ref 36.0–46.0)
Hemoglobin: 16.3 g/dL — ABNORMAL HIGH (ref 12.0–15.0)
Potassium: 3.4 mmol/L — ABNORMAL LOW (ref 3.5–5.1)
SODIUM: 140 mmol/L (ref 135–145)
TCO2: 22 mmol/L (ref 0–100)

## 2015-01-03 LAB — DIFFERENTIAL
BASOS ABS: 0 10*3/uL (ref 0.0–0.1)
BASOS PCT: 0 % (ref 0–1)
Eosinophils Absolute: 0.1 10*3/uL (ref 0.0–0.7)
Eosinophils Relative: 1 % (ref 0–5)
LYMPHS ABS: 1.8 10*3/uL (ref 0.7–4.0)
LYMPHS PCT: 20 % (ref 12–46)
MONOS PCT: 9 % (ref 3–12)
Monocytes Absolute: 0.8 10*3/uL (ref 0.1–1.0)
NEUTROS ABS: 6.3 10*3/uL (ref 1.7–7.7)
Neutrophils Relative %: 70 % (ref 43–77)

## 2015-01-03 LAB — I-STAT CG4 LACTIC ACID, ED: LACTIC ACID, VENOUS: 1.55 mmol/L (ref 0.5–2.0)

## 2015-01-03 LAB — PROTIME-INR
INR: 0.98 (ref 0.00–1.49)
Prothrombin Time: 13.2 seconds (ref 11.6–15.2)

## 2015-01-03 LAB — CBC
HEMATOCRIT: 42.9 % (ref 36.0–46.0)
HEMOGLOBIN: 15 g/dL (ref 12.0–15.0)
MCH: 32.5 pg (ref 26.0–34.0)
MCHC: 35 g/dL (ref 30.0–36.0)
MCV: 92.9 fL (ref 78.0–100.0)
Platelets: 380 10*3/uL (ref 150–400)
RBC: 4.62 MIL/uL (ref 3.87–5.11)
RDW: 13.6 % (ref 11.5–15.5)
WBC: 9 10*3/uL (ref 4.0–10.5)

## 2015-01-03 LAB — CK: CK TOTAL: 43 U/L (ref 38–234)

## 2015-01-03 LAB — URINALYSIS, ROUTINE W REFLEX MICROSCOPIC
BILIRUBIN URINE: NEGATIVE
Glucose, UA: NEGATIVE mg/dL
HGB URINE DIPSTICK: NEGATIVE
Ketones, ur: 15 mg/dL — AB
NITRITE: NEGATIVE
PROTEIN: NEGATIVE mg/dL
SPECIFIC GRAVITY, URINE: 1.012 (ref 1.005–1.030)
UROBILINOGEN UA: 0.2 mg/dL (ref 0.0–1.0)
pH: 6 (ref 5.0–8.0)

## 2015-01-03 LAB — I-STAT TROPONIN, ED: Troponin i, poc: 0.01 ng/mL (ref 0.00–0.08)

## 2015-01-03 LAB — URINE MICROSCOPIC-ADD ON

## 2015-01-03 LAB — APTT: APTT: 28 s (ref 24–37)

## 2015-01-03 MED ORDER — ONDANSETRON HCL 4 MG PO TABS: 4.0000 mg | ORAL_TABLET | Freq: Four times a day (QID) | ORAL | Status: DC | PRN

## 2015-01-03 MED ORDER — ONDANSETRON HCL 4 MG/2ML IJ SOLN: 4.0000 mg | Freq: Four times a day (QID) | INTRAMUSCULAR | Status: DC | PRN

## 2015-01-03 MED ORDER — DEXAMETHASONE SODIUM PHOSPHATE 10 MG/ML IJ SOLN
10.0000 mg | Freq: Four times a day (QID) | INTRAMUSCULAR | Status: DC
  Administered 2015-01-04 – 2015-01-05 (×6): 10 mg via INTRAVENOUS
  Filled 2015-01-03 (×6): qty 1

## 2015-01-03 MED ORDER — ALUM & MAG HYDROXIDE-SIMETH 200-200-20 MG/5ML PO SUSP: 30.0000 mL | Freq: Four times a day (QID) | ORAL | Status: DC | PRN

## 2015-01-03 MED ORDER — HYDROMORPHONE HCL 1 MG/ML IJ SOLN
0.5000 mg | INTRAMUSCULAR | Status: DC | PRN
  Administered 2015-01-08 – 2015-01-11 (×4): 1 mg via INTRAVENOUS
  Filled 2015-01-03 (×4): qty 1

## 2015-01-03 MED ORDER — PANTOPRAZOLE SODIUM 40 MG IV SOLR
40.0000 mg | INTRAVENOUS | Status: DC
  Administered 2015-01-04: 40 mg via INTRAVENOUS
  Filled 2015-01-03: qty 40

## 2015-01-03 MED ORDER — OMEGA-3-ACID ETHYL ESTERS 1 G PO CAPS
1.0000 g | ORAL_CAPSULE | Freq: Every day | ORAL | Status: DC
  Administered 2015-01-04 – 2015-01-12 (×8): 1 g via ORAL
  Filled 2015-01-03 (×8): qty 1

## 2015-01-03 MED ORDER — MULTIVITAMINS PO CAPS: 1.0000 | ORAL_CAPSULE | Freq: Every day | ORAL | Status: DC

## 2015-01-03 MED ORDER — ADULT MULTIVITAMIN W/MINERALS CH
1.0000 | ORAL_TABLET | Freq: Every day | ORAL | Status: DC
  Administered 2015-01-04 – 2015-01-12 (×8): 1 via ORAL
  Filled 2015-01-03 (×8): qty 1

## 2015-01-03 MED ORDER — ACETAMINOPHEN 650 MG RE SUPP
650.0000 mg | Freq: Four times a day (QID) | RECTAL | Status: DC | PRN
Start: 2015-01-03 — End: 2015-01-12

## 2015-01-03 MED ORDER — SODIUM CHLORIDE 0.9 % IV BOLUS (SEPSIS)
1000.0000 mL | Freq: Once | INTRAVENOUS | Status: AC
  Administered 2015-01-03: 1000 mL via INTRAVENOUS

## 2015-01-03 MED ORDER — CALCIUM CARBONATE 1250 (500 CA) MG PO TABS
1.0000 | ORAL_TABLET | Freq: Every day | ORAL | Status: DC
  Administered 2015-01-04 – 2015-01-12 (×9): 500 mg via ORAL
  Filled 2015-01-03 (×10): qty 1

## 2015-01-03 MED ORDER — VERAPAMIL HCL ER 240 MG PO TBCR
240.0000 mg | EXTENDED_RELEASE_TABLET | Freq: Every day | ORAL | Status: DC
  Administered 2015-01-04 – 2015-01-11 (×9): 240 mg via ORAL
  Filled 2015-01-03 (×11): qty 1

## 2015-01-03 MED ORDER — OXYCODONE HCL 5 MG PO TABS
5.0000 mg | ORAL_TABLET | ORAL | Status: DC | PRN
  Administered 2015-01-04 – 2015-01-11 (×14): 5 mg via ORAL
  Filled 2015-01-03 (×15): qty 1

## 2015-01-03 MED ORDER — SODIUM CHLORIDE 0.9 % IJ SOLN
3.0000 mL | Freq: Two times a day (BID) | INTRAMUSCULAR | Status: DC
  Administered 2015-01-04 – 2015-01-12 (×17): 3 mL via INTRAVENOUS

## 2015-01-03 MED ORDER — ACETAMINOPHEN 325 MG PO TABS
650.0000 mg | ORAL_TABLET | Freq: Four times a day (QID) | ORAL | Status: DC | PRN
  Administered 2015-01-05 – 2015-01-11 (×4): 650 mg via ORAL
  Filled 2015-01-03 (×5): qty 2

## 2015-01-03 MED ORDER — CALCIUM CARBONATE 600 MG PO TABS: 600.0000 mg | ORAL_TABLET | Freq: Every day | ORAL | Status: DC

## 2015-01-03 MED ORDER — SODIUM CHLORIDE 0.9 % IJ SOLN: 3.0000 mL | INTRAMUSCULAR | Status: DC | PRN

## 2015-01-03 MED ORDER — SODIUM CHLORIDE 0.9 % IV SOLN: 250.0000 mL | INTRAVENOUS | Status: DC | PRN

## 2015-01-03 MED ORDER — ENSURE ENLIVE PO LIQD
237.0000 mL | Freq: Two times a day (BID) | ORAL | Status: DC
  Administered 2015-01-04 – 2015-01-12 (×13): 237 mL via ORAL

## 2015-01-03 NOTE — ED Notes (Signed)
Family brought pt today because she has been unable to move the R side of her body for 2 weeks. Her husband just passed away and now shes home alone and she can not take care of herself. She did not want to come in today but family found her crawling on floor at home so they made her come. She denies pain, alert and oriented x 4. Grips = bilaterally, R arm drift is noted. family states they cant telkl if her speech is different because she just got new dentures.

## 2015-01-03 NOTE — ED Notes (Signed)
Spoke to Dr. Ralene Bathe in regards to plan of care. Planning for admission, patient allowed to eat if pass swallow screen.

## 2015-01-03 NOTE — ED Notes (Signed)
Attempted to call report

## 2015-01-03 NOTE — ED Provider Notes (Signed)
CSN: [id number]     Arrival date & time 01/03/15  1633 History   First MD Initiated Contact with Patient 01/03/15 1810     Chief Complaint  Patient presents with  . Weakness     Patient is a 64 y.o. female presenting with weakness. The history is provided by the patient and a relative. No language interpreter was used.  Weakness   Kristin Griffin presents for evaluation of weakness. She is brought in by her brother's today because she has not been able to move the right side of her body for the last couple of weeks. Her right-sided weakness has been gradually progressive over time. She went to urgent care 2 days ago and they referred her for outpatient MRI testing. She initially developed right lower extremity weakness not gradually progressed to right upper extremity weakness. Her husband passed away 1 week ago and now she is living alone at home. Her family found her crawling on the floor at home and they urged her to come in for evaluation. She denies any headache, fevers, chest pain, shortness of breath, abdominal pain, vomiting, diarrhea. She has a history of hypertension. She has lost significant weight over the last several months.  Past Medical History  Diagnosis Date  . Hypertension    Past Surgical History  Procedure Laterality Date  . Abdominal hysterectomy     History reviewed. No pertinent family history. Social History  Substance Use Topics  . Smoking status: Former Smoker -- 0.50 packs/day    Quit date: 10/22/2013  . Smokeless tobacco: None  . Alcohol Use: Yes     Comment: daily   OB History    No data available     Review of Systems  Neurological: Positive for weakness.  All other systems reviewed and are negative.     Allergies  Review of patient's allergies indicates no known allergies.  Home Medications   Prior to Admission medications   Medication Sig Start Date End Date Taking? Authorizing Provider  calcium carbonate (OS-CAL) 600 MG TABS tablet Take  600 mg by mouth daily with breakfast.    Yes Historical Provider, MD  Multiple Vitamin (MULTIVITAMIN) capsule Take 1 capsule by mouth daily.   Yes Historical Provider, MD  Omega-3 Fatty Acids (FISH OIL) 1000 MG CAPS Take by mouth.   Yes Historical Provider, MD  verapamil (CALAN-SR) 240 MG CR tablet TAKE 1 TABLET BY MOUTH AT BEDTIME 11/18/14  Yes Chelle Jeffery, PA-C  alendronate (FOSAMAX) 70 MG tablet Take 1 tablet (70 mg total) by mouth every 7 (seven) days. Take with a full glass of water on an empty stomach. Patient not taking: Reported on 01/01/2015 12/21/13   Shawnee Knapp, MD  gentamicin (GARAMYCIN) 0.3 % ophthalmic solution Place 2 drops into the left eye every 4 (four) hours. Ok to use 2 drops every hour until you begin to improve. 02/03/14   Shawnee Knapp, MD  lisinopril-hydrochlorothiazide (PRINZIDE,ZESTORETIC) 20-25 MG per tablet take 1 tablet by mouth once daily 11/16/14   Shawnee Knapp, MD   BP 156/83 mmHg  Pulse 76  Temp(Src) 97.4 F (36.3 C) (Oral)  Resp 18  Ht '5\' 6"'$  (1.676 m)  Wt 105 lb 2.6 oz (47.7 kg)  BMI 16.98 kg/m2  SpO2 97% Physical Exam  Constitutional: She is oriented to person, place, and time. She appears well-developed and well-nourished.  HENT:  Head: Normocephalic and atraumatic.  Eyes: EOM are normal. Pupils are equal, round, and reactive to light.  Cardiovascular:  Regular rhythm.   No murmur heard. Tachycardic  Pulmonary/Chest: Effort normal and breath sounds normal. No respiratory distress.  Abdominal: Soft. There is no tenderness. There is no rebound and no guarding.  Musculoskeletal: She exhibits no edema or tenderness.  Neurological: She is alert and oriented to person, place, and time. No cranial nerve deficit.  3/5 strength in the right upper and right lower extremity. No facial asymmetry  Skin: Skin is warm and dry. There is pallor.  Psychiatric: She has a normal mood and affect. Her behavior is normal.  Nursing note and vitals reviewed.   ED Course   Procedures (including critical care time) Labs Review Labs Reviewed  COMPREHENSIVE METABOLIC PANEL - Abnormal; Notable for the following:    Potassium 3.3 (*)    Glucose, Bld 101 (*)    ALT 13 (*)    All other components within normal limits  URINALYSIS, ROUTINE W REFLEX MICROSCOPIC (NOT AT [facility]) - Abnormal; Notable for the following:    Ketones, ur 15 (*)    Leukocytes, UA SMALL (*)    All other components within normal limits  URINE MICROSCOPIC-ADD ON - Abnormal; Notable for the following:    Casts HYALINE CASTS (*)    Crystals CA OXALATE CRYSTALS (*)    All other components within normal limits  I-STAT CHEM 8, ED - Abnormal; Notable for the following:    Potassium 3.4 (*)    Glucose, Bld 102 (*)    Hemoglobin 16.3 (*)    HCT 48.0 (*)    All other components within normal limits  URINE CULTURE  PROTIME-INR  APTT  CBC  DIFFERENTIAL  CK  BASIC METABOLIC PANEL  CBC  I-STAT TROPOININ, ED  CBG MONITORING, ED  I-STAT CG4 LACTIC ACID, ED    Imaging Review Dg Chest 2 View  01/03/2015   CLINICAL DATA:  Initial evaluation for acute right-sided weakness.  EXAM: CHEST  2 VIEW  COMPARISON:  None.  FINDINGS: Transverse heart size at the upper limits of normal. Mediastinal silhouette within normal limits. Atheromatous plaque present within the aortic arch.  Lungs are mildly hyperinflated with attenuation of the pulmonary markings, consistent with emphysema. Minimal left basilar atelectasis/scarring. Similarly, probable parenchymal scarring within the peripheral right lower lobe. No focal infiltrate, pulmonary edema, or pleural effusion. No pneumothorax. There is a 15 mm nodular and somewhat lobulated density overlying the right upper lobe, indeterminate.  Osteopenia noted.  No acute osseus abnormality.  IMPRESSION: 1. Emphysema.  No superimposed active cardiopulmonary disease. 2. 14 mm nodular density within the right upper lobe. Follow-up examination with cross-sectional imaging of the  chest is recommended for further evaluation.   Electronically Signed   By: Jeannine Boga M.D.   On: 01/03/2015 21:39   Ct Head Wo Contrast  01/03/2015   CLINICAL DATA:  Right-sided hemiparesis.  EXAM: CT HEAD WITHOUT CONTRAST  TECHNIQUE: Contiguous axial images were obtained from the base of the skull through the vertex without intravenous contrast.  COMPARISON:  None.  FINDINGS: 2.5 x 2.1 cm mass is noted in the left centrum semiovale with surrounding white matter edema. Also noted is 2.3 x 1.4 cm mass in right cerebellar hemisphere with surrounding white matter edema. These findings most consistent with metastatic disease. Ventricular size is within normal limits. No significant midline shift is seen at this time. No definite hemorrhage is noted. Multiple small lucencies are noted in the bony calvarium which potentially may represent metastatic disease.  IMPRESSION: Masses with surrounding white matter edema  are noted in the left parietal lobe and right cerebellar hemisphere most consistent with metastatic disease. Also noted are multiple small lucencies in the bony calvarium which may represent metastatic disease. MRI with and without gadolinium is recommended for further evaluation.   Electronically Signed   By: Marijo Conception, M.D.   On: 01/03/2015 19:40   I have personally reviewed and evaluated these images and lab results as part of my medical decision-making.   EKG Interpretation   Date/Time:  Sunday January 03 2015 17:43:25 EDT Ventricular Rate:  114 PR Interval:  186 QRS Duration: 96 QT Interval:  332 QTC Calculation: 457 R Axis:   -93 Text Interpretation:  Sinus tachycardia Biatrial enlargement Right  superior axis deviation Pulmonary disease pattern Incomplete right bundle  branch block Right ventricular hypertrophy Septal infarct , age  undetermined Abnormal ECG Confirmed by Hazle Coca [phone]) on 01/03/2015  6:08:55 PM      MDM   Final diagnoses:  Brain mass    Patient here for evaluation of right sided weakness that's been ongoing for the last 2 weeks. CT scan demonstrates multiple brain masses concerning for metastatic disease. Discussed with patient findings of studies and need for further workup. Discussed with hospitalist regarding admission for further management and evaluation.    Quintella Reichert, MD 01/04/15 Shelah Lewandowsky

## 2015-01-03 NOTE — H&P (Signed)
[facility] Admission History and Physical       Kristin Griffin HFS:[id number] DOB: 1950-11-22 DOA: 01/03/2015  Referring physician: EDP PCP: Delman Cheadle, MD  Specialists:   Chief Complaint: Right Sided Weakness  HPI: Kristin Griffin is a 64 y.o. female with a history of HTN who presents to the ED with complaints of Right sided Weakness and progressive decline for the past 3 weeks.    She report having an unintentional weight loss of 10 pounds over the past 5 weeks.  She denies any fevers chills or night sweats.  Her husband just passed away  1 week ago.  Her brothers noticed that sh was walking and dragging her right leg and brought her to the ED.  In the ED, a Head Ct was performed and revealed a 2.5 X 2.1 cm mass in the Left Parietal Lobe, and a 2.3 x 1.4 cm mass in the Right Cerebellar Hemisphere consistent with metastatic disease.   A Chest X-ray was performed and revealed a RUL nodule, a Ct scan of the chest was ordered.   She was referred for admission.     Review of Systems:  Constitutional: No Weight Loss, No Weight Gain, Night Sweats, Fevers, Chills, Dizziness, Light Headedness, Fatigue, or Generalized Weakness HEENT: No Headaches, Difficulty Swallowing,Tooth/Dental Problems,Sore Throat,  No Sneezing, Rhinitis, Ear Ache, Nasal Congestion, or Post Nasal Drip,  Cardio-vascular:  No Chest pain, Orthopnea, PND, Edema in Lower Extremities, Anasarca, Dizziness, Palpitations  Resp: No Dyspnea, No DOE, No Productive Cough, No Non-Productive Cough, No Hemoptysis, No Wheezing.    GI: No Heartburn, Indigestion, Abdominal Pain, Nausea, Vomiting, Diarrhea, Constipation, Hematemesis, Hematochezia, Melena, Change in Bowel Habits,  Loss of Appetite  GU: No Dysuria, No Change in Color of Urine, No Urgency or Urinary Frequency, No Flank pain.  Musculoskeletal: No Joint Pain or Swelling, No Decreased Range of Motion, No Back Pain.  Neurologic: No Syncope, No Seizures, +Right Sided Weakness,  Paresthesia, Vision Disturbance or Loss, No Diplopia, No Vertigo, No Difficulty Walking,  Skin: No Rash or Lesions. Psych: No Change in Mood or Affect, No Depression or Anxiety, No Memory loss, No Confusion, or Hallucinations   Past Medical History  Diagnosis Date  . Hypertension      Past Surgical History  Procedure Laterality Date  . Abdominal hysterectomy        Prior to Admission medications   Medication Sig Start Date End Date Taking? Authorizing Provider  calcium carbonate (OS-CAL) 600 MG TABS tablet Take 600 mg by mouth daily with breakfast.    Yes Historical Provider, MD  Multiple Vitamin (MULTIVITAMIN) capsule Take 1 capsule by mouth daily.   Yes Historical Provider, MD  Omega-3 Fatty Acids (FISH OIL) 1000 MG CAPS Take by mouth.   Yes Historical Provider, MD  verapamil (CALAN-SR) 240 MG CR tablet TAKE 1 TABLET BY MOUTH AT BEDTIME 11/18/14  Yes Chelle Jeffery, PA-C  alendronate (FOSAMAX) 70 MG tablet Take 1 tablet (70 mg total) by mouth every 7 (seven) days. Take with a full glass of water on an empty stomach. Patient not taking: Reported on 01/01/2015 12/21/13   Shawnee Knapp, MD  gentamicin (GARAMYCIN) 0.3 % ophthalmic solution Place 2 drops into the left eye every 4 (four) hours. Ok to use 2 drops every hour until you begin to improve. 02/03/14   Shawnee Knapp, MD  lisinopril-hydrochlorothiazide (PRINZIDE,ZESTORETIC) 20-25 MG per tablet take 1 tablet by mouth once daily 11/16/14   Shawnee Knapp, MD  No Known Allergies  Social History:  reports that she has been smoking.  She does not have any smokeless tobacco history on file. She reports that she drinks alcohol. She reports that she does not use illicit drugs.     Family History:    HTN in Both Parents  CAD in Father  Breast Cancer in Maternal Aunt, and Paternal Aunt   Physical Exam:  GEN:  Pleasant Thin  64 y.o. Caucasian female examined and in no acute distress; cooperative with exam Filed Vitals:   01/03/15 1845  01/03/15 1900 01/03/15 2015 01/03/15 2030  BP: 145/74 126/77 144/84 141/79  Pulse: 86 85 85 86  Temp:      TempSrc:      Resp: '17 17 16 20  ' SpO2: 95% 97% 95% 98%   Blood pressure 141/79, pulse 86, temperature 97.9 F (36.6 C), temperature source Oral, resp. rate 20, SpO2 98 %. PSYCH: She is alert and oriented x4; does not appear anxious but does appear depressed;  HEENT: Normocephalic and Atraumatic, Mucous membranes pink; PERRLA; EOM intact; Fundi:  Benign;  No scleral icterus, Nares: Patent, Oropharynx: Clear, Fair Dentition,    Neck:  FROM, No Cervical Lymphadenopathy nor Thyromegaly or Carotid Bruit; No JVD; Breasts:: Not examined CHEST WALL: No tenderness CHEST: Normal respiration, clear to auscultation bilaterally HEART: Regular rate and rhythm; no murmurs rubs or gallops BACK: No kyphosis or scoliosis; No CVA tenderness ABDOMEN: Positive Bowel Sounds, Scaphoid,Soft Non-Tender, No Rebound or Guarding; No Masses, No Organomegaly Rectal Exam: Not done EXTREMITIES: No Cyanosis, Clubbing, or Edema; No Ulcerations. Genitalia: not examined PULSES: 2+ and symmetric SKIN: Normal hydration no rash or ulceration CNS:  Alert and Oriented x 4,+Right Sided Weakness  5-Minus in RUE, 4-Minus in RLE.   Vascular: pulses palpable throughout    Labs on Admission:  Basic Metabolic Panel:  Recent Labs Lab 01/01/15 1508 01/03/15 1819 01/03/15 1841  NA 142 138 140  K 4.6 3.3* 3.4*  CL 98 103 103  CO2 28 23  --   GLUCOSE 92 101* 102*  BUN '9 9 11  ' CREATININE 0.73 0.79 0.80  CALCIUM 10.2 9.6  --    Liver Function Tests:  Recent Labs Lab 01/01/15 1508 01/03/15 1819  AST 20 23  ALT 12 13*  ALKPHOS 99 95  BILITOT 0.7 0.7  PROT 6.7 6.6  ALBUMIN 3.8 3.5   No results for input(s): LIPASE, AMYLASE in the last 168 hours. No results for input(s): AMMONIA in the last 168 hours. CBC:  Recent Labs Lab 01/01/15 1524 01/03/15 1819 01/03/15 1841  WBC 7.2 9.0  --   NEUTROABS  --  6.3   --   HGB 14.9 15.0 16.3*  HCT 45.5 42.9 48.0*  MCV 93.7 92.9  --   PLT  --  380  --    Cardiac Enzymes:  Recent Labs Lab 01/03/15 1819  CKTOTAL 43    BNP (last 3 results) No results for input(s): BNP in the last 8760 hours.  ProBNP (last 3 results) No results for input(s): PROBNP in the last 8760 hours.  CBG: No results for input(s): GLUCAP in the last 168 hours.  Radiological Exams on Admission: Ct Head Wo Contrast  01/03/2015   CLINICAL DATA:  Right-sided hemiparesis.  EXAM: CT HEAD WITHOUT CONTRAST  TECHNIQUE: Contiguous axial images were obtained from the base of the skull through the vertex without intravenous contrast.  COMPARISON:  None.  FINDINGS: 2.5 x 2.1 cm mass is noted in the  left centrum semiovale with surrounding white matter edema. Also noted is 2.3 x 1.4 cm mass in right cerebellar hemisphere with surrounding white matter edema. These findings most consistent with metastatic disease. Ventricular size is within normal limits. No significant midline shift is seen at this time. No definite hemorrhage is noted. Multiple small lucencies are noted in the bony calvarium which potentially may represent metastatic disease.  IMPRESSION: Masses with surrounding white matter edema are noted in the left parietal lobe and right cerebellar hemisphere most consistent with metastatic disease. Also noted are multiple small lucencies in the bony calvarium which may represent metastatic disease. MRI with and without gadolinium is recommended for further evaluation.   Electronically Signed   By: Marijo Conception, M.D.   On: 01/03/2015 19:40     EKG: Independently reviewed. Sinus Tachycardia rate =114,  +RBBB    Assessment/Plan:    64 y.o. female with  Principal Problem:   1.    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    Right sided weakness- due to #1   Neuro Checks     3.    Lung  nodule   Notify Oncology in AM     4.    Hypertension   Continue Verapamil Rx, and Lisinopril /HCTz Rx   Monitor BPs     5.    Osteoporosis   Continue Alendronate Rx     6.    Hypokalemia- Mild K+ = 3.4 due to HCTZ rx   Replace K+   Check Magnesium     7.    DVT Prophylaxis   SCDs    Code Status:     FULL CODE       Family Communication:   2 Brothers at Bedside    Disposition Plan:    Inpatient  Status        Time spent:  [facility] Pager [phone]   If [place] Please Contact the Day Rounding Team MD for [facility]  If 7PM-7AM, Please Contact Night-Floor Coverage  [email or web address] Password [email or web address] 01/03/2015, 9:23 PM     ADDENDUM:   Patient was seen and examined on 01/03/2015

## 2015-01-04 ENCOUNTER — Ambulatory Visit (HOSPITAL_COMMUNITY): Admission: RE | Admit: 2015-01-04 | Payer: Medicare (Managed Care) | Source: Ambulatory Visit

## 2015-01-04 ENCOUNTER — Telehealth: Payer: Self-pay | Admitting: Family Medicine

## 2015-01-04 ENCOUNTER — Inpatient Hospital Stay (HOSPITAL_COMMUNITY): Payer: Medicare Other

## 2015-01-04 ENCOUNTER — Encounter (HOSPITAL_COMMUNITY): Payer: Self-pay | Admitting: Radiology

## 2015-01-04 DIAGNOSIS — E876 Hypokalemia: Secondary | ICD-10-CM | POA: Diagnosis present

## 2015-01-04 DIAGNOSIS — R911 Solitary pulmonary nodule: Secondary | ICD-10-CM | POA: Diagnosis present

## 2015-01-04 DIAGNOSIS — E43 Unspecified severe protein-calorie malnutrition: Secondary | ICD-10-CM | POA: Insufficient documentation

## 2015-01-04 LAB — BASIC METABOLIC PANEL
ANION GAP: 13 (ref 5–15)
BUN: 6 mg/dL (ref 6–20)
CALCIUM: 9.1 mg/dL (ref 8.9–10.3)
CO2: 22 mmol/L (ref 22–32)
Chloride: 104 mmol/L (ref 101–111)
Creatinine, Ser: 0.76 mg/dL (ref 0.44–1.00)
Glucose, Bld: 106 mg/dL — ABNORMAL HIGH (ref 65–99)
Potassium: 3 mmol/L — ABNORMAL LOW (ref 3.5–5.1)
Sodium: 139 mmol/L (ref 135–145)

## 2015-01-04 LAB — CBC
HCT: 41.2 % (ref 36.0–46.0)
HEMOGLOBIN: 13.9 g/dL (ref 12.0–15.0)
MCH: 31.5 pg (ref 26.0–34.0)
MCHC: 33.7 g/dL (ref 30.0–36.0)
MCV: 93.4 fL (ref 78.0–100.0)
Platelets: 353 10*3/uL (ref 150–400)
RBC: 4.41 MIL/uL (ref 3.87–5.11)
RDW: 13.7 % (ref 11.5–15.5)
WBC: 6 10*3/uL (ref 4.0–10.5)

## 2015-01-04 LAB — GLUCOSE, CAPILLARY
GLUCOSE-CAPILLARY: 128 mg/dL — AB (ref 65–99)
GLUCOSE-CAPILLARY: 309 mg/dL — AB (ref 65–99)
Glucose-Capillary: 363 mg/dL — ABNORMAL HIGH (ref 65–99)

## 2015-01-04 MED ORDER — PNEUMOCOCCAL VAC POLYVALENT 25 MCG/0.5ML IJ INJ
0.5000 mL | INJECTION | INTRAMUSCULAR | Status: AC
  Administered 2015-01-05: 0.5 mL via INTRAMUSCULAR
  Filled 2015-01-04: qty 0.5

## 2015-01-04 MED ORDER — INFLUENZA VAC SPLIT QUAD 0.5 ML IM SUSY
0.5000 mL | PREFILLED_SYRINGE | INTRAMUSCULAR | Status: AC
  Administered 2015-01-05: 0.5 mL via INTRAMUSCULAR
  Filled 2015-01-04: qty 0.5

## 2015-01-04 MED ORDER — IOHEXOL 300 MG/ML  SOLN
80.0000 mL | Freq: Once | INTRAMUSCULAR | Status: AC | PRN
  Administered 2015-01-04: 80 mL via INTRAVENOUS

## 2015-01-04 MED ORDER — INSULIN ASPART 100 UNIT/ML ~~LOC~~ SOLN
0.0000 [IU] | Freq: Three times a day (TID) | SUBCUTANEOUS | Status: DC
  Administered 2015-01-04: 15 [IU] via SUBCUTANEOUS
  Administered 2015-01-05: 11 [IU] via SUBCUTANEOUS
  Administered 2015-01-05 (×2): 2 [IU] via SUBCUTANEOUS
  Administered 2015-01-06: 3 [IU] via SUBCUTANEOUS
  Administered 2015-01-06 – 2015-01-07 (×2): 2 [IU] via SUBCUTANEOUS
  Administered 2015-01-07: 5 [IU] via SUBCUTANEOUS
  Administered 2015-01-07: 2 [IU] via SUBCUTANEOUS
  Administered 2015-01-08: 5 [IU] via SUBCUTANEOUS
  Administered 2015-01-09: 2 [IU] via SUBCUTANEOUS
  Administered 2015-01-09: 3 [IU] via SUBCUTANEOUS
  Administered 2015-01-10: 0 [IU] via SUBCUTANEOUS
  Administered 2015-01-10: 3 [IU] via SUBCUTANEOUS
  Administered 2015-01-11: 5 [IU] via SUBCUTANEOUS
  Administered 2015-01-12: 2 [IU] via SUBCUTANEOUS
  Filled 2015-01-04: qty 1

## 2015-01-04 MED ORDER — PANTOPRAZOLE SODIUM 40 MG PO TBEC
40.0000 mg | DELAYED_RELEASE_TABLET | Freq: Every day | ORAL | Status: DC
  Administered 2015-01-04 – 2015-01-11 (×8): 40 mg via ORAL
  Filled 2015-01-04 (×8): qty 1

## 2015-01-04 MED ORDER — IOHEXOL 300 MG/ML  SOLN
75.0000 mL | Freq: Once | INTRAMUSCULAR | Status: AC | PRN
  Administered 2015-01-04: 75 mL via INTRAVENOUS

## 2015-01-04 MED ORDER — IOHEXOL 300 MG/ML  SOLN: 25.0000 mL | INTRAMUSCULAR | Status: AC

## 2015-01-04 MED ORDER — POTASSIUM CHLORIDE CRYS ER 20 MEQ PO TBCR
40.0000 meq | EXTENDED_RELEASE_TABLET | Freq: Two times a day (BID) | ORAL | Status: AC
  Administered 2015-01-04 (×2): 40 meq via ORAL
  Filled 2015-01-04 (×2): qty 2

## 2015-01-04 MED ORDER — LEVOFLOXACIN IN D5W 750 MG/150ML IV SOLN
750.0000 mg | INTRAVENOUS | Status: DC
  Administered 2015-01-04: 750 mg via INTRAVENOUS
  Filled 2015-01-04 (×2): qty 150

## 2015-01-04 NOTE — Care Management Note (Signed)
Case Management Note  Patient Details  Name: Kristin Griffin MRN: [id number] Date of Birth: 01/18/1951  Subjective/Objective:                 Spoke with patient, son, and daughter in law at the bedside. Patient comes from home, lives alone due to increased weakness to right side. CM requested PT and Palliative consult order from nurse, offered to call MD; RN stated he would get order. Patient's husband [name] passed away at [facility] on 12-28-14. Patient very emotional, states she has not received his ashes yet, she states she has not had time to process all that is going on. CM offered Chaplain consult, patient declined. Patient found to have lesions to lung and brain, new CA diagnosis. Patient stated that she would want to be involved in making decisions about where she was discharged to. CM reviewed with patient and family that we would have PT work with her to evaluate how much support she needed to manage her ADL's at home. PT would recommend if they thought she needed therapy at a SNF, or if she could be managed at home. Explained in detail to patient and family the amount of therapy that would be received at home vs at a facility. Reassured that she is able to decide her disposition and that the CM and SW will work with her and her family to make sure a plan, she is a part of making, is in place and that is safe. Patient states that she has a cane, a walker, and a tub bench at home, and has never used a home health company. CM told patient and family that there are several companies to choose from and if she goes home we can also offer resources for private duty aids, and these would be self pay. Family and patient appreciative of time spent with them.  Action/Plan:  CM will continue to follow and watch for prognosis and disposition plan. SW also following.  Expected Discharge Date:  01/06/15               Expected Discharge Plan:  Skilled Nursing Facility  In-House Referral:  Clinical Social  Work  Discharge planning Services  CM Consult  Post Acute Care Choice:    Choice offered to:     DME Arranged:    DME Agency:     HH Arranged:    [place] Agency:     Status of Service:  In process, will continue to follow  Medicare Important Message Given:    Date Medicare IM Given:    Medicare IM give by:    Date Additional Medicare IM Given:    Additional Medicare Important Message give by:     If discussed at [place] of Stay Meetings, dates discussed:    Additional Comments:  Carles Collet, RN 01/04/2015, 3:02 PM

## 2015-01-04 NOTE — Progress Notes (Signed)
[facility] PROGRESS NOTE  Kristin Griffin WGY:[id number] DOB: 17-Dec-1950 DOA: 01/03/2015 PCP: Delman Cheadle, MD INTERIM SUMMARY: Kristin Griffin is a 64 y.o. female with a history of HTN who presents to the ED with complaints of Right sided Weakness and progressive decline for the past 3 weeks. ct head reveals left parietal lobe and right cerebellar hemisphere with white matter edema. It was followed by a CT chest with contrast to evaluate for a primary.  CT chest showed multiple pulmonary nodules.  CT abdomen and pelvis ordered.   Assessment/Plan: 1. Metastatic disease with unknown primary: - getting a CT abd and pelvis for eval of primary.  - IR consulted to for biopsy of the pulm nodule.  - IV decadron for white matter edema.  - oncology consulted.   2. Right sided weakness:  PT eval.   3. Hypertension: controlled.   4. Hypokalemia: replete as needed.   Code Status: full code.  Family Communication: daughter at bedside.  Disposition Plan:pending.    Consultants:  Oncology.   IR.   Procedures: CT chest with contrast.  CT abdomen and pelvis.   Antibiotics:  levaquin   HPI/Subjective: Reports breathing is better.   Objective: Filed Vitals:   01/04/15 1446  BP: 102/68  Pulse: 103  Temp: 98 F (36.7 C)  Resp: 16    Intake/Output Summary (Last 24 hours) at 01/04/15 1831 Last data filed at 01/04/15 1632  Gross per 24 hour  Intake   1660 ml  Output   1150 ml  Net    510 ml   Filed Weights   01/03/15 2204  Weight: 47.7 kg (105 lb 2.6 oz)    Exam:   General:  Alert afebrile comfortable.   Cardiovascular: s1s2  Respiratory: ctab, no wheezing or rhonchi  Abdomen: soft non tender non distended bowel sounds are good.   Musculoskeletal: no pedal edema.   Data Reviewed: Basic Metabolic Panel:  Recent Labs Lab 01/01/15 1508 01/03/15 1819 01/03/15 1841 01/04/15 0440  NA 142 138 140 139  K 4.6 3.3* 3.4* 3.0*  CL 98 103 103 104  CO2 28 23   --  22  GLUCOSE 92 101* 102* 106*  BUN '9 9 11 6  '$ CREATININE 0.73 0.79 0.80 0.76  CALCIUM 10.2 9.6  --  9.1   Liver Function Tests:  Recent Labs Lab 01/01/15 1508 01/03/15 1819  AST 20 23  ALT 12 13*  ALKPHOS 99 95  BILITOT 0.7 0.7  PROT 6.7 6.6  ALBUMIN 3.8 3.5   No results for input(s): LIPASE, AMYLASE in the last 168 hours. No results for input(s): AMMONIA in the last 168 hours. CBC:  Recent Labs Lab 01/01/15 1524 01/03/15 1819 01/03/15 1841 01/04/15 0440  WBC 7.2 9.0  --  6.0  NEUTROABS  --  6.3  --   --   HGB 14.9 15.0 16.3* 13.9  HCT 45.5 42.9 48.0* 41.2  MCV 93.7 92.9  --  93.4  PLT  --  380  --  353   Cardiac Enzymes:  Recent Labs Lab 01/03/15 1819  CKTOTAL 43   BNP (last 3 results) No results for input(s): BNP in the last 8760 hours.  ProBNP (last 3 results) No results for input(s): PROBNP in the last 8760 hours.  CBG:  Recent Labs Lab 01/04/15 0911 01/04/15 1222 01/04/15 1656  GLUCAP 128* 309* 363*    Recent Results (from the past 240 hour(s))  Urine culture     Status: None (Preliminary result)  Collection Time: 01/03/15  8:10 PM  Result Value Ref Range Status   Specimen Description URINE, CLEAN CATCH  Final   Special Requests NONE  Final   Culture CULTURE REINCUBATED FOR BETTER GROWTH  Final   Report Status PENDING  Incomplete     Studies: Dg Chest 2 View  01/03/2015   CLINICAL DATA:  Initial evaluation for acute right-sided weakness.  EXAM: CHEST  2 VIEW  COMPARISON:  None.  FINDINGS: Transverse heart size at the upper limits of normal. Mediastinal silhouette within normal limits. Atheromatous plaque present within the aortic arch.  Lungs are mildly hyperinflated with attenuation of the pulmonary markings, consistent with emphysema. Minimal left basilar atelectasis/scarring. Similarly, probable parenchymal scarring within the peripheral right lower lobe. No focal infiltrate, pulmonary edema, or pleural effusion. No pneumothorax.  There is a 15 mm nodular and somewhat lobulated density overlying the right upper lobe, indeterminate.  Osteopenia noted.  No acute osseus abnormality.  IMPRESSION: 1. Emphysema.  No superimposed active cardiopulmonary disease. 2. 14 mm nodular density within the right upper lobe. Follow-up examination with cross-sectional imaging of the chest is recommended for further evaluation.   Electronically Signed   By: Jeannine Boga M.D.   On: 01/03/2015 21:39   Ct Head Wo Contrast  01/03/2015   CLINICAL DATA:  Right-sided hemiparesis.  EXAM: CT HEAD WITHOUT CONTRAST  TECHNIQUE: Contiguous axial images were obtained from the base of the skull through the vertex without intravenous contrast.  COMPARISON:  None.  FINDINGS: 2.5 x 2.1 cm mass is noted in the left centrum semiovale with surrounding white matter edema. Also noted is 2.3 x 1.4 cm mass in right cerebellar hemisphere with surrounding white matter edema. These findings most consistent with metastatic disease. Ventricular size is within normal limits. No significant midline shift is seen at this time. No definite hemorrhage is noted. Multiple small lucencies are noted in the bony calvarium which potentially may represent metastatic disease.  IMPRESSION: Masses with surrounding white matter edema are noted in the left parietal lobe and right cerebellar hemisphere most consistent with metastatic disease. Also noted are multiple small lucencies in the bony calvarium which may represent metastatic disease. MRI with and without gadolinium is recommended for further evaluation.   Electronically Signed   By: Marijo Conception, M.D.   On: 01/03/2015 19:40   Ct Chest W Contrast  01/04/2015   ADDENDUM REPORT: 01/04/2015 10:31  ADDENDUM: There is atherosclerotic change in aorta but no aneurysm. No thoracic aortic aneurysm or dissection. No pulmonary embolus appreciable. There are scattered foci of coronary artery calcification.   Electronically Signed   By: Lowella Grip III M.D.   On: 01/04/2015 10:31   01/04/2015   CLINICAL DATA:  Pulmonary nodular lesion on chest radiograph  EXAM: CT CHEST WITH CONTRAST  TECHNIQUE: Multidetector CT imaging of the chest was performed during intravenous contrast administration.  CONTRAST:  21m OMNIPAQUE IOHEXOL 300 MG/ML  SOLN  COMPARISON:  Chest radiograph January 03, 2015  FINDINGS: There is underlying centrilobular emphysematous change. There is an irregular opacity with mild adjacent peribronchial thickening in the right apex measuring 1.4 x 0.6 cm, best seen on axial slice 9 series 2086 There is a lobular appearing nodular lesion in the posterior segment of the right upper lobe measuring 1.7 x 1.3 cm, best seen on axial slice 20 series 2761 There is a lobular appearing nodule also in the posterior segment of the right upper lobe measuring 1.2 x 1.2 cm, best seen  on axial slice 26 series 254. There is a nodular lesion abutting the minor fissure in the posterior segment left upper lobe on slice 30 series 270 measuring 0.9 x 0.9 cm. There is a nodular lesion in the superior segment of the right lower lobe seen on axial slice 36 series 623 measuring 1.2 x 1.2 cm. There is an irregular nodular lesion in the superior segment of the right lower lobe measuring 1.2 x 1.1 cm on axial slice 38 series 762. There is an irregular spiculated lesion in the lateral segment of the right middle lobe measuring 2.8 x 2.3 cm. This lesion is best seen on axial slice 40 series 831. There is an area of consolidation in the posterior segment of the right lower lobe. There are scattered areas of presumed scarring in the lungs bilaterally.  There is a nodular lesion in the right lobe of the thyroid measuring 1.7 x 0.7 cm. There is a nodular lesion arising from the isthmus of the thyroid extending toward the left measuring 2.5 x 1.0 cm.  There are prominent right hilar lymph nodes. The largest individual lymph node measures 1.7 x 1.1 cm. A second right hilar  lymph node measures 1.3 x 1.2 cm. No other adenopathy is appreciable.  There is left ventricular hypertrophy. There is calcification in multiple coronary artery regions. Pericardium is not thickened.  Visualized upper abdomen, the adrenals appear unremarkable. There is atherosclerotic change in aorta. There is equivocal hepatic steatosis.  There are no blastic or lytic bone lesions. There is degenerative change in the thoracic spine with areas of endplate concavity at several levels, likely due to underlying osteoporosis.  IMPRESSION: Underlying emphysema. Multiple pulmonary nodular lesions, largest in the right middle lobe measuring 2.8 x 2.3 cm. Multifocal neoplasm is of concern. Advise PET-CT to further evaluate.  Consolidation right base posteriorly. This appearance by CT is more consistent with pneumonia than neoplasm, although a followup study in approximately 4 weeks to further evaluate this area may well be warranted.  Right hilar adenopathy.  Thyroid nodular lesions as noted above. Advise thyroid ultrasound to further evaluate.  These results will be called to the ordering clinician or representative by the Radiologist Assistant, and communication documented in the PACS or zVision Dashboard.  Electronically Signed: By: Lowella Grip III M.D. On: 01/04/2015 10:26    Scheduled Meds: . calcium carbonate  1 tablet Oral Q breakfast  . dexamethasone  10 mg Intravenous 4 times per day  . feeding supplement (ENSURE ENLIVE)  237 mL Oral BID BM  . [START ON 01/05/2015] Influenza vac split quadrivalent PF  0.5 mL Intramuscular Tomorrow-1000  . insulin aspart  0-15 Units Subcutaneous TID WC  . levofloxacin (LEVAQUIN) IV  750 mg Intravenous Q24H  . multivitamin with minerals  1 tablet Oral Daily  . omega-3 acid ethyl esters  1 g Oral Daily  . pantoprazole  40 mg Oral QHS  . [START ON 01/05/2015] pneumococcal 23 valent vaccine  0.5 mL Intramuscular Tomorrow-1000  . potassium chloride  40 mEq Oral BID  .  sodium chloride  3 mL Intravenous Q12H  . verapamil  240 mg Oral QHS   Continuous Infusions:   Principal Problem:   Brain mass Active Problems:   Hypertension   Osteoporosis   Right sided weakness   Lung nodule   Hypokalemia   Protein-calorie malnutrition, severe    Time spent: 40 min    [facility] Pager [phone]. If 7PM-7AM, please contact night-coverage at [email or web address],  password [facility] 01/04/2015, 6:31 PM  LOS: 1 day

## 2015-01-04 NOTE — Clinical Social Work Note (Signed)
Covering CSW continuing to follow patient pending medical work up completion.  Lubertha Sayres, [place] Clinical Social Work Department Orthopedics [phone]) and Surgical [phone])

## 2015-01-04 NOTE — Progress Notes (Signed)
Contrast Dye received and patient began drinking first of two bottles at 1915 as labeled. Patient verbalized she is to drink each bottle over the course of 1 hour. RN Verdene Lennert ware.

## 2015-01-04 NOTE — Progress Notes (Signed)
MD Karleen Hampshire paged, patient blood sugar 309.

## 2015-01-04 NOTE — Progress Notes (Signed)
Initial Nutrition Assessment  DOCUMENTATION CODES:   Severe malnutrition in context of chronic illness, Underweight  INTERVENTION:   Continue Ensure Enlive po BID, each supplement provides 350 kcal and 20 grams of protein.  Encourage adequate intake.   NUTRITION DIAGNOSIS:   Malnutrition related to chronic illness as evidenced by percent weight loss, severe depletion of body fat, severe depletion of muscle mass.  GOAL:   Patient will meet greater than or equal to 90% of their needs  MONITOR:   PO intake, Supplement acceptance, Weight trends, Labs, I & O's  REASON FOR ASSESSMENT:   Malnutrition Screening Tool    ASSESSMENT:   64 y.o. female with a history of HTN who presents to the ED with complaints of Right sided Weakness and progressive decline for the past 3 weeks.  She report having an unintentional weight loss of 10 pounds over the past 5 weeks. She denies any fevers chills or night sweats. Her husband just passed away 1 week ago. Her brothers noticed that sh was walking and dragging her right leg and brought her to the ED. In the ED, a Head Ct was performed and revealed a 2.5 X 2.1 cm mass in the Left Parietal Lobe, and a 2.3 x 1.4 cm mass in the Right Cerebellar Hemisphere consistent with metastatic disease. A Chest X-ray was performed and revealed a RUL nodule  Meal completion has been 25-75%. Pt reports having a decreased appetite over the past 6 months with consumption of at least 1-2 meals a day. Usual body weight reported to be ~130 lbs last weighed 6 months ago. Pt with a 19% weight los in 6 months. Pt currently has Ensure ordered and has been consuming them. RD to continue with current orders. Pt was educated to continue nutritional supplementation at home if po intake continues to be poor.   Nutrition-Focused physical exam completed. Findings are severe fat depletion, severe muscle depletion, and no edema.   Labs and medications reviewed.   Diet Order:   Diet Carb Modified Fluid consistency:: Thin; Room service appropriate?: Yes  Skin:  Reviewed, no issues  Last BM:  9/11  Height:   Ht Readings from Last 1 Encounters:  01/03/15 '5\' 6"'$  (1.676 m)    Weight:   Wt Readings from Last 1 Encounters:  01/03/15 105 lb 2.6 oz (47.7 kg)    Ideal Body Weight:  59 kg  BMI:  Body mass index is 16.98 kg/(m^2).  Estimated Nutritional Needs:   Kcal:  1750-2000  Protein:  75-85 grams  Fluid:  1.7 - 2 L/day  EDUCATION NEEDS:   Education needs addressed  Corrin Parker, MS, RD, LDN Pager # [phone] After hours/ weekend pager # [phone]

## 2015-01-04 NOTE — Telephone Encounter (Signed)
MRI tech called from [facility] because patient was scheduled for MRI at 8am(canceled) because she was admitted to [facility] yesterday evening for R side weakness and progressive decline. They performed CT Head (abnormal see report). Radiologist recommended MRI so it will be done as inpatient at [facility]. She just wanted to let you know. She also said you could call and speak to hospitalist if needed.  [facility]ist: Jana Hakim   Patient room # [phone]

## 2015-01-04 NOTE — Telephone Encounter (Signed)
Hospital record shows work up in progress.

## 2015-01-04 NOTE — Progress Notes (Signed)
MD Karleen Hampshire paged, patient CT of Chest has resulted.

## 2015-01-04 NOTE — Progress Notes (Signed)
Patient ID: IRISH BREISCH, female   DOB: 1950-07-22, 64 y.o.   MRN: [id number]   Request for Rt lung mass bx received in IR  Imaging reviewed with Dr Kathlene Cote Rt lung mass difficult Rec: PET          CT Ab/Pelvis with Cx for completion of work up  Will HOLD on any bx at this time Await PET and CTs for review Would be able to choose best target for tissue diagnosis  I will contact MD

## 2015-01-05 ENCOUNTER — Inpatient Hospital Stay (HOSPITAL_COMMUNITY): Payer: Medicare Other

## 2015-01-05 ENCOUNTER — Encounter: Payer: Self-pay | Admitting: Family Medicine

## 2015-01-05 DIAGNOSIS — E876 Hypokalemia: Secondary | ICD-10-CM

## 2015-01-05 DIAGNOSIS — E46 Unspecified protein-calorie malnutrition: Secondary | ICD-10-CM

## 2015-01-05 DIAGNOSIS — R531 Weakness: Secondary | ICD-10-CM

## 2015-01-05 DIAGNOSIS — I10 Essential (primary) hypertension: Secondary | ICD-10-CM

## 2015-01-05 DIAGNOSIS — E43 Unspecified severe protein-calorie malnutrition: Secondary | ICD-10-CM

## 2015-01-05 DIAGNOSIS — C801 Malignant (primary) neoplasm, unspecified: Secondary | ICD-10-CM

## 2015-01-05 DIAGNOSIS — C3490 Malignant neoplasm of unspecified part of unspecified bronchus or lung: Secondary | ICD-10-CM | POA: Diagnosis not present

## 2015-01-05 DIAGNOSIS — R918 Other nonspecific abnormal finding of lung field: Secondary | ICD-10-CM

## 2015-01-05 DIAGNOSIS — R9402 Abnormal brain scan: Secondary | ICD-10-CM

## 2015-01-05 DIAGNOSIS — M810 Age-related osteoporosis without current pathological fracture: Secondary | ICD-10-CM

## 2015-01-05 DIAGNOSIS — N289 Disorder of kidney and ureter, unspecified: Secondary | ICD-10-CM

## 2015-01-05 DIAGNOSIS — Z720 Tobacco use: Secondary | ICD-10-CM

## 2015-01-05 LAB — GLUCOSE, CAPILLARY
GLUCOSE-CAPILLARY: 133 mg/dL — AB (ref 65–99)
GLUCOSE-CAPILLARY: 307 mg/dL — AB (ref 65–99)
GLUCOSE-CAPILLARY: 105 mg/dL — AB (ref 65–99)
Glucose-Capillary: 128 mg/dL — ABNORMAL HIGH (ref 65–99)
Glucose-Capillary: 131 mg/dL — ABNORMAL HIGH (ref 65–99)

## 2015-01-05 LAB — BASIC METABOLIC PANEL
ANION GAP: 8 (ref 5–15)
BUN: 11 mg/dL (ref 6–20)
CO2: 24 mmol/L (ref 22–32)
Calcium: 9.6 mg/dL (ref 8.9–10.3)
Chloride: 105 mmol/L (ref 101–111)
Creatinine, Ser: 0.73 mg/dL (ref 0.44–1.00)
GLUCOSE: 139 mg/dL — AB (ref 65–99)
POTASSIUM: 4.5 mmol/L (ref 3.5–5.1)
Sodium: 137 mmol/L (ref 135–145)

## 2015-01-05 LAB — HEMOGLOBIN A1C
Hgb A1c MFr Bld: 5.2 % (ref 4.8–5.6)
Mean Plasma Glucose: 103 mg/dL

## 2015-01-05 LAB — URINE CULTURE

## 2015-01-05 MED ORDER — HEPARIN SODIUM (PORCINE) 5000 UNIT/ML IJ SOLN
5000.0000 [IU] | Freq: Three times a day (TID) | INTRAMUSCULAR | Status: DC
  Administered 2015-01-05 – 2015-01-12 (×22): 5000 [IU] via SUBCUTANEOUS
  Filled 2015-01-05 (×22): qty 1

## 2015-01-05 MED ORDER — GADOBENATE DIMEGLUMINE 529 MG/ML IV SOLN
10.0000 mL | Freq: Once | INTRAVENOUS | Status: AC | PRN
  Administered 2015-01-05: 10 mL via INTRAVENOUS

## 2015-01-05 MED ORDER — INSULIN ASPART 100 UNIT/ML ~~LOC~~ SOLN
3.0000 [IU] | Freq: Three times a day (TID) | SUBCUTANEOUS | Status: DC
  Administered 2015-01-05 – 2015-01-12 (×15): 3 [IU] via SUBCUTANEOUS
  Filled 2015-01-05: qty 1

## 2015-01-05 MED ORDER — TECHNETIUM TC 99M MEDRONATE IV KIT
25.0000 | PACK | Freq: Once | INTRAVENOUS | Status: AC | PRN
  Administered 2015-01-05: 25 via INTRAVENOUS

## 2015-01-05 MED ORDER — LEVOFLOXACIN 750 MG PO TABS
750.0000 mg | ORAL_TABLET | Freq: Every day | ORAL | Status: DC
  Administered 2015-01-05 – 2015-01-10 (×5): 750 mg via ORAL
  Filled 2015-01-05 (×5): qty 1

## 2015-01-05 MED ORDER — DEXAMETHASONE SODIUM PHOSPHATE 10 MG/ML IJ SOLN
4.0000 mg | Freq: Four times a day (QID) | INTRAMUSCULAR | Status: DC
  Administered 2015-01-05 – 2015-01-12 (×26): 4 mg via INTRAVENOUS
  Filled 2015-01-05 (×27): qty 1

## 2015-01-05 NOTE — Evaluation (Signed)
Physical Therapy Evaluation Patient Details Name: Kristin Griffin MRN: [id number] DOB: [birth year] Today's Date: 01/05/2015   History of Present Illness  64 y.o. female presents with complaints of Right sided Weakness and progressive decline for the past 3 weeks.  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  Clinical Impression  Patient demonstrates deficits in functional mobility as indicated below. Will need continued skilled PT to address deficits and maximize function. Will see as indicated and progress as tolerated.  Patient demonstrates deficits in right side, but does demonstrate receptiveness and carryover with educational instruction for both gait and functional tasks. Patient improved from 2 person moderate assist to 1 person moderate assist with manual facilitation of weight shifts during gait. Feel patient would benefit significantly from CIR consult for recovery of function upon acute discharge.    Follow Up Recommendations CIR;Supervision/Assistance - 24 hour    Equipment Recommendations  Other (comment) (TBD)    Recommendations for Other Services Rehab consult     Precautions / Restrictions Precautions Precautions: Fall Restrictions Weight Bearing Restrictions: No      Mobility  Bed Mobility Overal bed mobility: Needs Assistance Bed Mobility: Supine to Sit;Sit to Supine     Supine to sit: Min assist Sit to supine: Min assist   General bed mobility comments: increased time and effort to come to EOB, VCs for positioning. Educated on use of LLE to compensate for RLE deficits for elevation of LEs back to bed.   Transfers Overall transfer level: Needs assistance Equipment used: 1 person hand held assist Transfers: Sit to/from Stand Sit to Stand: Min  assist         General transfer comment: VCs for hand placement, no attention to RUE during transfer, Wrap around support and use of gait belt to initiate and carry out standing, increased assist for stability in standing. Performed from bed with 2 persons and from toilet with 1 person assist and LUE using grab bar to power up.  Ambulation/Gait Ambulation/Gait assistance: Mod assist Ambulation Distance (Feet): 24 Feet Assistive device: 1 person hand held assist (full wrap around support with gait belt) Gait Pattern/deviations: Step-to pattern;Decreased stride length;Decreased dorsiflexion - right;Decreased weight shift to left;Staggering right;Trunk flexed;Narrow base of support Gait velocity: decreased Gait velocity interpretation: Below normal speed for age/gender General Gait Details: patient with difficulty advancing RLE, imporved with some faciliation of weight shift. patient with circumduction compensation but inability to clear RLE.   Stairs            Wheelchair Mobility    Modified Rankin (Stroke Patients Only) Modified Rankin (Stroke Patients Only) Pre-Morbid Rankin Score: No symptoms Modified Rankin: Moderately severe disability     Balance Overall balance assessment: Needs assistance;History of Falls Sitting-balance support: Feet supported;Single extremity supported (no attention to RUE) Sitting balance-Leahy Scale: Fair Sitting balance - Comments: felxed sitting posture, Cues to attend to RUE for support.   Standing balance support: During functional activity;Bilateral upper extremity supported Standing balance-Leahy Scale: Poor Standing balance comment: patient with inability to maintain static standing without physical assist due to RLE deficits                             Pertinent Vitals/Pain Pain Assessment: No/denies pain  Home Living Family/patient expects to be discharged to:: Private residence Living Arrangements: Alone Available  Help at Discharge: Family Type of Home: House Home Access: Stairs to enter   Technical brewer of Steps: 6 Home Layout: Laundry or work area in basement;Able to live on main level with bedroom/bathroom Home Equipment: Kasandra Knudsen - single point;Walker - 2 wheels      Prior Function Level of Independence: Independent (was taking care of ill husband )               Hand Dominance   Dominant Hand: Right    Extremity/Trunk Assessment   Upper Extremity Assessment: Defer to OT evaluation (RUE deficits noted)           Lower Extremity Assessment: Generalized weakness;RLE deficits/detail RLE Deficits / Details: noted weakness in dorsiflexion, hip flexion gross assessment 3-/5       Communication   Communication: No difficulties  Cognition Arousal/Alertness: Awake/alert Behavior During Therapy: WFL for tasks assessed/performed Overall Cognitive Status: Impaired/Different from baseline Area of Impairment: Safety/judgement;Awareness;Problem solving;Attention   Current Attention Level: Selective     Safety/Judgement: Decreased awareness of safety Awareness: Emergent Problem Solving: Slow processing;Difficulty sequencing;Requires verbal cues;Requires tactile cues General Comments: inattention to right side    General Comments      Exercises        Assessment/Plan    PT Assessment Patient needs continued PT services  PT Diagnosis Difficulty walking;Abnormality of gait;Altered mental status   PT Problem List Decreased strength;Decreased range of motion;Decreased activity tolerance;Decreased balance;Decreased mobility;Decreased coordination;Decreased safety awareness  PT Treatment Interventions DME instruction;Gait training;Stair training;Functional mobility training;Therapeutic activities;Therapeutic exercise;Balance training;Cognitive remediation;Patient/family education   PT Goals (Current goals can be found in the Care Plan section) Acute Rehab PT Goals Patient  Stated Goal: to get better PT Goal Formulation: With patient/family Time For Goal Achievement: 01/19/15 Potential to Achieve Goals: Good    Frequency Min 3X/week   Barriers to discharge        Co-evaluation               End of Session Equipment Utilized During Treatment: Gait belt Activity Tolerance: Patient tolerated treatment well Patient left: in bed;with family/visitor present (transport in room to take to MRI) Nurse Communication: Mobility status;Precautions         Time: 4627-0350 PT Time Calculation (min) (ACUTE ONLY): 17 min   Charges:   PT Evaluation $Initial PT Evaluation Tier I: 1 Procedure     PT G CodesDuncan Dull 01/08/2015, 2:00 PM Alben Deeds, [place] DPT  [phone]

## 2015-01-05 NOTE — Consult Note (Signed)
Reason for Consult: Brain tumors Referring Physician: [name] is an 64 y.o. female.  HPI: 64 year old female presents with a 2 month history of progressive right-sided weakness. Symptoms have affected her right lower extremity greater then her upper extremity. No known history of cancer. Patient a former smoker. No seizure. No history of trauma.  Past Medical History  Diagnosis Date  . Hypertension     Past Surgical History  Procedure Laterality Date  . Abdominal hysterectomy      History reviewed. No pertinent family history.  Social History:  reports that she quit smoking about 14 months ago. She does not have any smokeless tobacco history on file. She reports that she drinks alcohol. She reports that she does not use illicit drugs.  Allergies: No Known Allergies  Medications: I have reviewed the patient's current medications.  Results for orders placed or performed during the hospital encounter of 01/03/15 (from the past 48 hour(s))  Urinalysis, Routine w reflex microscopic     Status: Abnormal   Collection Time: 01/03/15  8:10 PM  Result Value Ref Range   Color, Urine YELLOW YELLOW   APPearance CLEAR CLEAR   Specific Gravity, Urine 1.012 1.005 - 1.030   pH 6.0 5.0 - 8.0   Glucose, UA NEGATIVE NEGATIVE mg/dL   Hgb urine dipstick NEGATIVE NEGATIVE   Bilirubin Urine NEGATIVE NEGATIVE   Ketones, ur 15 (A) NEGATIVE mg/dL   Protein, ur NEGATIVE NEGATIVE mg/dL   Urobilinogen, UA 0.2 0.0 - 1.0 mg/dL   Nitrite NEGATIVE NEGATIVE   Leukocytes, UA SMALL (A) NEGATIVE  Urine culture     Status: None   Collection Time: 01/03/15  8:10 PM  Result Value Ref Range   Specimen Description URINE, CLEAN CATCH    Special Requests NONE    Culture MULTIPLE SPECIES PRESENT, SUGGEST RECOLLECTION    Report Status 01/05/2015 FINAL   Urine microscopic-add on     Status: Abnormal   Collection Time: 01/03/15  8:10 PM  Result Value Ref Range   WBC, UA 7-10 <3 WBC/hpf   RBC / HPF  0-2 <3 RBC/hpf   Bacteria, UA RARE RARE   Casts HYALINE CASTS (A) NEGATIVE   Crystals CA OXALATE CRYSTALS (A) NEGATIVE  Basic metabolic panel     Status: Abnormal   Collection Time: 01/04/15  4:40 AM  Result Value Ref Range   Sodium 139 135 - 145 mmol/L   Potassium 3.0 (L) 3.5 - 5.1 mmol/L   Chloride 104 101 - 111 mmol/L   CO2 22 22 - 32 mmol/L   Glucose, Bld 106 (H) 65 - 99 mg/dL   BUN 6 6 - 20 mg/dL   Creatinine, Ser 0.76 0.44 - 1.00 mg/dL   Calcium 9.1 8.9 - 10.3 mg/dL   GFR calc non Af Amer >60 >60 mL/min   GFR calc Af Amer >60 >60 mL/min    Comment: (NOTE) The eGFR has been calculated using the CKD EPI equation. This calculation has not been validated in all clinical situations. eGFR's persistently <60 mL/min signify possible Chronic Kidney Disease.    Anion gap 13 5 - 15  CBC     Status: None   Collection Time: 01/04/15  4:40 AM  Result Value Ref Range   WBC 6.0 4.0 - 10.5 K/uL   RBC 4.41 3.87 - 5.11 MIL/uL   Hemoglobin 13.9 12.0 - 15.0 g/dL   HCT 41.2 36.0 - 46.0 %   MCV 93.4 78.0 - 100.0 fL  MCH 31.5 26.0 - 34.0 pg   MCHC 33.7 30.0 - 36.0 g/dL   RDW 13.7 11.5 - 15.5 %   Platelets 353 150 - 400 K/uL  Hemoglobin A1c     Status: None   Collection Time: 01/04/15  4:40 AM  Result Value Ref Range   Hgb A1c MFr Bld 5.2 4.8 - 5.6 %    Comment: (NOTE)         Pre-diabetes: 5.7 - 6.4         Diabetes: >6.4         Glycemic control for adults with diabetes: <7.0    Mean Plasma Glucose 103 mg/dL    Comment: (NOTE) Performed At: [facility] [place], [place] [id number] Lindon Romp MD VP:[id number]   Glucose, capillary     Status: Abnormal   Collection Time: 01/04/15  9:11 AM  Result Value Ref Range   Glucose-Capillary 128 (H) 65 - 99 mg/dL  Glucose, capillary     Status: Abnormal   Collection Time: 01/04/15 12:22 PM  Result Value Ref Range   Glucose-Capillary 309 (H) 65 - 99 mg/dL  Glucose, capillary     Status: Abnormal   Collection  Time: 01/04/15  4:56 PM  Result Value Ref Range   Glucose-Capillary 363 (H) 65 - 99 mg/dL  Glucose, capillary     Status: Abnormal   Collection Time: 01/05/15 12:15 AM  Result Value Ref Range   Glucose-Capillary 128 (H) 65 - 99 mg/dL   Comment 1 Notify RN    Comment 2 Document in Chart   Basic metabolic panel     Status: Abnormal   Collection Time: 01/05/15  5:34 AM  Result Value Ref Range   Sodium 137 135 - 145 mmol/L   Potassium 4.5 3.5 - 5.1 mmol/L   Chloride 105 101 - 111 mmol/L   CO2 24 22 - 32 mmol/L   Glucose, Bld 139 (H) 65 - 99 mg/dL   BUN 11 6 - 20 mg/dL   Creatinine, Ser 0.73 0.44 - 1.00 mg/dL   Calcium 9.6 8.9 - 10.3 mg/dL   GFR calc non Af Amer >60 >60 mL/min   GFR calc Af Amer >60 >60 mL/min    Comment: (NOTE) The eGFR has been calculated using the CKD EPI equation. This calculation has not been validated in all clinical situations. eGFR's persistently <60 mL/min signify possible Chronic Kidney Disease.    Anion gap 8 5 - 15  Glucose, capillary     Status: Abnormal   Collection Time: 01/05/15  7:57 AM  Result Value Ref Range   Glucose-Capillary 133 (H) 65 - 99 mg/dL  Glucose, capillary     Status: Abnormal   Collection Time: 01/05/15 11:44 AM  Result Value Ref Range   Glucose-Capillary 307 (H) 65 - 99 mg/dL  Glucose, capillary     Status: Abnormal   Collection Time: 01/05/15  5:00 PM  Result Value Ref Range   Glucose-Capillary 131 (H) 65 - 99 mg/dL    Dg Chest 2 View  01/03/2015   CLINICAL DATA:  Initial evaluation for acute right-sided weakness.  EXAM: CHEST  2 VIEW  COMPARISON:  None.  FINDINGS: Transverse heart size at the upper limits of normal. Mediastinal silhouette within normal limits. Atheromatous plaque present within the aortic arch.  Lungs are mildly hyperinflated with attenuation of the pulmonary markings, consistent with emphysema. Minimal left basilar atelectasis/scarring. Similarly, probable parenchymal scarring within the peripheral right  lower lobe. No  focal infiltrate, pulmonary edema, or pleural effusion. No pneumothorax. There is a 15 mm nodular and somewhat lobulated density overlying the right upper lobe, indeterminate.  Osteopenia noted.  No acute osseus abnormality.  IMPRESSION: 1. Emphysema.  No superimposed active cardiopulmonary disease. 2. 14 mm nodular density within the right upper lobe. Follow-up examination with cross-sectional imaging of the chest is recommended for further evaluation.   Electronically Signed   By: Jeannine Boga M.D.   On: 01/03/2015 21:39   Ct Head Wo Contrast  01/03/2015   CLINICAL DATA:  Right-sided hemiparesis.  EXAM: CT HEAD WITHOUT CONTRAST  TECHNIQUE: Contiguous axial images were obtained from the base of the skull through the vertex without intravenous contrast.  COMPARISON:  None.  FINDINGS: 2.5 x 2.1 cm mass is noted in the left centrum semiovale with surrounding white matter edema. Also noted is 2.3 x 1.4 cm mass in right cerebellar hemisphere with surrounding white matter edema. These findings most consistent with metastatic disease. Ventricular size is within normal limits. No significant midline shift is seen at this time. No definite hemorrhage is noted. Multiple small lucencies are noted in the bony calvarium which potentially may represent metastatic disease.  IMPRESSION: Masses with surrounding white matter edema are noted in the left parietal lobe and right cerebellar hemisphere most consistent with metastatic disease. Also noted are multiple small lucencies in the bony calvarium which may represent metastatic disease. MRI with and without gadolinium is recommended for further evaluation.   Electronically Signed   By: Marijo Conception, M.D.   On: 01/03/2015 19:40   Ct Chest W Contrast  01/04/2015   ADDENDUM REPORT: 01/04/2015 10:31  ADDENDUM: There is atherosclerotic change in aorta but no aneurysm. No thoracic aortic aneurysm or dissection. No pulmonary embolus appreciable. There are  scattered foci of coronary artery calcification.   Electronically Signed   By: Lowella Grip III M.D.   On: 01/04/2015 10:31   01/04/2015   CLINICAL DATA:  Pulmonary nodular lesion on chest radiograph  EXAM: CT CHEST WITH CONTRAST  TECHNIQUE: Multidetector CT imaging of the chest was performed during intravenous contrast administration.  CONTRAST:  83m OMNIPAQUE IOHEXOL 300 MG/ML  SOLN  COMPARISON:  Chest radiograph January 03, 2015  FINDINGS: There is underlying centrilobular emphysematous change. There is an irregular opacity with mild adjacent peribronchial thickening in the right apex measuring 1.4 x 0.6 cm, best seen on axial slice 9 series 2144 There is a lobular appearing nodular lesion in the posterior segment of the right upper lobe measuring 1.7 x 1.3 cm, best seen on axial slice 20 series 2315 There is a lobular appearing nodule also in the posterior segment of the right upper lobe measuring 1.2 x 1.2 cm, best seen on axial slice 26 series 2400 There is a nodular lesion abutting the minor fissure in the posterior segment left upper lobe on slice 30 series 2867measuring 0.9 x 0.9 cm. There is a nodular lesion in the superior segment of the right lower lobe seen on axial slice 36 series 2619measuring 1.2 x 1.2 cm. There is an irregular nodular lesion in the superior segment of the right lower lobe measuring 1.2 x 1.1 cm on axial slice 38 series 2509 There is an irregular spiculated lesion in the lateral segment of the right middle lobe measuring 2.8 x 2.3 cm. This lesion is best seen on axial slice 40 series 2326 There is an area of consolidation in the posterior segment of the right lower lobe.  There are scattered areas of presumed scarring in the lungs bilaterally.  There is a nodular lesion in the right lobe of the thyroid measuring 1.7 x 0.7 cm. There is a nodular lesion arising from the isthmus of the thyroid extending toward the left measuring 2.5 x 1.0 cm.  There are prominent right hilar  lymph nodes. The largest individual lymph node measures 1.7 x 1.1 cm. A second right hilar lymph node measures 1.3 x 1.2 cm. No other adenopathy is appreciable.  There is left ventricular hypertrophy. There is calcification in multiple coronary artery regions. Pericardium is not thickened.  Visualized upper abdomen, the adrenals appear unremarkable. There is atherosclerotic change in aorta. There is equivocal hepatic steatosis.  There are no blastic or lytic bone lesions. There is degenerative change in the thoracic spine with areas of endplate concavity at several levels, likely due to underlying osteoporosis.  IMPRESSION: Underlying emphysema. Multiple pulmonary nodular lesions, largest in the right middle lobe measuring 2.8 x 2.3 cm. Multifocal neoplasm is of concern. Advise PET-CT to further evaluate.  Consolidation right base posteriorly. This appearance by CT is more consistent with pneumonia than neoplasm, although a followup study in approximately 4 weeks to further evaluate this area may well be warranted.  Right hilar adenopathy.  Thyroid nodular lesions as noted above. Advise thyroid ultrasound to further evaluate.  These results will be called to the ordering clinician or representative by the Radiologist Assistant, and communication documented in the PACS or zVision Dashboard.  Electronically Signed: By: Lowella Grip III M.D. On: 01/04/2015 10:26   Mr Jeri Cos DV Contrast  01/05/2015   CLINICAL DATA:  Right-sided hemi paresis. Unintentional weight loss. Brain masses on CT. Evidence of metastatic disease.  EXAM: MRI HEAD WITHOUT AND WITH CONTRAST  TECHNIQUE: Multiplanar, multiecho pulse sequences of the brain and surrounding structures were obtained without and with intravenous contrast.  CONTRAST:  71m MULTIHANCE GADOBENATE DIMEGLUMINE 529 MG/ML IV SOLN  COMPARISON:  Head CT 01/03/2015  FINDINGS: There is no evidence of acute infarct, midline shift, or extra-axial fluid collection. There is mild  to moderate generalized cerebral atrophy. Patchy periventricular and subcortical white matter T2 hyperintensities are nonspecific but compatible with mild-to-moderate chronic small vessel ischemic disease.  Brain masses described on the recent CT demonstrate solid enhancement and measure 3.4 x 2.5 cm in the inferior right cerebellar hemisphere and 2.8 x 2.1 cm in the posterior left frontal lobe, each with mild surrounding vasogenic edema. Both lesions demonstrate areas of susceptibility artifact consistent with chronic blood products. A separate focus of chronic microhemorrhage is noted slightly more anteriorly in the right cerebellum without associated enhancement. There is a prominent vessel which courses into the superior aspect of the left frontal lesion. No other enhancing brain lesions are identified.  The bone marrow signal of the skull is diffusely heterogeneous on precontrast T1 weighted images and demonstrates diffusely heterogeneous enhancement after contrast administration. No destructive skull lesion is identified, however there are scattered subcentimeter foci of discrete enhancement. No dural-based mass is seen, although the right cerebellar mass does extend towards the dural surface.  Orbits are unremarkable. Paranasal sinuses and mastoid air cells are clear. Major intracranial vascular flow voids are preserved.  IMPRESSION: 1. Two enhancing brain masses, consistent with metastases. Mild surrounding edema without significant mass effect. 2. Diffusely heterogeneous bone marrow in the skull, nonspecific however metastatic disease is a consideration.   Electronically Signed   By: ALogan BoresM.D.   On: 01/05/2015 13:37  Nm Bone Scan Whole Body  01/05/2015   CLINICAL DATA:  Brain metastases, as well as lucent lesions in the calvarium concerning for bone mets  EXAM: NUCLEAR MEDICINE WHOLE BODY BONE SCAN  TECHNIQUE: Whole body anterior and posterior images were obtained approximately 3 hours after  intravenous injection of radiopharmaceutical.  RADIOPHARMACEUTICALS:  25 mCi Technetium-5mMDP IV  COMPARISON:  Multiple studies performed september 2016.  FINDINGS: Multiple foci of increased uptake involving the bilateral ribs. These appear to correspond to numerous rib fractures visible on the 01/04/15 CT scan. Lesion left femoral neck and trochanters consistent with fracture seen on same CT scan. Focus of uptake distal left forearm. Minimally heterogenous uptake in the calvarium without definite focal metastatic lesion there.  Degenerative uptake in the knees, right worse than left. Degenerative uptake in the ankles and probably degenerative uptake in the bilateral hindfeet.Degenerative uptake bilateral AC joints.  IMPRESSION: Lesions bilateral ribs and left femur consistent with fractures visible on recent CT scan.  Uptake distal left forearm:  Forearm radiographs recommended.  Minimally heterogenous uptake in the calvarium without definitive metastasis in this area.   Electronically Signed   By: RSkipper ClicheM.D.   On: 01/05/2015 17:00   Ct Abdomen Pelvis W Contrast  01/05/2015   CLINICAL DATA:  64year old female with concern for malignancy  EXAM: CT ABDOMEN AND PELVIS WITH CONTRAST  TECHNIQUE: Multidetector CT imaging of the abdomen and pelvis was performed using the standard protocol following bolus administration of intravenous contrast.  CONTRAST:  87mOMNIPAQUE IOHEXOL 300 MG/ML  SOLN  COMPARISON:  Chest CT dated 01/04/2015  FINDINGS: Evaluation is limited due to respiratory motion artifact.  The 4.6 x 2.8 cm pleural based masslike opacity is noted at the right lung base posteriorly.  No intra-abdominal free air or free fluid identified.  The liver appears unremarkable. There are multiple stones within the gallbladder. No pericholecystic fluid or evidence of gallbladder inflammation. The pancreas appears unremarkable. There is slight prominence of the head of the pancreas with extension into the  duodenal C-loop. No discrete lesion identified. There is no atrophy of the gland or duct dilatation. The spleen appears unremarkable. A splenule is noted. There is apparent thickening of the left adrenal gland on the coronal view 58 be related to an underlying is normal. The right adrenal gland is unremarkable.  There is lobulated appearance of the renal cortices bilaterally. There is a 2.4 x 2.1 cm hypoenhancing lesion in the superior pole of the left kidney. Ultrasound is recommended for further characterization. Subcentimeter scattered bilateral renal hypodense lesions are too small to characterize. There is no hydronephrosis on either side. The visualized ureters and urinary bladder appear unremarkable. Hysterectomy.  There is sigmoid diverticulosis with muscular hypertrophy. No active inflammation. Moderate stool noted throughout the colon. There no evidence of bowel obstruction or inflammation.  Advanced aortoiliac atherosclerotic disease. The origins of the celiac axis, SMA, IMA as well as the origins of the renal arteries are patent. No portal venous gas identified. There is no lymphadenopathy.  Osteopenia with degenerative changes of the spine. There is compression deformity of the superior endplate of the [id number], age indeterminate, likely chronic. Clinical correlation is recommended. There is fracture of the greater trochanter of the left femur fossa on the prior CT dated 10/23/2014. Old left posterior rib fractures noted. No new fracture identified. Small scattered lucencies throughout the lumbar spine may be related to osteopenia or malignancy such as multiple myeloma or metastatic disease.  IMPRESSION: Partially visualized  right lung base subpleural consolidation/mass as seen on the prior chest CT.  Left renal upper pole hypodense lesion. Ultrasound is recommended for further initial evaluation. MRI may be related for additional characterization depending on the ultrasound findings.   Cholelithiasis.  Sigmoid diverticulosis. No evidence of bowel obstruction or inflammation.  Osteopenia with fracture of the greater trochanter of the left femur as well as old left posterior rib fractures. Small lucencies throughout the lumbar vertebra may be related to osteopenia or represent multiple myeloma/ metastatic disease. Bone scan may provide better evaluation if clinically indicated.   Electronically Signed   By: Anner Crete M.D.   On: 01/05/2015 03:46    Pertinent items are noted in HPI. Blood pressure 127/77, pulse 88, temperature 97.4 F (36.3 C), temperature source Oral, resp. rate 16, height '5\' 6"'  (1.676 m), weight 47.7 kg (105 lb 2.6 oz), SpO2 95 %. The patient is awake and alert. She is oriented and appropriate. Examination her head ears eyes and throat is unremarkable. Neck is supple with full active range of motion. Airway is midline. Carotid pulses are normal. Chest and abdomen are benign. Extremities are free from injury or deformity. Neurologically her cranial nerve function is intact aside from some mild right facial weakness and she has significant weakness of both her right upper and lower extremity right upper extremity grating out at 4 over 5 and right lower extremity at 4 minus over 5. Reflexes are increased on the right. Toes are upgoing on the right. Sensory examination is nonfocal.  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 The patient requires tissue diagnosis and I think that percutaneous biopsy of her lung lesion would be our first choice. I believe that she is primarily symptomatic from her left hemispheric metastasis and currently I do not think that she is symptomatic from her right cerebellar metastasis. Depending on the tumor pathology, it is likely that she would be a good candidate for stereotactic radiosurgery treatment of these brain metastasis without need for  craniotomy. I status in part because the central lesion in her left hemisphere is difficult to access surgically and I do not think her right cerebellar lesion is symptomatic at present. I await the results of her biopsy before making further plans. I agree with her current regimen of IV Decadron.  Kristin Griffin A 01/05/2015, 7:11 PM

## 2015-01-05 NOTE — Progress Notes (Signed)
[facility]                                                                              Patient Demographics  Kristin Griffin, is a 64 y.o. female, DOB - 07-21-1950, AUQ:[id number]  Admit date - 01/03/2015   Admitting Physician Theressa Millard, MD  Outpatient Primary MD for the patient is Delman Cheadle, MD  LOS - 2   Chief Complaint  Patient presents with  . Weakness       Brief HPI   Kristin Griffin is a 64 y.o. female with a history of HTN who presents to the ED with complaints of Right sided Weakness and progressive decline for the past 3 weeks.The patient had presented with unintentional weight loss of 10 pounds in the past 5 weeks, no fevers or chills. Patient's brothers notice that she was walking and dragging her right leg and brought her to the ED. CT head showed 2.3 into 1.4 cm mass in the right cerebellar hemisphere, 2.5 and 2.1 cm mass in the left centrum semiovale with white matter edema. It was followed by a CT chest with contrast to evaluate for a primary. CT chest showed multiple pulmonary nodules. Patient was referred to admission.  Assessment & Plan    Principal Problem:   Brain mass, right lung mass, bony lucencies in the lumbar spine, renal lesion, underlying smoking history one pack per day for 45-50 years - CT head showed 2.3 into 1.4 cm mass in the right cerebellar hemisphere, 2.5 and 2.1 cm mass in the left centrum semiovale with white matter edema.  - Patient started on IV Decadron - Neurosurgery consulted, discussed with Dr. Trenton Gammon, I have also paged radiation oncology  Right lung mass - Patient has multiple lung masses, including spiculated lesion in the right middle lobe 2.8X2.3 centimeter, underlying emphysema, multifocal neoplasm also of concern - Discussed with pulmonology, Dr. Lake Bells and interventional radiology, Dr. Pascal Lux, for tissue diagnosis.  - I have also called oncology, discussed with Dr. Julien Nordmann - Patient was placed on  levofloxacin for possible postobstructive pneumonia as per CT chest  Left renal upper pole lesion, lumbar vertebra lesions - Renal ultrasound ordered, bone scan ordered to rule out any bony metastasis   Protein-calorie malnutrition, severe with underlying possible malignancy - Nutrition consult  Right-sided weakness likely due to the brain masses and edema - Continue physical therapy, IV Decadron  Hypertension Currently stable, continue verapamil  Code Status: Full code  Family Communication: Discussed in detail with the patient, all imaging results, lab results explained to the patient and her sister at the bedside    Disposition Plan: Not medically ready  Time Spent in minutes   45 minutes, discussing with the patient and her family multiple times as well as coordinating care between the consultants  Procedures  CT head CT chest, CT abdomen and pelvis  Consults   Pulm IR Neurosurgery oncology   DVT Prophylaxis  heparin   Medications  Scheduled Meds: . calcium carbonate  1 tablet Oral Q breakfast  . dexamethasone  4 mg Intravenous 4 times per day  . feeding supplement (ENSURE ENLIVE)  237 mL  Oral BID BM  . heparin subcutaneous  5,000 Units Subcutaneous 3 times per day  . insulin aspart  0-15 Units Subcutaneous TID WC  . levofloxacin  750 mg Oral Daily  . multivitamin with minerals  1 tablet Oral Daily  . omega-3 acid ethyl esters  1 g Oral Daily  . pantoprazole  40 mg Oral QHS  . sodium chloride  3 mL Intravenous Q12H  . verapamil  240 mg Oral QHS   Continuous Infusions:  PRN Meds:.sodium chloride, acetaminophen **OR** acetaminophen, alum & mag hydroxide-simeth, HYDROmorphone (DILAUDID) injection, ondansetron **OR** ondansetron (ZOFRAN) IV, oxyCODONE, sodium chloride   Antibiotics   Anti-infectives    Start     Dose/Rate Route Frequency Ordered Stop   01/05/15 1100  levofloxacin (LEVAQUIN) tablet 750 mg     750 mg Oral Daily 01/05/15 1028     01/04/15  1200  levofloxacin (LEVAQUIN) IVPB 750 mg  Status:  Discontinued     750 mg 100 mL/hr over 90 Minutes Intravenous Every 24 hours 01/04/15 1111 01/05/15 1028        Subjective:   Kristin Griffin was seen and examined today.  Patient denies dizziness, chest pain, shortness of breath, abdominal pain, N/V/D/C. Eating breakfast, sister at the bedside   Objective:   Blood pressure 114/77, pulse 88, temperature 98.1 F (36.7 C), temperature source Oral, resp. rate 16, height '5\' 6"'  (1.676 m), weight 47.7 kg (105 lb 2.6 oz), SpO2 93 %.  Wt Readings from Last 3 Encounters:  01/03/15 47.7 kg (105 lb 2.6 oz)  02/03/14 51.166 kg (112 lb 12.8 oz)  11/21/13 51.256 kg (113 lb)     Intake/Output Summary (Last 24 hours) at 01/05/15 1215 Last data filed at 01/05/15 1013  Gross per 24 hour  Intake    962 ml  Output   2000 ml  Net  -1038 ml    Exam  General: Alert and oriented x 3, NAD  HEENT:  PERRLA, EOMI, Anicteric Sclera, mucous membranes moist.   Neck: Supple, no JVD, no masses  CVS: S1 S2 auscultated, no rubs, murmurs or gallops. Regular rate and rhythm.  Respiratory: Clear to auscultation bilaterally, no wheezing, rales or rhonchi  Abdomen: Soft, nontender, nondistended, + bowel sounds  Ext: no cyanosis clubbing or edema  Neuro: no new weakness  Skin: No rashes  Psych: Normal affect and demeanor, alert and oriented x3    Data Review   Micro Results Recent Results (from the past 240 hour(s))  Urine culture     Status: None   Collection Time: 01/03/15  8:10 PM  Result Value Ref Range Status   Specimen Description URINE, CLEAN CATCH  Final   Special Requests NONE  Final   Culture MULTIPLE SPECIES PRESENT, SUGGEST RECOLLECTION  Final   Report Status 01/05/2015 FINAL  Final    Radiology Reports Dg Chest 2 View  01/03/2015   CLINICAL DATA:  Initial evaluation for acute right-sided weakness.  EXAM: CHEST  2 VIEW  COMPARISON:  None.  FINDINGS: Transverse heart size at  the upper limits of normal. Mediastinal silhouette within normal limits. Atheromatous plaque present within the aortic arch.  Lungs are mildly hyperinflated with attenuation of the pulmonary markings, consistent with emphysema. Minimal left basilar atelectasis/scarring. Similarly, probable parenchymal scarring within the peripheral right lower lobe. No focal infiltrate, pulmonary edema, or pleural effusion. No pneumothorax. There is a 15 mm nodular and somewhat lobulated density overlying the right upper lobe, indeterminate.  Osteopenia noted.  No acute osseus  abnormality.  IMPRESSION: 1. Emphysema.  No superimposed active cardiopulmonary disease. 2. 14 mm nodular density within the right upper lobe. Follow-up examination with cross-sectional imaging of the chest is recommended for further evaluation.   Electronically Signed   By: Jeannine Boga M.D.   On: 01/03/2015 21:39   Ct Head Wo Contrast  01/03/2015   CLINICAL DATA:  Right-sided hemiparesis.  EXAM: CT HEAD WITHOUT CONTRAST  TECHNIQUE: Contiguous axial images were obtained from the base of the skull through the vertex without intravenous contrast.  COMPARISON:  None.  FINDINGS: 2.5 x 2.1 cm mass is noted in the left centrum semiovale with surrounding white matter edema. Also noted is 2.3 x 1.4 cm mass in right cerebellar hemisphere with surrounding white matter edema. These findings most consistent with metastatic disease. Ventricular size is within normal limits. No significant midline shift is seen at this time. No definite hemorrhage is noted. Multiple small lucencies are noted in the bony calvarium which potentially may represent metastatic disease.  IMPRESSION: Masses with surrounding white matter edema are noted in the left parietal lobe and right cerebellar hemisphere most consistent with metastatic disease. Also noted are multiple small lucencies in the bony calvarium which may represent metastatic disease. MRI with and without gadolinium is  recommended for further evaluation.   Electronically Signed   By: Marijo Conception, M.D.   On: 01/03/2015 19:40   Ct Chest W Contrast  01/04/2015   ADDENDUM REPORT: 01/04/2015 10:31  ADDENDUM: There is atherosclerotic change in aorta but no aneurysm. No thoracic aortic aneurysm or dissection. No pulmonary embolus appreciable. There are scattered foci of coronary artery calcification.   Electronically Signed   By: Lowella Grip III M.D.   On: 01/04/2015 10:31   01/04/2015   CLINICAL DATA:  Pulmonary nodular lesion on chest radiograph  EXAM: CT CHEST WITH CONTRAST  TECHNIQUE: Multidetector CT imaging of the chest was performed during intravenous contrast administration.  CONTRAST:  55m OMNIPAQUE IOHEXOL 300 MG/ML  SOLN  COMPARISON:  Chest radiograph January 03, 2015  FINDINGS: There is underlying centrilobular emphysematous change. There is an irregular opacity with mild adjacent peribronchial thickening in the right apex measuring 1.4 x 0.6 cm, best seen on axial slice 9 series 2340 There is a lobular appearing nodular lesion in the posterior segment of the right upper lobe measuring 1.7 x 1.3 cm, best seen on axial slice 20 series 2370 There is a lobular appearing nodule also in the posterior segment of the right upper lobe measuring 1.2 x 1.2 cm, best seen on axial slice 26 series 2964 There is a nodular lesion abutting the minor fissure in the posterior segment left upper lobe on slice 30 series 2383measuring 0.9 x 0.9 cm. There is a nodular lesion in the superior segment of the right lower lobe seen on axial slice 36 series 2818measuring 1.2 x 1.2 cm. There is an irregular nodular lesion in the superior segment of the right lower lobe measuring 1.2 x 1.1 cm on axial slice 38 series 2403 There is an irregular spiculated lesion in the lateral segment of the right middle lobe measuring 2.8 x 2.3 cm. This lesion is best seen on axial slice 40 series 2754 There is an area of consolidation in the posterior  segment of the right lower lobe. There are scattered areas of presumed scarring in the lungs bilaterally.  There is a nodular lesion in the right lobe of the thyroid measuring 1.7 x 0.7 cm. There is  a nodular lesion arising from the isthmus of the thyroid extending toward the left measuring 2.5 x 1.0 cm.  There are prominent right hilar lymph nodes. The largest individual lymph node measures 1.7 x 1.1 cm. A second right hilar lymph node measures 1.3 x 1.2 cm. No other adenopathy is appreciable.  There is left ventricular hypertrophy. There is calcification in multiple coronary artery regions. Pericardium is not thickened.  Visualized upper abdomen, the adrenals appear unremarkable. There is atherosclerotic change in aorta. There is equivocal hepatic steatosis.  There are no blastic or lytic bone lesions. There is degenerative change in the thoracic spine with areas of endplate concavity at several levels, likely due to underlying osteoporosis.  IMPRESSION: Underlying emphysema. Multiple pulmonary nodular lesions, largest in the right middle lobe measuring 2.8 x 2.3 cm. Multifocal neoplasm is of concern. Advise PET-CT to further evaluate.  Consolidation right base posteriorly. This appearance by CT is more consistent with pneumonia than neoplasm, although a followup study in approximately 4 weeks to further evaluate this area may well be warranted.  Right hilar adenopathy.  Thyroid nodular lesions as noted above. Advise thyroid ultrasound to further evaluate.  These results will be called to the ordering clinician or representative by the Radiologist Assistant, and communication documented in the PACS or zVision Dashboard.  Electronically Signed: By: Lowella Grip III M.D. On: 01/04/2015 10:26   Ct Abdomen Pelvis W Contrast  01/05/2015   CLINICAL DATA:  64 year old female with concern for malignancy  EXAM: CT ABDOMEN AND PELVIS WITH CONTRAST  TECHNIQUE: Multidetector CT imaging of the abdomen and pelvis was  performed using the standard protocol following bolus administration of intravenous contrast.  CONTRAST:  37m OMNIPAQUE IOHEXOL 300 MG/ML  SOLN  COMPARISON:  Chest CT dated 01/04/2015  FINDINGS: Evaluation is limited due to respiratory motion artifact.  The 4.6 x 2.8 cm pleural based masslike opacity is noted at the right lung base posteriorly.  No intra-abdominal free air or free fluid identified.  The liver appears unremarkable. There are multiple stones within the gallbladder. No pericholecystic fluid or evidence of gallbladder inflammation. The pancreas appears unremarkable. There is slight prominence of the head of the pancreas with extension into the duodenal C-loop. No discrete lesion identified. There is no atrophy of the gland or duct dilatation. The spleen appears unremarkable. A splenule is noted. There is apparent thickening of the left adrenal gland on the coronal view 58 be related to an underlying is normal. The right adrenal gland is unremarkable.  There is lobulated appearance of the renal cortices bilaterally. There is a 2.4 x 2.1 cm hypoenhancing lesion in the superior pole of the left kidney. Ultrasound is recommended for further characterization. Subcentimeter scattered bilateral renal hypodense lesions are too small to characterize. There is no hydronephrosis on either side. The visualized ureters and urinary bladder appear unremarkable. Hysterectomy.  There is sigmoid diverticulosis with muscular hypertrophy. No active inflammation. Moderate stool noted throughout the colon. There no evidence of bowel obstruction or inflammation.  Advanced aortoiliac atherosclerotic disease. The origins of the celiac axis, SMA, IMA as well as the origins of the renal arteries are patent. No portal venous gas identified. There is no lymphadenopathy.  Osteopenia with degenerative changes of the spine. There is compression deformity of the superior endplate of the [id number], age indeterminate, likely chronic.  Clinical correlation is recommended. There is fracture of the greater trochanter of the left femur fossa on the prior CT dated 10/23/2014. Old left posterior rib fractures  noted. No new fracture identified. Small scattered lucencies throughout the lumbar spine may be related to osteopenia or malignancy such as multiple myeloma or metastatic disease.  IMPRESSION: Partially visualized right lung base subpleural consolidation/mass as seen on the prior chest CT.  Left renal upper pole hypodense lesion. Ultrasound is recommended for further initial evaluation. MRI may be related for additional characterization depending on the ultrasound findings.  Cholelithiasis.  Sigmoid diverticulosis. No evidence of bowel obstruction or inflammation.  Osteopenia with fracture of the greater trochanter of the left femur as well as old left posterior rib fractures. Small lucencies throughout the lumbar vertebra may be related to osteopenia or represent multiple myeloma/ metastatic disease. Bone scan may provide better evaluation if clinically indicated.   Electronically Signed   By: Anner Crete M.D.   On: 01/05/2015 03:46    CBC  Recent Labs Lab 01/01/15 1524 01/03/15 1819 01/03/15 1841 01/04/15 0440  WBC 7.2 9.0  --  6.0  HGB 14.9 15.0 16.3* 13.9  HCT 45.5 42.9 48.0* 41.2  PLT  --  380  --  353  MCV 93.7 92.9  --  93.4  MCH 30.7 32.5  --  31.5  MCHC 32.7 35.0  --  33.7  RDW  --  13.6  --  13.7  LYMPHSABS  --  1.8  --   --   MONOABS  --  0.8  --   --   EOSABS  --  0.1  --   --   BASOSABS  --  0.0  --   --     Chemistries   Recent Labs Lab 01/01/15 1508 01/03/15 1819 01/03/15 1841 01/04/15 0440 01/05/15 0534  NA 142 138 140 139 137  K 4.6 3.3* 3.4* 3.0* 4.5  CL 98 103 103 104 105  CO2 28 23  --  22 24  GLUCOSE 92 101* 102* 106* 139*  BUN '9 9 11 6 11  ' CREATININE 0.73 0.79 0.80 0.76 0.73  CALCIUM 10.2 9.6  --  9.1 9.6  AST 20 23  --   --   --   ALT 12 13*  --   --   --   ALKPHOS 99 95  --    --   --   BILITOT 0.7 0.7  --   --   --    ------------------------------------------------------------------------------------------------------------------ estimated creatinine clearance is 52.8 mL/min (by C-G formula based on Cr of 0.73). ------------------------------------------------------------------------------------------------------------------  Recent Labs  01/04/15 0440  HGBA1C 5.2   ------------------------------------------------------------------------------------------------------------------ No results for input(s): CHOL, HDL, LDLCALC, TRIG, CHOLHDL, LDLDIRECT in the last 72 hours. ------------------------------------------------------------------------------------------------------------------ No results for input(s): TSH, T4TOTAL, T3FREE, THYROIDAB in the last 72 hours.  Invalid input(s): FREET3 ------------------------------------------------------------------------------------------------------------------ No results for input(s): VITAMINB12, FOLATE, FERRITIN, TIBC, IRON, RETICCTPCT in the last 72 hours.  Coagulation profile  Recent Labs Lab 01/03/15 1819  INR 0.98    No results for input(s): DDIMER in the last 72 hours.  Cardiac Enzymes No results for input(s): CKMB, TROPONINI, MYOGLOBIN in the last 168 hours.  Invalid input(s): CK ------------------------------------------------------------------------------------------------------------------ Invalid input(s): [place]  01/04/15 0911 01/04/15 1222 01/04/15 1656 01/05/15 0015 01/05/15 0757 01/05/15 1144  GLUCAP 128* 309* 363* 128* 133* 307*     RAI,RIPUDEEP M.D. [facility] 01/05/2015, 12:15 PM  Pager: [phone] Between 7am to 7pm - call Pager - [phone]  After 7pm go to [email or web address] - password [email or web address]  Call night coverage person covering after 7pm

## 2015-01-05 NOTE — Progress Notes (Signed)
Inpatient Rehabilitation  We have received a prescreen request for possible IP Rehab admission.  I note that neurosurgery and radiation oncology consults are pending. We are not recommending an IP Rehab consult until plan of care is determined.  Please call if questions.  Thanks.  [facility] Admissions Coordinator Cell [phone] Office [phone]

## 2015-01-05 NOTE — Evaluation (Signed)
Occupational Therapy Evaluation Patient Details Name: Kristin Griffin MRN: [id number] DOB: [birth year] Today's Date: 01/05/2015    History of Present Illness 64 y.o. female presents with complaints of Right sided Weakness and progressive decline for the past 3 weeks.  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   Clinical Impression   Pt admitted with above. Pt independent with ADLs (took a long time), PTA. Feel pt will benefit from acute OT to increase independence prior to d/c.     Follow Up Recommendations  CIR    Equipment Recommendations  Other (comment) (defer to next venue)    Recommendations for Other Services Rehab consult     Precautions / Restrictions Precautions Precautions: Fall Restrictions Weight Bearing Restrictions: No      Mobility Bed Mobility Overal bed mobility: Needs Assistance Bed Mobility: Supine to Sit;Sit to Supine     Supine to sit: Supervision Sit to supine: Supervision (with assist to scoot HOB)   General bed mobility comments: assist to scoot HOB; pt left her right hand in rail-cue to get it out.  Transfers Overall transfer level: Needs assistance Transfers: Sit to/from Stand Sit to Stand: Min guard;Min assist             Balance Overall balance assessment: Needs assistance     Decreased balance with ambulation and to steady at times with sit to stand transfer.                 ADL Overall ADL's : Needs assistance/impaired     Grooming: Wash/dry hands;Wash/dry face;Set up;Supervision/safety;Standing               Lower Body Dressing: Min guard;Minimal assistance;Sit to/from stand   Toilet Transfer: Minimal assistance;Ambulation (sit to stand from bed)           Functional mobility during ADLs:  Minimal assistance General ADL Comments: Encouraged pt to be using right hand in session.      Vision     Perception     Praxis      Pertinent Vitals/Pain Pain Assessment: No/denies pain     Hand Dominance Right   Extremity/Trunk Assessment Upper Extremity Assessment Upper Extremity Assessment: RUE deficits/detail RUE Deficits / Details: 3+/5 shoulder flexion RUE Coordination:  (slower with fine and gross motor coordination)   Lower Extremity Assessment Lower Extremity Assessment: Defer to PT evaluation RLE Deficits / Details: noted weakness in dorsiflexion, hip flexion gross assessment 3-/5 RLE Coordination: decreased fine motor;decreased gross motor       Communication Communication Communication: No difficulties   Cognition Arousal/Alertness: Awake/alert Behavior During Therapy: WFL for tasks assessed/performed Overall Cognitive Status: Impaired/Different from baseline Area of Impairment: Attention   Current Attention Level: inattention to left side        General Comments       Exercises       Shoulder Instructions      Home Living Family/patient expects to be discharged to:: Private residence Living Arrangements: Alone Available Help at Discharge: Family Type of Home: House Home Access: Stairs to enter Technical brewer of Steps: 6   Home Layout: Laundry or work area in basement;Able to live on main level with bedroom/bathroom     Bathroom Shower/Tub: Corporate investment banker: Standard     Home  Equipment: Cane - single point;Walker - 2 wheels          Prior Functioning/Environment Level of Independence: Needs assistance    ADL's / Homemaking Assistance Needed: assist with cooking and cleaning; sponge bathing        OT Diagnosis:  (hemiparesis dominant side)   OT Problem List: Decreased cognition;Decreased knowledge of use of DME or AE;Decreased knowledge of precautions;Decreased strength;Impaired balance  (sitting and/or standing);Decreased coordination   OT Treatment/Interventions: Self-care/ADL training;DME and/or AE instruction;Therapeutic activities;Patient/family education;Balance training;Cognitive remediation/compensation;Therapeutic exercise;Neuromuscular education    OT Goals(Current goals can be found in the care plan section) Acute Rehab OT Goals Patient Stated Goal: be able to function on my own OT Goal Formulation: With patient Time For Goal Achievement: 01/12/15 Potential to Achieve Goals: Good ADL Goals Pt Will Perform Lower Body Bathing: sit to/from stand;with set-up;with supervision Pt Will Perform Lower Body Dressing: with set-up;with supervision;sit to/from stand Pt Will Transfer to Toilet: ambulating;with supervision;bedside commode Pt Will Perform Toileting - Clothing Manipulation and hygiene: with set-up;with supervision;sit to/from stand Additional ADL Goal #1: Pt will independently perform HEP for RUE to increase strength and coordination.  OT Frequency: Min 2X/week   Barriers to D/C:            Co-evaluation              End of Session Equipment Utilized During Treatment: Gait belt  Activity Tolerance: Patient tolerated treatment well Patient left: in bed;with call bell/phone within reach;with bed alarm set;with family/visitor present   Time: 3419-6222 OT Time Calculation (min): 22 min Charges:  OT General Charges $OT Visit: 1 Procedure OT Evaluation $Initial OT Evaluation Tier I: 1 Procedure G-CodesBenito Mccreedy OTR/L [id number] 01/05/2015, 3:56 PM

## 2015-01-05 NOTE — Consult Note (Signed)
[place]  Telephone:([phone]) [facility] CONSULTATION NOTE  Kristin Griffin                                MR#: [id number]  DOB: [birth year]                              CSN#: [id number]  Referring MD: Dr. Sheliah [facility]   Reason for Consult: Metastatic Cancer   HDQ:QIWLN Kristin Griffin is a 64 y.o. female admitted on 01/03/2015 with 3 week history of progressive clinical decline, complicated with right-sided weakness at the time of presentation. Denies fevers, chills, night sweats or mucositis. Denies any productive cough or hemoptysis. Denies any chest pain or palpitations. Denies lower extremity swelling. Denies nausea, heartburn or change in bowel habits. Appetite isDecrease, having lost about 10 pounds over the last 5 weeks, which she initially thought it was related to losing her husband recently. Denies any dysuria. Denies abnormal skin rashes. Denies any bleeding issues such as epistaxis, hematemesis, hematuria or hematochezia. Denies headaches, seizures, vision changes, bladder incontinence. No confusion is reported. CT of the head on 01/03/2015 and revealed a 2.5 x 2.1 cm mass in the left parietal lobe and a 2.3 x 1.4 cm mass in the right cerebellar hemisphere consistent with metastatic disease. CT of the chest was ordered, suspecting lung primary. This confirmed Multiple pulmonary nodular lesions, largest in the right middle lobe measuring 2.8 x 2.3 cm. In addition, right hilar adenopathy and thyroid nodular lesions were observed. CT of the abdomen and pelvis with contrast on 01/05/2015, showed A suspicious left renal upper pole hypodense lesion, and small lucencies throughout the lumbar vertebrae, of unknown etiology, rule out malignancy. Bone scan is pending. She was placed on IV Decadron. Neurosurgery and radiation oncology has been contacted, consult pending. Interventional radiology consultation is pending as well, for tissue diagnosis We were  requested to see this patient with recommendations.        PMH:  Past Medical History  Diagnosis Date  . Hypertension     Surgeries:  Past Surgical History  Procedure Laterality Date  . Abdominal hysterectomy      Allergies: No Known Allergies  Medications:   Scheduled Meds: . calcium carbonate  1 tablet Oral Q breakfast  . dexamethasone  4 mg Intravenous 4 times per day  . feeding supplement (ENSURE ENLIVE)  237 mL Oral BID BM  . heparin subcutaneous  5,000 Units Subcutaneous 3 times per day  . insulin aspart  0-15 Units Subcutaneous TID WC  . levofloxacin  750 mg Oral Daily  . multivitamin with minerals  1 tablet Oral Daily  . omega-3 acid ethyl esters  1 g Oral Daily  . pantoprazole  40 mg Oral QHS  . sodium chloride  3 mL Intravenous Q12H  . verapamil  240 mg Oral QHS   Continuous Infusions:  PRN Meds:.sodium chloride, acetaminophen **OR** acetaminophen, alum & mag hydroxide-simeth, HYDROmorphone (DILAUDID) injection, ondansetron **OR** ondansetron (ZOFRAN) IV, oxyCODONE, sodium chloride  LGX:QJJHER chloride, alum & mag hydroxide-simeth, HYDROmorphone (DILAUDID) injection, oxyCODONE, sodium chloride  ROS: Constitutional: Denies fevers, chills or abnormal night sweats Eyes: Denies blurriness of vision, double vision or watery eyes Ears, nose, mouth, throat, and face: Denies mucositis or sore throat Respiratory: Denies cough, dyspnea or wheezes Cardiovascular: Denies palpitation, chest discomfort or lower extremity  swelling Gastrointestinal:  Denies nausea, heartburn or change in bowel habits Skin: Denies abnormal skin rashes Lymphatics: Denies new lymphadenopathy or easy bruising Neurological: Denies numbness, tingling, her right sided weakness is improving after Decadron initiation Behavioral/Psych: Mood is tearful All other systems were reviewed with the patient and are negative.   Family History:   Strong family history of breast cancer in the maternal and  paternal side including her mother.  No history of lung cancer.  Health maintenance: she is up to date with screening exams, last MGM on 12/24/13, negative.  Social History:  reports that she quit smoking about 14 months ago The use of one pack a day for about 45 years. She does not have any smokeless tobacco history on file. She reports that she drinks alcohol. She reports that she does not use illicit drugs. She is widowed. She has 1 son who lives in [place], [place].  Physical Exam    Filed Vitals:   01/05/15 0436  BP: 114/77  Pulse: 88  Temp: 98.1 F (36.7 C)  Resp: 16   Filed Weights   01/03/15 2204  Weight: 105 lb 2.6 oz (47.7 kg)    GENERAL:alert, no distress and comfortable SKIN: skin color, texture, turgor are normal, no rashes or significant lesions EYES: normal, conjunctiva are pink and non-injected, sclera clear OROPHARYNX:no exudate, no erythema and lips, buccal mucosa, and tongue normal  NECK: supple, thyroid normal size, non-tender, without nodularity LYMPH:  no palpable lymphadenopathy in the cervical, axillary or inguinal LUNGS: clear to auscultation and percussion with normal breathing effort HEART: regular rate & rhythm and no murmurs and no lower extremity edema ABDOMEN:abdomen soft, non-tender and normal bowel sounds Musculoskeletal:no cyanosis of digits and no clubbing  PSYCH: alert & oriented x 3 with fluent speech NEURO: Right sided weakness, improved since admission to 3-4/5, slight surring of speech, no sensory deficits  CBC  Recent Labs Lab 01/01/15 1524 01/03/15 1819 01/03/15 1841 01/04/15 0440  WBC 7.2 9.0  --  6.0  HGB 14.9 15.0 16.3* 13.9  HCT 45.5 42.9 48.0* 41.2  PLT  --  380  --  353  MCV 93.7 92.9  --  93.4  MCH 30.7 32.5  --  31.5  MCHC 32.7 35.0  --  33.7  RDW  --  13.6  --  13.7  LYMPHSABS  --  1.8  --   --   MONOABS  --  0.8  --   --   EOSABS  --  0.1  --   --   BASOSABS  --  0.0  --   --     Anemia panel:  No  results for input(s): VITAMINB12, FOLATE, FERRITIN, TIBC, IRON, RETICCTPCT in the last 72 hours.  CMP    Recent Labs Lab 01/01/15 1508 01/03/15 1819 01/03/15 1841 01/04/15 0440 01/05/15 0534  NA 142 138 140 139 137  K 4.6 3.3* 3.4* 3.0* 4.5  CL 98 103 103 104 105  CO2 28 23  --  22 24  GLUCOSE 92 101* 102* 106* 139*  BUN '9 9 11 6 11  ' CREATININE 0.73 0.79 0.80 0.76 0.73  CALCIUM 10.2 9.6  --  9.1 9.6  AST 20 23  --   --   --   ALT 12 13*  --   --   --   ALKPHOS 99 95  --   --   --   BILITOT 0.7 0.7  --   --   --  Component Value Date/Time   BILITOT 0.7 01/03/2015 1819      Recent Labs Lab 01/03/15 1819  INR 0.98    No results for input(s): DDIMER in the last 72 hours.  Imaging Studies:  Dg Chest 2 View  01/03/2015   CLINICAL DATA:  Initial evaluation for acute right-sided weakness.  EXAM: CHEST  2 VIEW  COMPARISON:  None.  FINDINGS: Transverse heart size at the upper limits of normal. Mediastinal silhouette within normal limits. Atheromatous plaque present within the aortic arch.  Lungs are mildly hyperinflated with attenuation of the pulmonary markings, consistent with emphysema. Minimal left basilar atelectasis/scarring. Similarly, probable parenchymal scarring within the peripheral right lower lobe. No focal infiltrate, pulmonary edema, or pleural effusion. No pneumothorax. There is a 15 mm nodular and somewhat lobulated density overlying the right upper lobe, indeterminate.  Osteopenia noted.  No acute osseus abnormality.  IMPRESSION: 1. Emphysema.  No superimposed active cardiopulmonary disease. 2. 14 mm nodular density within the right upper lobe. Follow-up examination with cross-sectional imaging of the chest is recommended for further evaluation.   Electronically Signed   By: Jeannine Boga M.D.   On: 01/03/2015 21:39   Ct Head Wo Contrast  01/03/2015   CLINICAL DATA:  Right-sided hemiparesis.  EXAM: CT HEAD WITHOUT CONTRAST  TECHNIQUE: Contiguous  axial images were obtained from the base of the skull through the vertex without intravenous contrast.  COMPARISON:  None.  FINDINGS: 2.5 x 2.1 cm mass is noted in the left centrum semiovale with surrounding white matter edema. Also noted is 2.3 x 1.4 cm mass in right cerebellar hemisphere with surrounding white matter edema. These findings most consistent with metastatic disease. Ventricular size is within normal limits. No significant midline shift is seen at this time. No definite hemorrhage is noted. Multiple small lucencies are noted in the bony calvarium which potentially may represent metastatic disease.  IMPRESSION: Masses with surrounding white matter edema are noted in the left parietal lobe and right cerebellar hemisphere most consistent with metastatic disease. Also noted are multiple small lucencies in the bony calvarium which may represent metastatic disease. MRI with and without gadolinium is recommended for further evaluation.   Electronically Signed   By: Marijo Conception, M.D.   On: 01/03/2015 19:40   Ct Chest W Contrast  01/04/2015   ADDENDUM REPORT: 01/04/2015 10:31  ADDENDUM: There is atherosclerotic change in aorta but no aneurysm. No thoracic aortic aneurysm or dissection. No pulmonary embolus appreciable. There are scattered foci of coronary artery calcification.   Electronically Signed   By: Lowella Grip III M.D.   On: 01/04/2015 10:31   01/04/2015 COMPARISON:  Chest radiograph January 03, 2015  FINDINGS: There is underlying centrilobular emphysematous change. There is an irregular opacity with mild adjacent peribronchial thickening in the right apex measuring 1.4 x 0.6 cm, best seen on axial slice 9 series 295. There is a lobular appearing nodular lesion in the posterior segment of the right upper lobe measuring 1.7 x 1.3 cm, best seen on axial slice 20 series 188. There is a lobular appearing nodule also in the posterior segment of the right upper lobe measuring 1.2 x 1.2 cm,  best seen on axial slice 26 series 416. There is a nodular lesion abutting the minor fissure in the posterior segment left upper lobe on slice 30 series 606 measuring 0.9 x 0.9 cm. There is a nodular lesion in the superior segment of the right lower lobe seen on axial slice 36 series 301  measuring 1.2 x 1.2 cm. There is an irregular nodular lesion in the superior segment of the right lower lobe measuring 1.2 x 1.1 cm on axial slice 38 series 979. There is an irregular spiculated lesion in the lateral segment of the right middle lobe measuring 2.8 x 2.3 cm. This lesion is best seen on axial slice 40 series 892. There is an area of consolidation in the posterior segment of the right lower lobe. There are scattered areas of presumed scarring in the lungs bilaterally.  There is a nodular lesion in the right lobe of the thyroid measuring 1.7 x 0.7 cm. There is a nodular lesion arising from the isthmus of the thyroid extending toward the left measuring 2.5 x 1.0 cm.  There are prominent right hilar lymph nodes. The largest individual lymph node measures 1.7 x 1.1 cm. A second right hilar lymph node measures 1.3 x 1.2 cm. No other adenopathy is appreciable.  There is left ventricular hypertrophy. There is calcification in multiple coronary artery regions. Pericardium is not thickened.  Visualized upper abdomen, the adrenals appear unremarkable. There is atherosclerotic change in aorta. There is equivocal hepatic steatosis.  There are no blastic or lytic bone lesions. There is degenerative change in the thoracic spine with areas of endplate concavity at several levels, likely due to underlying osteoporosis.  IMPRESSION: Underlying emphysema. Multiple pulmonary nodular lesions, largest in the right middle lobe measuring 2.8 x 2.3 cm. Multifocal neoplasm is of concern. Advise PET-CT to further evaluate.  Consolidation right base posteriorly. This appearance by CT is more consistent with pneumonia than neoplasm, although a  followup study in approximately 4 weeks to further evaluate this area may well be warranted.  Right hilar adenopathy.  Thyroid nodular lesions as noted above. Advise thyroid ultrasound to further evaluate.  These results will be called to the ordering clinician or representative by the Radiologist Assistant, and communication documented in the PACS or zVision Dashboard.  Electronically Signed: By: Lowella Grip III M.D. On: 01/04/2015 10:26   Ct Abdomen Pelvis W Contrast  01/05/2015    COMPARISON:  Chest CT dated 01/04/2015  FINDINGS: Evaluation is limited due to respiratory motion artifact.  The 4.6 x 2.8 cm pleural based masslike opacity is noted at the right lung base posteriorly.  No intra-abdominal free air or free fluid identified.  The liver appears unremarkable. There are multiple stones within the gallbladder. No pericholecystic fluid or evidence of gallbladder inflammation. The pancreas appears unremarkable. There is slight prominence of the head of the pancreas with extension into the duodenal C-loop. No discrete lesion identified. There is no atrophy of the gland or duct dilatation. The spleen appears unremarkable. A splenule is noted. There is apparent thickening of the left adrenal gland on the coronal view 58 be related to an underlying is normal. The right adrenal gland is unremarkable.  There is lobulated appearance of the renal cortices bilaterally. There is a 2.4 x 2.1 cm hypoenhancing lesion in the superior pole of the left kidney. Ultrasound is recommended for further characterization. Subcentimeter scattered bilateral renal hypodense lesions are too small to characterize. There is no hydronephrosis on either side. The visualized ureters and urinary bladder appear unremarkable. Hysterectomy.  There is sigmoid diverticulosis with muscular hypertrophy. No active inflammation. Moderate stool noted throughout the colon. There no evidence of bowel obstruction or inflammation.  Advanced  aortoiliac atherosclerotic disease. The origins of the celiac axis, SMA, IMA as well as the origins of the renal arteries are patent. No  portal venous gas identified. There is no lymphadenopathy.  Osteopenia with degenerative changes of the spine. There is compression deformity of the superior endplate of the K24 vertebra, age indeterminate, likely chronic. Clinical correlation is recommended. There is fracture of the greater trochanter of the left femur fossa on the prior CT dated 10/23/2014. Old left posterior rib fractures noted. No new fracture identified. Small scattered lucencies throughout the lumbar spine may be related to osteopenia or malignancy such as multiple myeloma or metastatic disease.  IMPRESSION: Partially visualized right lung base subpleural consolidation/mass as seen on the prior chest CT.  Left renal upper pole hypodense lesion. Ultrasound is recommended for further initial evaluation. MRI may be related for additional characterization depending on the ultrasound findings.  Cholelithiasis.  Sigmoid diverticulosis. No evidence of bowel obstruction or inflammation.  Osteopenia with fracture of the greater trochanter of the left femur as well as old left posterior rib fractures. Small lucencies throughout the lumbar vertebra may be related to osteopenia or represent multiple myeloma/ metastatic disease. Bone scan may provide better evaluation if clinically indicated.   Electronically Signed   By: Anner Crete M.D.   On: 01/05/2015 03:46     Assessment/Plan: 64 y.o.   Metastatic cancer At this point is of unknown primary, however it is suspected to be lung primary in the setting of heavy tobacco history CT of the head shows a 2.3 x 1.4 cm mass in the right setting of a large hemisphere, and a 2.5 x 2.1 cm mass in the left Centrum semiovale with white matter edema Multiple lung lesions including a spiculated lesion in the right middle lobe was 2.8 x 2.3 cm, was noted As mentioned  above, she also has bone and left renal upper pole lesion Intervention radiology, neurosurgical and radiation oncology consultations are pending at this time Tissue diagnosis is important to proceed with further recommendations, we will await the results of the diagnosis At this time, continue IV Decadron to reduce edema, with close monitoring of her blood sugars,  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 I agree with the above note. This is a very pleasant 64 years old white female almost her husband 10 days ago. She has been complaining of weight loss and increasing weakness and fatigue for the last few weeks. Patient started having gait abnormalities as well as weakness on the right side upper and lower extremities. She was brought to the emergency department for evaluation yesterday. CT scan of the head followed by MRI of the brain today showed 2 enhancing brain lesions the first one measured 3.4 x 2.5 cm in the inferior right cerebellar hemisphere and the other 2.8 x 2.1 cm in the posterior left frontal lobe each was mild surrounding vasogenic edema. CT scan of the chest, abdomen and pelvis performed showed multiple pulmonary nodular lesions the largest is in the right middle lobe and measured 2.8 x 2.3 cm. Multifocal neoplasm is a concern. CT of the abdomen showed a lobulated appearance of the renal cortices bilaterally and there was 2.4 x 2.1 cm hypoenhancing lesion in the superior pole of the left kidney. I was asked to see the  patient today for evaluation of her condition. When seen  today she continued to complain of fatigue and weakness especially in the right side upper and lower extremities. She denied having any significant chest pain but has shortness breath at baseline and increased with exertion with mild cough with no hemoptysis.  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  I had a lengthy discussion with the patient and her cousin today about her current disease status and treatment options. 1) I recommended neurosurgical evaluation for surgical resection of the inferior right cerebellar lesion for tissue diagnosis followed by stereotactic radiotherapy to the tumor cavity as well as the other brain lesion. Please consider consultation of radiation oncology. Continue the patient on Decadron for the vasogenic edema. 2) if the patient is not a candidate for surgical resection of one of the brain lesion, we would consider her for CT-guided core biopsy of one of the pulmonary nodule by interventional radiology. 3) the patient will need ultrasound or MRI of the kidney for further evaluation of the renal lesion. 4) the final pathology was consistent with adenocarcinoma, we will send the tissue block for molecular study to see if the patient would benefit from any target therapy. 5) I will arrange for the patient to come back for follow-up visit at the [facility] for reevaluation and more detailed discussion of her systemic therapy after completion of the brain radiation. Thank you for allowing me to participate in the care of Kristin Griffin, I will continue to follow up the patient with you and assist in her management an as-needed basis. Please call if you have any questions.  Disclaimer: This note was dictated with voice recognition software. Similar sounding words can inadvertently be transcribed and may be missed  upon review.

## 2015-01-05 NOTE — Progress Notes (Signed)
Inpatient Diabetes Program Recommendations  AACE/ADA: New Consensus Statement on Inpatient Glycemic Control (2015)  Target Ranges:  Prepandial:   less than 140 mg/dL      Peak postprandial:   less than 180 mg/dL (1-2 hours)      Critically ill patients:  140 - 180 mg/dL   Results for SHARALEE, WITMAN (MRN [id number]) as of 01/05/2015 13:46  Ref. Range 01/04/2015 09:11 01/04/2015 12:22 01/04/2015 16:56 01/05/2015 00:15  Glucose-Capillary Latest Ref Range: 65-99 mg/dL 128 (H) 309 (H) 363 (H) 128 (H)    Results for ZAAKIRAH, KISTNER (MRN [id number]) as of 01/05/2015 13:46  Ref. Range 01/05/2015 07:57 01/05/2015 11:44  Glucose-Capillary Latest Ref Range: 65-99 mg/dL 133 (H) 307 (H)   Review of Glycemic Control:  Current orders for Inpatient glycemic control: Novolog Moderate SSI (0-15 units) TID [facility]  Inpatient Diabetes Program Recommendations: Patient is eating 50-75% of meals.  Still having elevated glucose levels due to IV Decadron.    Please consider adding Novolog Meal Coverage while patient receiving IV steroids:  Novolog 3 units tid with meals    Will follow Wyn Quaker RN, MSN, CDE Diabetes Coordinator Inpatient Glycemic Control Team Team Pager: [phone] (8a-5p)

## 2015-01-06 ENCOUNTER — Ambulatory Visit
Admit: 2015-01-06 | Discharge: 2015-01-06 | Disposition: A | Discharge status: Home / Self Care | Payer: Medicare (Managed Care) | Attending: Radiation Oncology | Admitting: Radiation Oncology

## 2015-01-06 DIAGNOSIS — G939 Disorder of brain, unspecified: Secondary | ICD-10-CM

## 2015-01-06 DIAGNOSIS — R918 Other nonspecific abnormal finding of lung field: Secondary | ICD-10-CM | POA: Insufficient documentation

## 2015-01-06 DIAGNOSIS — M6289 Other specified disorders of muscle: Secondary | ICD-10-CM

## 2015-01-06 DIAGNOSIS — M899 Disorder of bone, unspecified: Secondary | ICD-10-CM | POA: Insufficient documentation

## 2015-01-06 DIAGNOSIS — R911 Solitary pulmonary nodule: Secondary | ICD-10-CM

## 2015-01-06 LAB — BASIC METABOLIC PANEL
Anion gap: 10 (ref 5–15)
BUN: 16 mg/dL (ref 6–20)
CHLORIDE: 100 mmol/L — AB (ref 101–111)
CO2: 25 mmol/L (ref 22–32)
CREATININE: 0.79 mg/dL (ref 0.44–1.00)
Calcium: 9.3 mg/dL (ref 8.9–10.3)
GFR calc non Af Amer: >60 mL/min (ref >60)
Glucose, Bld: 134 mg/dL — ABNORMAL HIGH (ref 65–99)
POTASSIUM: 3.9 mmol/L (ref 3.5–5.1)
Sodium: 135 mmol/L (ref 135–145)

## 2015-01-06 LAB — CBC
HEMATOCRIT: 38.3 % (ref 36.0–46.0)
HEMOGLOBIN: 12.9 g/dL (ref 12.0–15.0)
MCH: 32.1 pg (ref 26.0–34.0)
MCHC: 33.7 g/dL (ref 30.0–36.0)
MCV: 95.3 fL (ref 78.0–100.0)
Platelets: 364 10*3/uL (ref 150–400)
RBC: 4.02 MIL/uL (ref 3.87–5.11)
RDW: 14.3 % (ref 11.5–15.5)
WBC: 10.1 10*3/uL (ref 4.0–10.5)

## 2015-01-06 LAB — UIFE/LIGHT CHAINS/TP QN, 24-HR UR
% BETA, Urine: 0 %
ALBUMIN, U: 100 %
ALPHA 1 URINE: 0 %
Alpha 2, Urine: 0 %
FREE LT CHN EXCR RATE: 57.4 mg/L — AB (ref 1.35–24.19)
Free Kappa/Lambda Ratio: 9.78 (ref 2.04–10.37)
Free Lambda Lt Chains,Ur: 5.87 mg/L (ref 0.24–6.66)
GAMMA GLOBULIN URINE: 0 %
TOTAL PROTEIN, URINE-UPE24: 21.4 mg/dL

## 2015-01-06 LAB — PROTEIN ELECTROPHORESIS, SERUM
A/G Ratio: 1.2 (ref 0.7–1.7)
Albumin ELP: 3.2 g/dL (ref 2.9–4.4)
Alpha-1-Globulin: 0.2 g/dL (ref 0.0–0.4)
Alpha-2-Globulin: 0.6 g/dL (ref 0.4–1.0)
Beta Globulin: 1 g/dL (ref 0.7–1.3)
GLOBULIN, TOTAL: 2.6 g/dL (ref 2.2–3.9)
Gamma Globulin: 0.7 g/dL (ref 0.4–1.8)
TOTAL PROTEIN ELP: 5.8 g/dL — AB (ref 6.0–8.5)

## 2015-01-06 LAB — GLUCOSE, CAPILLARY
GLUCOSE-CAPILLARY: 111 mg/dL — AB (ref 65–99)
GLUCOSE-CAPILLARY: 169 mg/dL — AB (ref 65–99)
GLUCOSE-CAPILLARY: 148 mg/dL — AB (ref 65–99)
Glucose-Capillary: 178 mg/dL — ABNORMAL HIGH (ref 65–99)

## 2015-01-06 NOTE — Consult Note (Signed)
Name: Kristin Griffin MRN: [id number] DOB: 06/24/1950     ADMISSION DATE:  01/03/2015 CONSULTATION DATE:  9/14   REFERRING MD :  Tana Coast (triad)   CHIEF COMPLAINT:  Lung mass   BRIEF PATIENT DESCRIPTION: 64yo female former smoker with hx HTN presented 9/11 with 3 week hx R sided weakness, weight loss.  In ER CT head revealed bilateral masses consistent with metastatic disease.  CT chest revealed multiple pulmonary nodules, largest in RML.  PCCM consulted to assist with tissue dx.   SIGNIFICANT EVENTS    STUDIES:  CT chest 9/13>>> multiple pulmonary nodular lesions, largest RML 2.8x2.3cm.   CT abd/pelvis 9/13>> CT head 9/11>>>2.5 x 2.1 cm mass is noted in the left centrum semiovale with surrounding white matter edema. Also noted is 2.3 x 1.4 cm mass in right cerebellar hemisphere with surrounding white matter edema. These findings most consistent with metastatic disease MRI brain 9/13>>>1. Two enhancing brain masses, consistent with metastases. Mild surrounding edema without significant mass effect. 2. Diffusely heterogeneous bone marrow in the skull, nonspecific however metastatic disease is a consideration.   HISTORY OF PRESENT ILLNESS:  64yo female with hx HTN presented 9/11 with 3 week hx R sided weakness, weight loss.  In ER CT head revealed bilateral masses consistent with metastatic disease.  CT chest revealed multiple pulmonary nodules, largest in RML.  PCCM consulted to assist with tissue dx.   Currently c/o mild R pleuritic chest pain.  Prior to admit denies fever, cough, chills, hemoptysis, orthopnea, purulent sputum. Denies headache, visual disturbances, syncope.  Quit smoking 2015.  45 pack year hx.   PAST MEDICAL HISTORY :   has a past medical history of Hypertension.  has past surgical history that includes Abdominal hysterectomy. Prior to Admission medications   Medication Sig Start Date End Date Taking? Authorizing Provider  calcium carbonate (OS-CAL) 600 MG TABS  tablet Take 600 mg by mouth daily with breakfast.    Yes Historical Provider, MD  Multiple Vitamin (MULTIVITAMIN) capsule Take 1 capsule by mouth daily.   Yes Historical Provider, MD  Omega-3 Fatty Acids (FISH OIL) 1000 MG CAPS Take by mouth.   Yes Historical Provider, MD  verapamil (CALAN-SR) 240 MG CR tablet TAKE 1 TABLET BY MOUTH AT BEDTIME 11/18/14  Yes Chelle Jeffery, PA-C  alendronate (FOSAMAX) 70 MG tablet Take 1 tablet (70 mg total) by mouth every 7 (seven) days. Take with a full glass of water on an empty stomach. Patient not taking: Reported on 01/01/2015 12/21/13   Shawnee Knapp, MD  gentamicin (GARAMYCIN) 0.3 % ophthalmic solution Place 2 drops into the left eye every 4 (four) hours. Ok to use 2 drops every hour until you begin to improve. 02/03/14   Shawnee Knapp, MD  lisinopril-hydrochlorothiazide (PRINZIDE,ZESTORETIC) 20-25 MG per tablet take 1 tablet by mouth once daily 11/16/14   Shawnee Knapp, MD   No Known Allergies  FAMILY HISTORY:  family history is not on file. SOCIAL HISTORY:  reports that she quit smoking about 14 months ago. She does not have any smokeless tobacco history on file. She reports that she drinks alcohol. She reports that she does not use illicit drugs.  REVIEW OF SYSTEMS:   As per HPI - All other systems reviewed and were neg.    SUBJECTIVE:  No acute c/o.  Anxious about bx.    VITAL SIGNS: Temp:  [97.3 F (36.3 C)-98 F (36.7 C)] 97.3 F (36.3 C) (09/14 1110) Pulse Rate:  [70-88] 70 (  09/14 1110) Resp:  [13-16] 16 (09/14 1110) BP: (116-149)/(61-83) 116/61 mmHg (09/14 1110) SpO2:  [94 %-97 %] 95 % (09/14 1110)  PHYSICAL EXAMINATION: General:  Thin female, NAD in bed  Neuro:  Awake, alert, oriented, appropriate HEENT:  Mm moist, no JVD  Cardiovascular:  s1s2 rrr Lungs:  resps even non labored on RA, few scattered rhonchi R>L Abdomen:  Soft, non tender  Musculoskeletal:  Warm and dry, no edema    Recent Labs Lab 01/04/15 0440 01/05/15 0534  01/06/15 0524  NA 139 137 135  K 3.0* 4.5 3.9  CL 104 105 100*  CO2 '22 24 25  ' BUN '6 11 16  ' CREATININE 0.76 0.73 0.79  GLUCOSE 106* 139* 134*    Recent Labs Lab 01/03/15 1819 01/03/15 1841 01/04/15 0440 01/06/15 0524  HGB 15.0 16.3* 13.9 12.9  HCT 42.9 48.0* 41.2 38.3  WBC 9.0  --  6.0 10.1  PLT 380  --  353 364   Mr Brain W Wo Contrast  01/05/2015   CLINICAL DATA:  Right-sided hemi paresis. Unintentional weight loss. Brain masses on CT. Evidence of metastatic disease.  EXAM: MRI HEAD WITHOUT AND WITH CONTRAST  TECHNIQUE: Multiplanar, multiecho pulse sequences of the brain and surrounding structures were obtained without and with intravenous contrast.  CONTRAST:  29m MULTIHANCE GADOBENATE DIMEGLUMINE 529 MG/ML IV SOLN  COMPARISON:  Head CT 01/03/2015  FINDINGS: There is no evidence of acute infarct, midline shift, or extra-axial fluid collection. There is mild to moderate generalized cerebral atrophy. Patchy periventricular and subcortical white matter T2 hyperintensities are nonspecific but compatible with mild-to-moderate chronic small vessel ischemic disease.  Brain masses described on the recent CT demonstrate solid enhancement and measure 3.4 x 2.5 cm in the inferior right cerebellar hemisphere and 2.8 x 2.1 cm in the posterior left frontal lobe, each with mild surrounding vasogenic edema. Both lesions demonstrate areas of susceptibility artifact consistent with chronic blood products. A separate focus of chronic microhemorrhage is noted slightly more anteriorly in the right cerebellum without associated enhancement. There is a prominent vessel which courses into the superior aspect of the left frontal lesion. No other enhancing brain lesions are identified.  The bone marrow signal of the skull is diffusely heterogeneous on precontrast T1 weighted images and demonstrates diffusely heterogeneous enhancement after contrast administration. No destructive skull lesion is identified, however  there are scattered subcentimeter foci of discrete enhancement. No dural-based mass is seen, although the right cerebellar mass does extend towards the dural surface.  Orbits are unremarkable. Paranasal sinuses and mastoid air cells are clear. Major intracranial vascular flow voids are preserved.  IMPRESSION: 1. Two enhancing brain masses, consistent with metastases. Mild surrounding edema without significant mass effect. 2. Diffusely heterogeneous bone marrow in the skull, nonspecific however metastatic disease is a consideration.   Electronically Signed   By: ALogan BoresM.D.   On: 01/05/2015 13:37   Nm Bone Scan Whole Body  01/05/2015   CLINICAL DATA:  Brain metastases, as well as lucent lesions in the calvarium concerning for bone mets  EXAM: NUCLEAR MEDICINE WHOLE BODY BONE SCAN  TECHNIQUE: Whole body anterior and posterior images were obtained approximately 3 hours after intravenous injection of radiopharmaceutical.  RADIOPHARMACEUTICALS:  25 mCi Technetium-960mDP IV  COMPARISON:  Multiple studies performed september 2016.  FINDINGS: Multiple foci of increased uptake involving the bilateral ribs. These appear to correspond to numerous rib fractures visible on the 01/04/15 CT scan. Lesion left femoral neck and trochanters consistent with fracture  seen on same CT scan. Focus of uptake distal left forearm. Minimally heterogenous uptake in the calvarium without definite focal metastatic lesion there.  Degenerative uptake in the knees, right worse than left. Degenerative uptake in the ankles and probably degenerative uptake in the bilateral hindfeet.Degenerative uptake bilateral AC joints.  IMPRESSION: Lesions bilateral ribs and left femur consistent with fractures visible on recent CT scan.  Uptake distal left forearm:  Forearm radiographs recommended.  Minimally heterogenous uptake in the calvarium without definitive metastasis in this area.   Electronically Signed   By: Skipper Cliche M.D.   On: 01/05/2015  17:00   Ct Abdomen Pelvis W Contrast  01/05/2015   CLINICAL DATA:  64 year old female with concern for malignancy  EXAM: CT ABDOMEN AND PELVIS WITH CONTRAST  TECHNIQUE: Multidetector CT imaging of the abdomen and pelvis was performed using the standard protocol following bolus administration of intravenous contrast.  CONTRAST:  49m OMNIPAQUE IOHEXOL 300 MG/ML  SOLN  COMPARISON:  Chest CT dated 01/04/2015  FINDINGS: Evaluation is limited due to respiratory motion artifact.  The 4.6 x 2.8 cm pleural based masslike opacity is noted at the right lung base posteriorly.  No intra-abdominal free air or free fluid identified.  The liver appears unremarkable. There are multiple stones within the gallbladder. No pericholecystic fluid or evidence of gallbladder inflammation. The pancreas appears unremarkable. There is slight prominence of the head of the pancreas with extension into the duodenal C-loop. No discrete lesion identified. There is no atrophy of the gland or duct dilatation. The spleen appears unremarkable. A splenule is noted. There is apparent thickening of the left adrenal gland on the coronal view 58 be related to an underlying is normal. The right adrenal gland is unremarkable.  There is lobulated appearance of the renal cortices bilaterally. There is a 2.4 x 2.1 cm hypoenhancing lesion in the superior pole of the left kidney. Ultrasound is recommended for further characterization. Subcentimeter scattered bilateral renal hypodense lesions are too small to characterize. There is no hydronephrosis on either side. The visualized ureters and urinary bladder appear unremarkable. Hysterectomy.  There is sigmoid diverticulosis with muscular hypertrophy. No active inflammation. Moderate stool noted throughout the colon. There no evidence of bowel obstruction or inflammation.  Advanced aortoiliac atherosclerotic disease. The origins of the celiac axis, SMA, IMA as well as the origins of the renal arteries are  patent. No portal venous gas identified. There is no lymphadenopathy.  Osteopenia with degenerative changes of the spine. There is compression deformity of the superior endplate of the [id number], age indeterminate, likely chronic. Clinical correlation is recommended. There is fracture of the greater trochanter of the left femur fossa on the prior CT dated 10/23/2014. Old left posterior rib fractures noted. No new fracture identified. Small scattered lucencies throughout the lumbar spine may be related to osteopenia or malignancy such as multiple myeloma or metastatic disease.  IMPRESSION: Partially visualized right lung base subpleural consolidation/mass as seen on the prior chest CT.  Left renal upper pole hypodense lesion. Ultrasound is recommended for further initial evaluation. MRI may be related for additional characterization depending on the ultrasound findings.  Cholelithiasis.  Sigmoid diverticulosis. No evidence of bowel obstruction or inflammation.  Osteopenia with fracture of the greater trochanter of the left femur as well as old left posterior rib fractures. Small lucencies throughout the lumbar vertebra may be related to osteopenia or represent multiple myeloma/ metastatic disease. Bone scan may provide better evaluation if clinically indicated.   Electronically Signed  By: Anner Crete M.D.   On: 01/05/2015 03:46   US Renal  01/05/2015   CLINICAL DATA:  Hypertension.  Indeterminate left renal mass on CT.  EXAM: RENAL / URINARY TRACT ULTRASOUND COMPLETE  COMPARISON:  CT of 01/04/2015.  FINDINGS: Right Kidney:  Length: 9.7 cm. No hydronephrosis. Normal renal cortical thickness and echogenicity.  Left Kidney:  Length: 9.8 cm. No hydronephrosis. Normal renal cortical thickness and echogenicity. Tiny left renal cysts identified. The upper pole dominant left renal lesion described on prior CT is not readily identified or well evaluated.  Bladder:  Appears normal for degree of bladder distention.   IMPRESSION: 1. The upper pole left renal indeterminate lesion on CT is not well evaluated or visualized. Recommend nonemergent outpatient pre and post contrast abdominal MRI. 2.  No acute findings.   Electronically Signed   By: Abigail Miyamoto M.D.   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  Bilat brain mass - most certainly foci of metastatic disease.  COPD   PLAN -  Nodules not amenable to conventional FOB D/w IR who feel that nodules also not amenable to Needle bx-- will plan for ENB 9/14 Neurosurgery, oncology following  Continue decadron  Supplemental O2 as needed  NPO after MN  Nickolas Madrid, NP 01/06/2015  11:43 AM Pager: [phone] or [phone]

## 2015-01-06 NOTE — Progress Notes (Signed)
[facility]                                                                              Patient Demographics  Kristin Griffin, is a 64 y.o. female, DOB - 01-Jun-1950, MLY:[id number]  Admit date - 01/03/2015   Admitting Physician Theressa Millard, MD  Outpatient Primary MD for the patient is Delman Cheadle, MD  LOS - 3   Chief Complaint  Patient presents with  . Weakness       Brief HPI   Kristin Griffin is a 64 y.o. female with a history of HTN who presents to the ED with complaints of Right sided Weakness and progressive decline for the past 3 weeks.The patient had presented with unintentional weight loss of 10 pounds in the past 5 weeks, no fevers or chills. Patient's brothers notice that she was walking and dragging her right leg and brought her to the ED. CT head showed 2.3 into 1.4 cm mass in the right cerebellar hemisphere, 2.5 and 2.1 cm mass in the left centrum semiovale with white matter edema. It was followed by a CT chest with contrast to evaluate for a primary. CT chest showed multiple pulmonary nodules. Patient was referred to admission.  Assessment & Plan    Principal Problem:   Brain mass, right lung mass, bony lucencies in the lumbar spine, renal lesion, underlying smoking history one pack per day for 45-50 years - CT head showed 2.3 into 1.4 cm mass in the right cerebellar hemisphere, 2.5 and 2.1 cm mass in the left centrum semiovale with white matter edema.  - Patient started on IV Decadron - Neurosurgery, Onc consulted. Rad Onc consulted, d/w Dr Sondra Come - pulmonology consulted, plan for EBUS and lung biopsy tomorrow   Right lung mass - Patient has multiple lung masses, including spiculated lesion in the right middle lobe 2.8X2.3 centimeter, underlying emphysema, multifocal neoplasm also of concern - pulmonology consulted, plan for EBUS and lung biopsy tomorrow - Onc consulted, Dr Earlie Server following  - Patient was placed on levofloxacin for  possible postobstructive pneumonia as per CT chest  Left renal upper pole lesion, lumbar vertebra lesions - Renal ultrasound upper left renal lesion not visualized, recommend nonemergent MRI   Protein-calorie malnutrition, severe with underlying possible malignancy - Nutrition consult  Right-sided weakness likely due to the brain masses and edema - Continue physical therapy, IV Decadron  Bone lesions: Bone scan: Lesions bilateral ribs and left femur consistent with fractures visible on recent CT scan. No mets   Hypertension Currently stable, continue verapamil  Code Status: Full code  Family Communication: Discussed in detail with the patient, all imaging results, lab results explained to the patient.   Disposition Plan: Not medically ready  Time Spent in minutes   82mnutes  Procedures  CT head CT chest, CT abdomen and pelvis  Consults   Pulm IR Neurosurgery oncology   DVT Prophylaxis  heparin   Medications  Scheduled Meds: . calcium carbonate  1 tablet Oral Q breakfast  . dexamethasone  4 mg Intravenous 4 times per day  . feeding supplement (ENSURE ENLIVE)  237 mL Oral BID BM  .  heparin subcutaneous  5,000 Units Subcutaneous 3 times per day  . insulin aspart  0-15 Units Subcutaneous TID WC  . insulin aspart  3 Units Subcutaneous TID WC  . levofloxacin  750 mg Oral Daily  . multivitamin with minerals  1 tablet Oral Daily  . omega-3 acid ethyl esters  1 g Oral Daily  . pantoprazole  40 mg Oral QHS  . sodium chloride  3 mL Intravenous Q12H  . verapamil  240 mg Oral QHS   Continuous Infusions:  PRN Meds:.sodium chloride, acetaminophen **OR** acetaminophen, alum & mag hydroxide-simeth, HYDROmorphone (DILAUDID) injection, ondansetron **OR** ondansetron (ZOFRAN) IV, oxyCODONE, sodium chloride   Antibiotics   Anti-infectives    Start     Dose/Rate Route Frequency Ordered Stop   01/05/15 1100  levofloxacin (LEVAQUIN) tablet 750 mg     750 mg Oral Daily 01/05/15  1028     01/04/15 1200  levofloxacin (LEVAQUIN) IVPB 750 mg  Status:  Discontinued     750 mg 100 mL/hr over 90 Minutes Intravenous Every 24 hours 01/04/15 1111 01/05/15 1028        Subjective:   Kristin Griffin was seen and examined today.  C/o headaches. Patient denies dizziness, chest pain, shortness of breath, abdominal pain, N/V/D/C. Eating breakfast, sister at the bedside   Objective:   Blood pressure 116/61, pulse 70, temperature 97.3 F (36.3 C), temperature source Oral, resp. rate 16, height '5\' 6"'  (1.676 m), weight 47.7 kg (105 lb 2.6 oz), SpO2 95 %.  Wt Readings from Last 3 Encounters:  01/03/15 47.7 kg (105 lb 2.6 oz)  02/03/14 51.166 kg (112 lb 12.8 oz)  11/21/13 51.256 kg (113 lb)     Intake/Output Summary (Last 24 hours) at 01/06/15 1234 Last data filed at 01/06/15 0900  Gross per 24 hour  Intake    440 ml  Output    700 ml  Net   -260 ml    Exam  General: Alert and oriented x 3, NAD  HEENT:  PERRLA, EOMI, Anicteric Sclera, mucous membranes moist.   Neck: Supple, no JVD, no masses  CVS: S1 S2 auscultated, no rubs, murmurs or gallops. Regular rate and rhythm.  Respiratory: Clear to auscultation bilaterally, no wheezing, rales or rhonchi  Abdomen: Soft, nontender, nondistended, + bowel sounds  Ext: no cyanosis clubbing or edema  Neuro: no new weakness  Skin: No rashes  Psych: Normal affect and demeanor, alert and oriented x3    Data Review   Micro Results Recent Results (from the past 240 hour(s))  Urine culture     Status: None   Collection Time: 01/03/15  8:10 PM  Result Value Ref Range Status   Specimen Description URINE, CLEAN CATCH  Final   Special Requests NONE  Final   Culture MULTIPLE SPECIES PRESENT, SUGGEST RECOLLECTION  Final   Report Status 01/05/2015 FINAL  Final    Radiology Reports Dg Chest 2 View  01/03/2015   CLINICAL DATA:  Initial evaluation for acute right-sided weakness.  EXAM: CHEST  2 VIEW  COMPARISON:  None.   FINDINGS: Transverse heart size at the upper limits of normal. Mediastinal silhouette within normal limits. Atheromatous plaque present within the aortic arch.  Lungs are mildly hyperinflated with attenuation of the pulmonary markings, consistent with emphysema. Minimal left basilar atelectasis/scarring. Similarly, probable parenchymal scarring within the peripheral right lower lobe. No focal infiltrate, pulmonary edema, or pleural effusion. No pneumothorax. There is a 15 mm nodular and somewhat lobulated density overlying the right upper  lobe, indeterminate.  Osteopenia noted.  No acute osseus abnormality.  IMPRESSION: 1. Emphysema.  No superimposed active cardiopulmonary disease. 2. 14 mm nodular density within the right upper lobe. Follow-up examination with cross-sectional imaging of the chest is recommended for further evaluation.   Electronically Signed   By: Jeannine Boga M.D.   On: 01/03/2015 21:39   Ct Head Wo Contrast  01/03/2015   CLINICAL DATA:  Right-sided hemiparesis.  EXAM: CT HEAD WITHOUT CONTRAST  TECHNIQUE: Contiguous axial images were obtained from the base of the skull through the vertex without intravenous contrast.  COMPARISON:  None.  FINDINGS: 2.5 x 2.1 cm mass is noted in the left centrum semiovale with surrounding white matter edema. Also noted is 2.3 x 1.4 cm mass in right cerebellar hemisphere with surrounding white matter edema. These findings most consistent with metastatic disease. Ventricular size is within normal limits. No significant midline shift is seen at this time. No definite hemorrhage is noted. Multiple small lucencies are noted in the bony calvarium which potentially may represent metastatic disease.  IMPRESSION: Masses with surrounding white matter edema are noted in the left parietal lobe and right cerebellar hemisphere most consistent with metastatic disease. Also noted are multiple small lucencies in the bony calvarium which may represent metastatic disease.  MRI with and without gadolinium is recommended for further evaluation.   Electronically Signed   By: Marijo Conception, M.D.   On: 01/03/2015 19:40   Ct Chest W Contrast  01/04/2015   ADDENDUM REPORT: 01/04/2015 10:31  ADDENDUM: There is atherosclerotic change in aorta but no aneurysm. No thoracic aortic aneurysm or dissection. No pulmonary embolus appreciable. There are scattered foci of coronary artery calcification.   Electronically Signed   By: Lowella Grip III M.D.   On: 01/04/2015 10:31   01/04/2015   CLINICAL DATA:  Pulmonary nodular lesion on chest radiograph  EXAM: CT CHEST WITH CONTRAST  TECHNIQUE: Multidetector CT imaging of the chest was performed during intravenous contrast administration.  CONTRAST:  33m OMNIPAQUE IOHEXOL 300 MG/ML  SOLN  COMPARISON:  Chest radiograph January 03, 2015  FINDINGS: There is underlying centrilobular emphysematous change. There is an irregular opacity with mild adjacent peribronchial thickening in the right apex measuring 1.4 x 0.6 cm, best seen on axial slice 9 series 2350 There is a lobular appearing nodular lesion in the posterior segment of the right upper lobe measuring 1.7 x 1.3 cm, best seen on axial slice 20 series 2093 There is a lobular appearing nodule also in the posterior segment of the right upper lobe measuring 1.2 x 1.2 cm, best seen on axial slice 26 series 2818 There is a nodular lesion abutting the minor fissure in the posterior segment left upper lobe on slice 30 series 2299measuring 0.9 x 0.9 cm. There is a nodular lesion in the superior segment of the right lower lobe seen on axial slice 36 series 2371measuring 1.2 x 1.2 cm. There is an irregular nodular lesion in the superior segment of the right lower lobe measuring 1.2 x 1.1 cm on axial slice 38 series 2696 There is an irregular spiculated lesion in the lateral segment of the right middle lobe measuring 2.8 x 2.3 cm. This lesion is best seen on axial slice 40 series 2789 There is an area  of consolidation in the posterior segment of the right lower lobe. There are scattered areas of presumed scarring in the lungs bilaterally.  There is a nodular lesion in the right lobe of  the thyroid measuring 1.7 x 0.7 cm. There is a nodular lesion arising from the isthmus of the thyroid extending toward the left measuring 2.5 x 1.0 cm.  There are prominent right hilar lymph nodes. The largest individual lymph node measures 1.7 x 1.1 cm. A second right hilar lymph node measures 1.3 x 1.2 cm. No other adenopathy is appreciable.  There is left ventricular hypertrophy. There is calcification in multiple coronary artery regions. Pericardium is not thickened.  Visualized upper abdomen, the adrenals appear unremarkable. There is atherosclerotic change in aorta. There is equivocal hepatic steatosis.  There are no blastic or lytic bone lesions. There is degenerative change in the thoracic spine with areas of endplate concavity at several levels, likely due to underlying osteoporosis.  IMPRESSION: Underlying emphysema. Multiple pulmonary nodular lesions, largest in the right middle lobe measuring 2.8 x 2.3 cm. Multifocal neoplasm is of concern. Advise PET-CT to further evaluate.  Consolidation right base posteriorly. This appearance by CT is more consistent with pneumonia than neoplasm, although a followup study in approximately 4 weeks to further evaluate this area may well be warranted.  Right hilar adenopathy.  Thyroid nodular lesions as noted above. Advise thyroid ultrasound to further evaluate.  These results will be called to the ordering clinician or representative by the Radiologist Assistant, and communication documented in the PACS or zVision Dashboard.  Electronically Signed: By: Lowella Grip III M.D. On: 01/04/2015 10:26   Mr Jeri Cos ZO Contrast  01/05/2015   CLINICAL DATA:  Right-sided hemi paresis. Unintentional weight loss. Brain masses on CT. Evidence of metastatic disease.  EXAM: MRI HEAD WITHOUT  AND WITH CONTRAST  TECHNIQUE: Multiplanar, multiecho pulse sequences of the brain and surrounding structures were obtained without and with intravenous contrast.  CONTRAST:  80m MULTIHANCE GADOBENATE DIMEGLUMINE 529 MG/ML IV SOLN  COMPARISON:  Head CT 01/03/2015  FINDINGS: There is no evidence of acute infarct, midline shift, or extra-axial fluid collection. There is mild to moderate generalized cerebral atrophy. Patchy periventricular and subcortical white matter T2 hyperintensities are nonspecific but compatible with mild-to-moderate chronic small vessel ischemic disease.  Brain masses described on the recent CT demonstrate solid enhancement and measure 3.4 x 2.5 cm in the inferior right cerebellar hemisphere and 2.8 x 2.1 cm in the posterior left frontal lobe, each with mild surrounding vasogenic edema. Both lesions demonstrate areas of susceptibility artifact consistent with chronic blood products. A separate focus of chronic microhemorrhage is noted slightly more anteriorly in the right cerebellum without associated enhancement. There is a prominent vessel which courses into the superior aspect of the left frontal lesion. No other enhancing brain lesions are identified.  The bone marrow signal of the skull is diffusely heterogeneous on precontrast T1 weighted images and demonstrates diffusely heterogeneous enhancement after contrast administration. No destructive skull lesion is identified, however there are scattered subcentimeter foci of discrete enhancement. No dural-based mass is seen, although the right cerebellar mass does extend towards the dural surface.  Orbits are unremarkable. Paranasal sinuses and mastoid air cells are clear. Major intracranial vascular flow voids are preserved.  IMPRESSION: 1. Two enhancing brain masses, consistent with metastases. Mild surrounding edema without significant mass effect. 2. Diffusely heterogeneous bone marrow in the skull, nonspecific however metastatic disease is  a consideration.   Electronically Signed   By: ALogan BoresM.D.   On: 01/05/2015 13:37   Nm Bone Scan Whole Body  01/05/2015   CLINICAL DATA:  Brain metastases, as well as lucent lesions in the calvarium  concerning for bone mets  EXAM: NUCLEAR MEDICINE WHOLE BODY BONE SCAN  TECHNIQUE: Whole body anterior and posterior images were obtained approximately 3 hours after intravenous injection of radiopharmaceutical.  RADIOPHARMACEUTICALS:  25 mCi Technetium-6mMDP IV  COMPARISON:  Multiple studies performed september 2016.  FINDINGS: Multiple foci of increased uptake involving the bilateral ribs. These appear to correspond to numerous rib fractures visible on the 01/04/15 CT scan. Lesion left femoral neck and trochanters consistent with fracture seen on same CT scan. Focus of uptake distal left forearm. Minimally heterogenous uptake in the calvarium without definite focal metastatic lesion there.  Degenerative uptake in the knees, right worse than left. Degenerative uptake in the ankles and probably degenerative uptake in the bilateral hindfeet.Degenerative uptake bilateral AC joints.  IMPRESSION: Lesions bilateral ribs and left femur consistent with fractures visible on recent CT scan.  Uptake distal left forearm:  Forearm radiographs recommended.  Minimally heterogenous uptake in the calvarium without definitive metastasis in this area.   Electronically Signed   By: RSkipper ClicheM.D.   On: 01/05/2015 17:00   Ct Abdomen Pelvis W Contrast  01/05/2015   CLINICAL DATA:  64year old female with concern for malignancy  EXAM: CT ABDOMEN AND PELVIS WITH CONTRAST  TECHNIQUE: Multidetector CT imaging of the abdomen and pelvis was performed using the standard protocol following bolus administration of intravenous contrast.  CONTRAST:  848mOMNIPAQUE IOHEXOL 300 MG/ML  SOLN  COMPARISON:  Chest CT dated 01/04/2015  FINDINGS: Evaluation is limited due to respiratory motion artifact.  The 4.6 x 2.8 cm pleural based masslike  opacity is noted at the right lung base posteriorly.  No intra-abdominal free air or free fluid identified.  The liver appears unremarkable. There are multiple stones within the gallbladder. No pericholecystic fluid or evidence of gallbladder inflammation. The pancreas appears unremarkable. There is slight prominence of the head of the pancreas with extension into the duodenal C-loop. No discrete lesion identified. There is no atrophy of the gland or duct dilatation. The spleen appears unremarkable. A splenule is noted. There is apparent thickening of the left adrenal gland on the coronal view 58 be related to an underlying is normal. The right adrenal gland is unremarkable.  There is lobulated appearance of the renal cortices bilaterally. There is a 2.4 x 2.1 cm hypoenhancing lesion in the superior pole of the left kidney. Ultrasound is recommended for further characterization. Subcentimeter scattered bilateral renal hypodense lesions are too small to characterize. There is no hydronephrosis on either side. The visualized ureters and urinary bladder appear unremarkable. Hysterectomy.  There is sigmoid diverticulosis with muscular hypertrophy. No active inflammation. Moderate stool noted throughout the colon. There no evidence of bowel obstruction or inflammation.  Advanced aortoiliac atherosclerotic disease. The origins of the celiac axis, SMA, IMA as well as the origins of the renal arteries are patent. No portal venous gas identified. There is no lymphadenopathy.  Osteopenia with degenerative changes of the spine. There is compression deformity of the superior endplate of the T1H85ertebra, age indeterminate, likely chronic. Clinical correlation is recommended. There is fracture of the greater trochanter of the left femur fossa on the prior CT dated 10/23/2014. Old left posterior rib fractures noted. No new fracture identified. Small scattered lucencies throughout the lumbar spine may be related to osteopenia or  malignancy such as multiple myeloma or metastatic disease.  IMPRESSION: Partially visualized right lung base subpleural consolidation/mass as seen on the prior chest CT.  Left renal upper pole hypodense lesion. Ultrasound is recommended  for further initial evaluation. MRI may be related for additional characterization depending on the ultrasound findings.  Cholelithiasis.  Sigmoid diverticulosis. No evidence of bowel obstruction or inflammation.  Osteopenia with fracture of the greater trochanter of the left femur as well as old left posterior rib fractures. Small lucencies throughout the lumbar vertebra may be related to osteopenia or represent multiple myeloma/ metastatic disease. Bone scan may provide better evaluation if clinically indicated.   Electronically Signed   By: Anner Crete M.D.   On: 01/05/2015 03:46   US Renal  01/05/2015   CLINICAL DATA:  Hypertension.  Indeterminate left renal mass on CT.  EXAM: RENAL / URINARY TRACT ULTRASOUND COMPLETE  COMPARISON:  CT of 01/04/2015.  FINDINGS: Right Kidney:  Length: 9.7 cm. No hydronephrosis. Normal renal cortical thickness and echogenicity.  Left Kidney:  Length: 9.8 cm. No hydronephrosis. Normal renal cortical thickness and echogenicity. Tiny left renal cysts identified. The upper pole dominant left renal lesion described on prior CT is not readily identified or well evaluated.  Bladder:  Appears normal for degree of bladder distention.  IMPRESSION: 1. The upper pole left renal indeterminate lesion on CT is not well evaluated or visualized. Recommend nonemergent outpatient pre and post contrast abdominal MRI. 2.  No acute findings.   Electronically Signed   By: Abigail Miyamoto M.D.   On: 01/05/2015 20:42    CBC  Recent Labs Lab 01/01/15 1524 01/03/15 1819 01/03/15 1841 01/04/15 0440 01/06/15 0524  WBC 7.2 9.0  --  6.0 10.1  HGB 14.9 15.0 16.3* 13.9 12.9  HCT 45.5 42.9 48.0* 41.2 38.3  PLT  --  380  --  353 364  MCV 93.7 92.9  --  93.4 95.3   MCH 30.7 32.5  --  31.5 32.1  MCHC 32.7 35.0  --  33.7 33.7  RDW  --  13.6  --  13.7 14.3  LYMPHSABS  --  1.8  --   --   --   MONOABS  --  0.8  --   --   --   EOSABS  --  0.1  --   --   --   BASOSABS  --  0.0  --   --   --     Chemistries   Recent Labs Lab 01/01/15 1508 01/03/15 1819 01/03/15 1841 01/04/15 0440 01/05/15 0534 01/06/15 0524  NA 142 138 140 139 137 135  K 4.6 3.3* 3.4* 3.0* 4.5 3.9  CL 98 103 103 104 105 100*  CO2 28 23  --  '22 24 25  ' GLUCOSE 92 101* 102* 106* 139* 134*  BUN '9 9 11 6 11 16  ' CREATININE 0.73 0.79 0.80 0.76 0.73 0.79  CALCIUM 10.2 9.6  --  9.1 9.6 9.3  AST 20 23  --   --   --   --   ALT 12 13*  --   --   --   --   ALKPHOS 99 95  --   --   --   --   BILITOT 0.7 0.7  --   --   --   --    ------------------------------------------------------------------------------------------------------------------ estimated creatinine clearance is 52.8 mL/min (by C-G formula based on Cr of 0.79). ------------------------------------------------------------------------------------------------------------------  Recent Labs  01/04/15 0440  HGBA1C 5.2   ------------------------------------------------------------------------------------------------------------------ No results for input(s): CHOL, HDL, LDLCALC, TRIG, CHOLHDL, LDLDIRECT in the last 72 hours. ------------------------------------------------------------------------------------------------------------------ No results for input(s): TSH, T4TOTAL, T3FREE, THYROIDAB in the last 72 hours.  Invalid  input(s): FREET3 ------------------------------------------------------------------------------------------------------------------ No results for input(s): VITAMINB12, FOLATE, FERRITIN, TIBC, IRON, RETICCTPCT in the last 72 hours.  Coagulation profile  Recent Labs Lab 01/03/15 1819  INR 0.98    No results for input(s): DDIMER in the last 72 hours.  Cardiac Enzymes No results for input(s):  CKMB, TROPONINI, MYOGLOBIN in the last 168 hours.  Invalid input(s): CK ------------------------------------------------------------------------------------------------------------------ Invalid input(s): POCBNP   Recent Labs  01/05/15 0015 01/05/15 0757 01/05/15 1144 01/05/15 1700 01/05/15 2217 01/06/15 0809  GLUCAP 128* 133* 307* 131* 105* 111*     RAI,RIPUDEEP M.D. [facility] 01/06/2015, 12:34 PM  Pager: [phone] Between 7am to 7pm - call Pager - [phone]  After 7pm go to [email or web address] - password [email or web address]  Call night coverage person covering after 7pm

## 2015-01-06 NOTE — Consult Note (Signed)
Kristin Bogdan EdD 

## 2015-01-06 NOTE — Clinical Social Work Note (Signed)
CSW attempted to complete assessment with patient. Patient requested CSW come back at a later time. CSW explained that CSW may not be able to return until tomorrow morning to discuss SNF placement process. Patient is ok with this.   Liz Beach MSW, [place], [place], [phone]

## 2015-01-06 NOTE — Care Management Important Message (Signed)
Important Message  Patient Details  Name: Kristin Griffin MRN: [id number] Date of Birth: [birth year]   Medicare Important Message Given:  Yes-second notification given    [name] May 01/06/2015, 11:57 AM

## 2015-01-06 NOTE — Progress Notes (Signed)
Physical Therapy Treatment Patient Details Name: Kristin Griffin MRN: [id number] DOB: 1950/12/16 Today's Date: 01/06/2015    History of Present Illness 64 y.o. female presents with complaints of Right sided Weakness and progressive decline for the past 3 weeks.  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    PT Comments    Patient progressing with mobility, session focused on gait training with external cues and multimodal facilitation. Patient also showing improved cognition compared to previous session. Will continues to see as indicated and progress as tolerated.   Follow Up Recommendations  CIR;Supervision/Assistance - 24 hour     Equipment Recommendations  Other (comment) (TBD)    Recommendations for Other Services Rehab consult     Precautions / Restrictions Precautions Precautions: Fall Restrictions Weight Bearing Restrictions: No    Mobility  Bed Mobility Overal bed mobility: Needs Assistance Bed Mobility: Supine to Sit;Sit to Supine     Supine to sit: Min assist Sit to supine: Min assist   General bed mobility comments: VCs for compensetory techniques and attention to right side, patient able to carry out instruction with external cues but not internally driving corrections on her own  Transfers Overall transfer level: Needs assistance Equipment used: 1 person hand held assist Transfers: Sit to/from Stand Sit to Stand: Min assist         General transfer comment: VCs for hand placement, continues to demonstrates inattention to RUE during transfer, Wrap around support and use of gait belt to initiate and carry out standing  Ambulation/Gait Ambulation/Gait assistance: Mod assist Ambulation Distance (Feet): 60 Feet (x2) Assistive device: 1  person hand held assist (full wrap around support with gait belt) Gait Pattern/deviations: Step-to pattern;Decreased stride length;Decreased dorsiflexion - right;Decreased weight shift to left;Staggering right;Trunk flexed;Narrow base of support Gait velocity: decreased   General Gait Details: Patient responding well to faciliation techniques including exertnal auditory and tactile faciliatation. Patient continues to have difficulty advancing RLE, imporved with some faciliation of weight shift. patient with circumduction compensation but inability to clear RLE.    Stairs            Wheelchair Mobility    Modified Rankin (Stroke Patients Only) Modified Rankin (Stroke Patients Only) Pre-Morbid Rankin Score: No symptoms Modified Rankin: Moderately severe disability     Balance     Sitting balance-Leahy Scale: Fair     Standing balance support: During functional activity Standing balance-Leahy Scale: Poor Standing balance comment: Required support to stand at counter and perform functional tasks                    Cognition Arousal/Alertness: Awake/alert Behavior During Therapy: [facility] for tasks assessed/performed Overall Cognitive Status: Within Functional Limits for tasks assessed           Safety/Judgement: Decreased awareness of safety Awareness: Anticipatory Problem Solving: Requires verbal cues;Requires tactile cues General Comments: Improvements noted in cognition this session, increased ability to process commands consistently without delay. Patient with improved task attention and awareness this session. Continues to demostrate an overall inattention to right side but noted more voluntary active use of R side compared to previous session.    Exercises      General Comments        Pertinent Vitals/Pain Pain Assessment: No/denies pain  Home Living                      Prior Function            PT Goals (current goals can now be found  in the care plan section) Acute Rehab PT Goals Patient Stated Goal: to get better PT Goal Formulation: With patient/family Time For Goal Achievement: 01/19/15 Potential to Achieve Goals: Good Progress towards PT goals: Progressing toward goals    Frequency  Min 3X/week    PT Plan Current plan remains appropriate    Co-evaluation             End of Session Equipment Utilized During Treatment: Gait belt Activity Tolerance: Patient tolerated treatment well Patient left: in chair;with call bell/phone within reach;with family/visitor present (transport in room to take to MRI)     Time: 1204-1228 PT Time Calculation (min) (ACUTE ONLY): 24 min  Charges:  $Gait Training: 8-22 mins $Therapeutic Activity: 8-22 mins                    G CodesDuncan Dull 01/11/15, 5:29 PM Alben Deeds, [place] DPT  [phone]

## 2015-01-06 NOTE — Evaluation (Signed)
  Speech Language Pathology Evaluation Patient Details Name: Kristin Griffin MRN: [id number] DOB: [birth year] Today's Date: 01/06/2015 Time: 6812-7517 SLP Time Calculation (min) (ACUTE ONLY): 19 min  Problem List:  Patient Active Problem List   Diagnosis Date Noted  . Lung nodule 01/04/2015  . Hypokalemia 01/04/2015  . Protein-calorie malnutrition, severe 01/04/2015  . Right sided weakness 01/03/2015  . Brain mass 01/03/2015  . Osteoporosis 12/15/2013  . Hypertension    Past Medical History:  Past Medical History  Diagnosis Date  . Hypertension    Past Surgical History:  Past Surgical History  Procedure Laterality Date  . Abdominal hysterectomy     HPI:  64 year old female presents with a 2 month history of progressive right-sided weakness. 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   Assessment / Plan / Recommendation Clinical Impression  Cognitive linguistic evaluation complete including administration of the Cognistat examination of cognitive-linguistic function. Patient [facility]  for all areas assessed. No SLP needs indicated currently. Educated patient on availability of cognitive-linguistic therapy should needs arise in the future.     SLP Assessment  Patient does not need any further Speech Lanaguage Pathology Services    Follow Up Recommendations  None       Pertinent Vitals/Pain Pain Assessment: No/denies pain   SLP Goals     SLP Evaluation Prior Functioning  Cognitive/Linguistic Baseline: Within functional limits Type of Home: House Available Help at Discharge: Family   Cognition  Overall Cognitive Status: Within Functional Limits for tasks assessed Orientation Level: Oriented X4    Comprehension  Auditory Comprehension Overall Auditory  Comprehension: Appears within functional limits for tasks assessed Visual Recognition/Discrimination Discrimination: Within Function Limits Reading Comprehension Reading Status: Within funtional limits    Expression Expression Primary Mode of Expression: Verbal Verbal Expression Overall Verbal Expression: Appears within functional limits for tasks assessed Written Expression Dominant Hand: Right   Oral / Motor Oral Motor/Sensory Function Overall Oral Motor/Sensory Function: Appears within functional limits for tasks assessed Motor Speech Overall Motor Speech: Appears within functional limits for tasks assessed   GO   Gabriel Rainwater MA, CCC-SLP [phone]   Kristin Griffin 01/06/2015, 10:55 AM

## 2015-01-07 DIAGNOSIS — C7931 Secondary malignant neoplasm of brain: Secondary | ICD-10-CM

## 2015-01-07 LAB — CBC
HEMATOCRIT: 39.9 % (ref 36.0–46.0)
Hemoglobin: 13.2 g/dL (ref 12.0–15.0)
MCH: 31.4 pg (ref 26.0–34.0)
MCHC: 33.1 g/dL (ref 30.0–36.0)
MCV: 94.8 fL (ref 78.0–100.0)
PLATELETS: 373 10*3/uL (ref 150–400)
RBC: 4.21 MIL/uL (ref 3.87–5.11)
RDW: 14.2 % (ref 11.5–15.5)
WBC: 7.3 10*3/uL (ref 4.0–10.5)

## 2015-01-07 LAB — BASIC METABOLIC PANEL
ANION GAP: 9 (ref 5–15)
BUN: 19 mg/dL (ref 6–20)
CO2: 27 mmol/L (ref 22–32)
Calcium: 9.4 mg/dL (ref 8.9–10.3)
Chloride: 101 mmol/L (ref 101–111)
Creatinine, Ser: 0.8 mg/dL (ref 0.44–1.00)
GLUCOSE: 124 mg/dL — AB (ref 65–99)
POTASSIUM: 4.3 mmol/L (ref 3.5–5.1)
Sodium: 137 mmol/L (ref 135–145)

## 2015-01-07 LAB — GLUCOSE, CAPILLARY
GLUCOSE-CAPILLARY: 126 mg/dL — AB (ref 65–99)
GLUCOSE-CAPILLARY: 230 mg/dL — AB (ref 65–99)
GLUCOSE-CAPILLARY: 112 mg/dL — AB (ref 65–99)
Glucose-Capillary: 123 mg/dL — ABNORMAL HIGH (ref 65–99)

## 2015-01-07 NOTE — Consult Note (Signed)
Radiation Oncology         [phone] ________________________________  Name: Kristin Griffin MRN: [id number]  Date: 01/03/2015  DOB: 28-Feb-1951  PJ:KDTO,IZT, MD  No ref. provider found     REFERRING PHYSICIAN: No ref. provider found   DIAGNOSIS: The primary encounter diagnosis was Brain mass. Diagnoses of Lung nodule, Right lower lobe lung mass, Malignancy, Mass, Metastasis, Lung mass, and Lytic bone lesions on xray were also pertinent to this visit.   HISTORY OF PRESENT ILLNESS::Kristin Griffin is a 64 y.o. female who is seen for an initial consultation visit regarding the patient's diagnosis of brain metastasis.  The patient presented with a 2 month history of right-sided weakness.   No prior history of cancer. Workup inculding MRI scan has shown 2 brain tumors consistent with metastasis. Systemic workup shows metastatic disease include likely multifocal pulmonary metastasis/ primary tumor as well as bony metastasis.  The patient states she has improved after beginning steroids. She denies left hip pain. Does have some pain in the lower right ribcage laterally.   PREVIOUS RADIATION THERAPY: No   PAST MEDICAL HISTORY:  has a past medical history of Hypertension.     PAST SURGICAL HISTORY: Past Surgical History  Procedure Laterality Date  . Abdominal hysterectomy       FAMILY HISTORY: family history is not on file.   SOCIAL HISTORY:  reports that she quit smoking about 14 months ago. She does not have any smokeless tobacco history on file. She reports that she drinks alcohol. She reports that she does not use illicit drugs.   ALLERGIES: Review of patient's allergies indicates no known allergies.   MEDICATIONS:  Current Facility-Administered Medications  Medication Dose Route Frequency Provider Last Rate Last Dose  . 0.9 %  sodium chloride infusion  250 mL Intravenous PRN Theressa Millard, MD      . acetaminophen (TYLENOL) tablet 650 mg  650 mg Oral Q6H PRN  Theressa Millard, MD   650 mg at 01/06/15 2458   Or  . acetaminophen (TYLENOL) suppository 650 mg  650 mg Rectal Q6H PRN Theressa Millard, MD      . alum & mag hydroxide-simeth (MAALOX/MYLANTA) 200-200-20 MG/5ML suspension 30 mL  30 mL Oral Q6H PRN Theressa Millard, MD      . calcium carbonate (OS-CAL - dosed in mg of elemental calcium) tablet 500 mg of elemental calcium  1 tablet Oral Q breakfast Theressa Millard, MD   500 mg of elemental calcium at 01/07/15 0911  . dexamethasone (DECADRON) injection 4 mg  4 mg Intravenous 4 times per day Ripudeep Krystal Eaton, MD   4 mg at 01/07/15 0521  . feeding supplement (ENSURE ENLIVE) (ENSURE ENLIVE) liquid 237 mL  237 mL Oral BID BM Harvette C Arnoldo Morale, MD   237 mL at 01/06/15 1426  . heparin injection 5,000 Units  5,000 Units Subcutaneous 3 times per day Ripudeep Krystal Eaton, MD   5,000 Units at 01/07/15 0524  . HYDROmorphone (DILAUDID) injection 0.5-1 mg  0.5-1 mg Intravenous Q3H PRN Theressa Millard, MD      . insulin aspart (novoLOG) injection 0-15 Units  0-15 Units Subcutaneous TID WC Hosie Poisson, MD   2 Units at 01/07/15 0908  . insulin aspart (novoLOG) injection 3 Units  3 Units Subcutaneous TID WC Ripudeep Krystal Eaton, MD   3 Units at 01/06/15 1800  . levofloxacin (LEVAQUIN) tablet 750 mg  750 mg Oral Daily Terrace Arabia, RPH  750 mg at 01/07/15 1131  . multivitamin with minerals tablet 1 tablet  1 tablet Oral Daily Theressa Millard, MD   1 tablet at 01/07/15 0911  . omega-3 acid ethyl esters (LOVAZA) capsule 1 g  1 g Oral Daily Theressa Millard, MD   1 g at 01/07/15 0911  . ondansetron (ZOFRAN) tablet 4 mg  4 mg Oral Q6H PRN Theressa Millard, MD       Or  . ondansetron (ZOFRAN) injection 4 mg  4 mg Intravenous Q6H PRN Theressa Millard, MD      . oxyCODONE (Oxy IR/ROXICODONE) immediate release tablet 5 mg  5 mg Oral Q4H PRN Theressa Millard, MD   5 mg at 01/07/15 0713  . pantoprazole (PROTONIX) EC tablet 40 mg  40 mg Oral QHS Hosie Poisson,  MD   40 mg at 01/06/15 2150  . sodium chloride 0.9 % injection 3 mL  3 mL Intravenous Q12H Theressa Millard, MD   3 mL at 01/07/15 0912  . sodium chloride 0.9 % injection 3 mL  3 mL Intravenous PRN Theressa Millard, MD      . verapamil (CALAN-SR) CR tablet 240 mg  240 mg Oral QHS Theressa Millard, MD   240 mg at 01/06/15 2150     REVIEW OF SYSTEMS:  A 15 point review of systems is documented in the electronic medical record. This was obtained by the nursing staff. However, I reviewed this with the patient to discuss relevant findings and make appropriate changes.  Pertinent items are noted in HPI.    PHYSICAL EXAM:  height is '5\' 6"'  (1.676 m) and weight is 105 lb 2.6 oz (47.7 kg). Her oral temperature is 97.9 F (36.6 C). Her blood pressure is 150/76 and her pulse is 82. Her respiration is 20 and oxygen saturation is 94%.   ECOG = 1-2  0 - Asymptomatic (Fully active, able to carry on all predisease activities without restriction)  1 - Symptomatic but completely ambulatory (Restricted in physically strenuous activity but ambulatory and able to carry out work of a light or sedentary nature. For example, light housework, office work)  2 - Symptomatic, <50% in bed during the day (Ambulatory and capable of all self care but unable to carry out any work activities. Up and about more than 50% of waking hours)  3 - Symptomatic, >50% in bed, but not bedbound (Capable of only limited self-care, confined to bed or chair 50% or more of waking hours)  4 - Bedbound (Completely disabled. Cannot carry on any self-care. Totally confined to bed or chair)  5 - Death   Eustace Pen MM, Creech RH, Tormey DC, et al. [phone]). "Toxicity and response criteria of the [facility]". [place] Oncol. 5 (6): 649-55     LABORATORY DATA:  Lab Results  Component Value Date   WBC 7.3 01/07/2015   HGB 13.2 01/07/2015   HCT 39.9 01/07/2015   MCV 94.8 01/07/2015   PLT 373 01/07/2015   Lab  Results  Component Value Date   NA 137 01/07/2015   K 4.3 01/07/2015   CL 101 01/07/2015   CO2 27 01/07/2015   Lab Results  Component Value Date   ALT 13* 01/03/2015   AST 23 01/03/2015   ALKPHOS 95 01/03/2015   BILITOT 0.7 01/03/2015      RADIOGRAPHY: Dg Chest 2 View  01/03/2015   CLINICAL DATA:  Initial evaluation for acute right-sided weakness.  EXAM: CHEST  2 VIEW  COMPARISON:  None.  FINDINGS: Transverse heart size at the upper limits of normal. Mediastinal silhouette within normal limits. Atheromatous plaque present within the aortic arch.  Lungs are mildly hyperinflated with attenuation of the pulmonary markings, consistent with emphysema. Minimal left basilar atelectasis/scarring. Similarly, probable parenchymal scarring within the peripheral right lower lobe. No focal infiltrate, pulmonary edema, or pleural effusion. No pneumothorax. There is a 15 mm nodular and somewhat lobulated density overlying the right upper lobe, indeterminate.  Osteopenia noted.  No acute osseus abnormality.  IMPRESSION: 1. Emphysema.  No superimposed active cardiopulmonary disease. 2. 14 mm nodular density within the right upper lobe. Follow-up examination with cross-sectional imaging of the chest is recommended for further evaluation.   Electronically Signed   By: Jeannine Boga M.D.   On: 01/03/2015 21:39   Ct Head Wo Contrast  01/03/2015   CLINICAL DATA:  Right-sided hemiparesis.  EXAM: CT HEAD WITHOUT CONTRAST  TECHNIQUE: Contiguous axial images were obtained from the base of the skull through the vertex without intravenous contrast.  COMPARISON:  None.  FINDINGS: 2.5 x 2.1 cm mass is noted in the left centrum semiovale with surrounding white matter edema. Also noted is 2.3 x 1.4 cm mass in right cerebellar hemisphere with surrounding white matter edema. These findings most consistent with metastatic disease. Ventricular size is within normal limits. No significant midline shift is seen at this time.  No definite hemorrhage is noted. Multiple small lucencies are noted in the bony calvarium which potentially may represent metastatic disease.  IMPRESSION: Masses with surrounding white matter edema are noted in the left parietal lobe and right cerebellar hemisphere most consistent with metastatic disease. Also noted are multiple small lucencies in the bony calvarium which may represent metastatic disease. MRI with and without gadolinium is recommended for further evaluation.   Electronically Signed   By: Marijo Conception, M.D.   On: 01/03/2015 19:40   Ct Chest W Contrast  01/04/2015   ADDENDUM REPORT: 01/04/2015 10:31  ADDENDUM: There is atherosclerotic change in aorta but no aneurysm. No thoracic aortic aneurysm or dissection. No pulmonary embolus appreciable. There are scattered foci of coronary artery calcification.   Electronically Signed   By: Lowella Grip III M.D.   On: 01/04/2015 10:31   01/04/2015   CLINICAL DATA:  Pulmonary nodular lesion on chest radiograph  EXAM: CT CHEST WITH CONTRAST  TECHNIQUE: Multidetector CT imaging of the chest was performed during intravenous contrast administration.  CONTRAST:  47m OMNIPAQUE IOHEXOL 300 MG/ML  SOLN  COMPARISON:  Chest radiograph January 03, 2015  FINDINGS: There is underlying centrilobular emphysematous change. There is an irregular opacity with mild adjacent peribronchial thickening in the right apex measuring 1.4 x 0.6 cm, best seen on axial slice 9 series 2518 There is a lobular appearing nodular lesion in the posterior segment of the right upper lobe measuring 1.7 x 1.3 cm, best seen on axial slice 20 series 2841 There is a lobular appearing nodule also in the posterior segment of the right upper lobe measuring 1.2 x 1.2 cm, best seen on axial slice 26 series 2660 There is a nodular lesion abutting the minor fissure in the posterior segment left upper lobe on slice 30 series 2630measuring 0.9 x 0.9 cm. There is a nodular lesion in the superior  segment of the right lower lobe seen on axial slice 36 series 2160measuring 1.2 x 1.2 cm. There is an irregular nodular lesion in the superior segment  of the right lower lobe measuring 1.2 x 1.1 cm on axial slice 38 series 366. There is an irregular spiculated lesion in the lateral segment of the right middle lobe measuring 2.8 x 2.3 cm. This lesion is best seen on axial slice 40 series 440. There is an area of consolidation in the posterior segment of the right lower lobe. There are scattered areas of presumed scarring in the lungs bilaterally.  There is a nodular lesion in the right lobe of the thyroid measuring 1.7 x 0.7 cm. There is a nodular lesion arising from the isthmus of the thyroid extending toward the left measuring 2.5 x 1.0 cm.  There are prominent right hilar lymph nodes. The largest individual lymph node measures 1.7 x 1.1 cm. A second right hilar lymph node measures 1.3 x 1.2 cm. No other adenopathy is appreciable.  There is left ventricular hypertrophy. There is calcification in multiple coronary artery regions. Pericardium is not thickened.  Visualized upper abdomen, the adrenals appear unremarkable. There is atherosclerotic change in aorta. There is equivocal hepatic steatosis.  There are no blastic or lytic bone lesions. There is degenerative change in the thoracic spine with areas of endplate concavity at several levels, likely due to underlying osteoporosis.  IMPRESSION: Underlying emphysema. Multiple pulmonary nodular lesions, largest in the right middle lobe measuring 2.8 x 2.3 cm. Multifocal neoplasm is of concern. Advise PET-CT to further evaluate.  Consolidation right base posteriorly. This appearance by CT is more consistent with pneumonia than neoplasm, although a followup study in approximately 4 weeks to further evaluate this area may well be warranted.  Right hilar adenopathy.  Thyroid nodular lesions as noted above. Advise thyroid ultrasound to further evaluate.  These results will  be called to the ordering clinician or representative by the Radiologist Assistant, and communication documented in the PACS or zVision Dashboard.  Electronically Signed: By: Lowella Grip III M.D. On: 01/04/2015 10:26   Mr Jeri Cos HK Contrast  01/05/2015   CLINICAL DATA:  Right-sided hemi paresis. Unintentional weight loss. Brain masses on CT. Evidence of metastatic disease.  EXAM: MRI HEAD WITHOUT AND WITH CONTRAST  TECHNIQUE: Multiplanar, multiecho pulse sequences of the brain and surrounding structures were obtained without and with intravenous contrast.  CONTRAST:  37m MULTIHANCE GADOBENATE DIMEGLUMINE 529 MG/ML IV SOLN  COMPARISON:  Head CT 01/03/2015  FINDINGS: There is no evidence of acute infarct, midline shift, or extra-axial fluid collection. There is mild to moderate generalized cerebral atrophy. Patchy periventricular and subcortical white matter T2 hyperintensities are nonspecific but compatible with mild-to-moderate chronic small vessel ischemic disease.  Brain masses described on the recent CT demonstrate solid enhancement and measure 3.4 x 2.5 cm in the inferior right cerebellar hemisphere and 2.8 x 2.1 cm in the posterior left frontal lobe, each with mild surrounding vasogenic edema. Both lesions demonstrate areas of susceptibility artifact consistent with chronic blood products. A separate focus of chronic microhemorrhage is noted slightly more anteriorly in the right cerebellum without associated enhancement. There is a prominent vessel which courses into the superior aspect of the left frontal lesion. No other enhancing brain lesions are identified.  The bone marrow signal of the skull is diffusely heterogeneous on precontrast T1 weighted images and demonstrates diffusely heterogeneous enhancement after contrast administration. No destructive skull lesion is identified, however there are scattered subcentimeter foci of discrete enhancement. No dural-based mass is seen, although the right  cerebellar mass does extend towards the dural surface.  Orbits are unremarkable. Paranasal sinuses and  mastoid air cells are clear. Major intracranial vascular flow voids are preserved.  IMPRESSION: 1. Two enhancing brain masses, consistent with metastases. Mild surrounding edema without significant mass effect. 2. Diffusely heterogeneous bone marrow in the skull, nonspecific however metastatic disease is a consideration.   Electronically Signed   By: Logan Bores M.D.   On: 01/05/2015 13:37   Nm Bone Scan Whole Body  01/05/2015   CLINICAL DATA:  Brain metastases, as well as lucent lesions in the calvarium concerning for bone mets  EXAM: NUCLEAR MEDICINE WHOLE BODY BONE SCAN  TECHNIQUE: Whole body anterior and posterior images were obtained approximately 3 hours after intravenous injection of radiopharmaceutical.  RADIOPHARMACEUTICALS:  25 mCi Technetium-100mMDP IV  COMPARISON:  Multiple studies performed september 2016.  FINDINGS: Multiple foci of increased uptake involving the bilateral ribs. These appear to correspond to numerous rib fractures visible on the 01/04/15 CT scan. Lesion left femoral neck and trochanters consistent with fracture seen on same CT scan. Focus of uptake distal left forearm. Minimally heterogenous uptake in the calvarium without definite focal metastatic lesion there.  Degenerative uptake in the knees, right worse than left. Degenerative uptake in the ankles and probably degenerative uptake in the bilateral hindfeet.Degenerative uptake bilateral AC joints.  IMPRESSION: Lesions bilateral ribs and left femur consistent with fractures visible on recent CT scan.  Uptake distal left forearm:  Forearm radiographs recommended.  Minimally heterogenous uptake in the calvarium without definitive metastasis in this area.   Electronically Signed   By: RSkipper ClicheM.D.   On: 01/05/2015 17:00   Ct Abdomen Pelvis W Contrast  01/05/2015   CLINICAL DATA:  64year old female with concern for  malignancy  EXAM: CT ABDOMEN AND PELVIS WITH CONTRAST  TECHNIQUE: Multidetector CT imaging of the abdomen and pelvis was performed using the standard protocol following bolus administration of intravenous contrast.  CONTRAST:  816mOMNIPAQUE IOHEXOL 300 MG/ML  SOLN  COMPARISON:  Chest CT dated 01/04/2015  FINDINGS: Evaluation is limited due to respiratory motion artifact.  The 4.6 x 2.8 cm pleural based masslike opacity is noted at the right lung base posteriorly.  No intra-abdominal free air or free fluid identified.  The liver appears unremarkable. There are multiple stones within the gallbladder. No pericholecystic fluid or evidence of gallbladder inflammation. The pancreas appears unremarkable. There is slight prominence of the head of the pancreas with extension into the duodenal C-loop. No discrete lesion identified. There is no atrophy of the gland or duct dilatation. The spleen appears unremarkable. A splenule is noted. There is apparent thickening of the left adrenal gland on the coronal view 58 be related to an underlying is normal. The right adrenal gland is unremarkable.  There is lobulated appearance of the renal cortices bilaterally. There is a 2.4 x 2.1 cm hypoenhancing lesion in the superior pole of the left kidney. Ultrasound is recommended for further characterization. Subcentimeter scattered bilateral renal hypodense lesions are too small to characterize. There is no hydronephrosis on either side. The visualized ureters and urinary bladder appear unremarkable. Hysterectomy.  There is sigmoid diverticulosis with muscular hypertrophy. No active inflammation. Moderate stool noted throughout the colon. There no evidence of bowel obstruction or inflammation.  Advanced aortoiliac atherosclerotic disease. The origins of the celiac axis, SMA, IMA as well as the origins of the renal arteries are patent. No portal venous gas identified. There is no lymphadenopathy.  Osteopenia with degenerative changes of  the spine. There is compression deformity of the superior endplate of the T1V94  vertebra, age indeterminate, likely chronic. Clinical correlation is recommended. There is fracture of the greater trochanter of the left femur fossa on the prior CT dated 10/23/2014. Old left posterior rib fractures noted. No new fracture identified. Small scattered lucencies throughout the lumbar spine may be related to osteopenia or malignancy such as multiple myeloma or metastatic disease.  IMPRESSION: Partially visualized right lung base subpleural consolidation/mass as seen on the prior chest CT.  Left renal upper pole hypodense lesion. Ultrasound is recommended for further initial evaluation. MRI may be related for additional characterization depending on the ultrasound findings.  Cholelithiasis.  Sigmoid diverticulosis. No evidence of bowel obstruction or inflammation.  Osteopenia with fracture of the greater trochanter of the left femur as well as old left posterior rib fractures. Small lucencies throughout the lumbar vertebra may be related to osteopenia or represent multiple myeloma/ metastatic disease. Bone scan may provide better evaluation if clinically indicated.   Electronically Signed   By: Anner Crete M.D.   On: 01/05/2015 03:46   US Renal  01/05/2015   CLINICAL DATA:  Hypertension.  Indeterminate left renal mass on CT.  EXAM: RENAL / URINARY TRACT ULTRASOUND COMPLETE  COMPARISON:  CT of 01/04/2015.  FINDINGS: Right Kidney:  Length: 9.7 cm. No hydronephrosis. Normal renal cortical thickness and echogenicity.  Left Kidney:  Length: 9.8 cm. No hydronephrosis. Normal renal cortical thickness and echogenicity. Tiny left renal cysts identified. The upper pole dominant left renal lesion described on prior CT is not readily identified or well evaluated.  Bladder:  Appears normal for degree of bladder distention.  IMPRESSION: 1. The upper pole left renal indeterminate lesion on CT is not well evaluated or visualized.  Recommend nonemergent outpatient pre and post contrast abdominal MRI. 2.  No acute findings.   Electronically Signed   By: Abigail Miyamoto M.D.   On: 01/05/2015 20:42       IMPRESSION:  The patient has apparent brain metastasis, likely lung origin based on initial workup. Bronch/ biopsy scheduled for today.  Dr. Trenton Gammon has seen the patient and is not recommending cranial surgery at this time. Improving thusfar on steroids.  PLAN:  -continue decadron -biopsy today -non-emergent PET scan -outpatient follow-up with Dr. Julien Nordmann -patient will likely benefit from radiation treatment to brain mets (radiosurgery), possible XRT to other sites as well depending on continued pain. Patient will be discussed at the upcoming brain tumor conference on 9/19 to coordinate management of brain mets.    ________________________________   Jodelle Gross, MD, PhD   **Disclaimer: This note was dictated with voice recognition software. Similar sounding words can inadvertently be transcribed and this note may contain transcription errors which may not have been corrected upon publication of note.**

## 2015-01-07 NOTE — Progress Notes (Signed)
Physical Therapy Treatment Patient Details Name: Kristin Griffin MRN: [id number] DOB: [birth year] Today's Date: 01/07/2015    History of Present Illness 64 y.o. female presents with complaints of Right sided Weakness and progressive decline for the past 3 weeks.  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    PT Comments    Pt continues to make good progress.  Follow Up Recommendations  CIR;Supervision/Assistance - 24 hour     Equipment Recommendations  Other (comment) (TBD)    Recommendations for Other Services Rehab consult     Precautions / Restrictions Precautions Precautions: Fall Restrictions Weight Bearing Restrictions: No    Mobility  Bed Mobility Overal bed mobility: Needs Assistance Bed Mobility: Supine to Sit     Supine to sit: Min guard     General bed mobility comments: Verbal cues for bringing attention to rt side  Transfers Overall transfer level: Needs assistance Equipment used: 1 person hand held assist;Rolling walker (2 wheeled) Transfers: Sit to/from Stand Sit to Stand: Min assist         General transfer comment: Assist for balance and verbal cues for attention to rt side.  Ambulation/Gait Ambulation/Gait assistance: Min assist;Mod assist Ambulation Distance (Feet): 150 Feet (150' with HHA; 77' with walker) Assistive device: 1 person hand held assist;Rolling walker (2 wheeled) (full wrap around support with gait belt) Gait Pattern/deviations: Step-through pattern;Step-to pattern;Decreased step length - right;Decreased dorsiflexion - right;Decreased weight shift to left Gait velocity: decreased Gait velocity interpretation: Below normal speed for age/gender General Gait Details: Initially pt advancing RLE with hand held assist  and tactile facilitation to wt shift to left. As distance incr pt with more difficulty advancing RLE and verbal cues to make sure rt foot comes through. With use of walker pt required mod A and with more difficulty advancing RLE. Decr attention to RUE on walker.   Stairs            Wheelchair Mobility    Modified Rankin (Stroke Patients Only) Modified Rankin (Stroke Patients Only) Pre-Morbid Rankin Score: No symptoms Modified Rankin: Moderately severe disability     Balance Overall balance assessment: Needs assistance Sitting-balance support: No upper extremity supported;Feet supported Sitting balance-Leahy Scale: Fair     Standing balance support: Single extremity supported Standing balance-Leahy Scale: Poor Standing balance comment: Upper extremity support and min A.                    Cognition Arousal/Alertness: Awake/alert Behavior During Therapy: WFL for tasks assessed/performed Overall Cognitive Status: Within Functional Limits for tasks assessed             Awareness: Anticipatory Problem Solving: Requires verbal cues;Requires tactile cues General Comments: Continues to show improvements.    Exercises      General Comments        Pertinent Vitals/Pain Pain Assessment: No/denies pain    Home Living                      Prior Function            PT Goals (current goals can now be found in the care plan section) Acute Rehab PT Goals PT Goal Formulation: With patient/family Time For Goal Achievement: 01/19/15 Potential to Achieve Goals: Good Progress towards PT  goals: Progressing toward goals    Frequency  Min 3X/week    PT Plan Current plan remains appropriate    Co-evaluation             End of Session Equipment Utilized During Treatment: Gait belt Activity Tolerance: Patient tolerated treatment well Patient left: in chair;with call bell/phone within reach;with family/visitor present;with chair alarm set  (transport in room to take to MRI)     Time: 0932-1000 PT Time Calculation (min) (ACUTE ONLY): 28 min  Charges:  $Gait Training: 23-37 mins                    G Codes:      Concettina Leth 02-05-2015, 11:25 AM  Suanne Marker PT [phone]

## 2015-01-07 NOTE — Clinical Social Work Note (Signed)
Clinical Social Work Assessment  Patient Details  Name: Kristin Griffin MRN: [id number] Date of Birth: 17-Feb-1951  Date of referral:  01/07/15               Reason for consult:  Facility Placement, Discharge Planning                Permission sought to share information with:  Facility Sport and exercise psychologist, Family Supports Permission granted to share information::  Yes, Verbal Permission Granted  Name::     Patient's son  Agency::  [facility]  Relationship::     Contact Information:     Housing/Transportation Living arrangements for the past 2 months:  Single Family Home Source of Information:  Patient Patient Interpreter Needed:  None Criminal Activity/Legal Involvement Pertinent to Current Situation/Hospitalization:  No - Comment as needed Significant Relationships:  Adult Children, Siblings, Friend Lives with:  Self Do you feel safe going back to the place where you live?  Yes Need for family participation in patient care:  No (Coment)  Care giving concerns:  Patient and patient's son both express concern about the patient returning home at discharge as they do not feel this is safe.   Social Worker assessment / plan:  CSW met with patient and patient's son at bedside to complete assessment. Patient sitting in recliner and appears comfortable. Patient was admitted from home where she lives alone. Both patient and son state that they are interested in looking at SNF and CIR as options for patient's disposition. CSW senses that the patient and family would prefer CIR more than SNF. CSW did explain to patient and family that many things would need to fall into place for a CIR admission. CSW explained SNF search/placement process and answered the patient's questions. Patient is familiar with the process as she has been through it before with her husband. Patient reports that she did recently lose her husband who passed away at [facility] a couple of weeks ago.   Employment status:   Disabled (Comment on whether or not currently receiving Disability) Insurance information:  Programmer, applications PT Recommendations:  Inpatient Rehab Consult Information / Referral to community resources:  [place]  Patient/Family's Response to care:  Patient appears to be somewhat overwhelmed by everything that is going on right now, she states, "I'm seeing so many doctors and so much is going on, I'm supposed to have a procedure this afternoon."  Patient/Family's Understanding of and Emotional Response to Diagnosis, Current Treatment, and Prognosis:  Patient appears to be down. She states that she has been dealing with a lot with losing her husband and her own health issues. Patient reports having a good support system. Patient appears to have good understanding of reason for admission and her post DC needs.  Emotional Assessment Appearance:  Appears older than stated age Attitude/Demeanor/Rapport:  Other (Appropriate/welcoming) Affect (typically observed):  Accepting, Calm, Appropriate, Pleasant Orientation:  Oriented to Self, Oriented to Place, Oriented to  Time, Oriented to Situation Alcohol / Substance use:  Tobacco Use Psych involvement (Current and /or in the community):  No (Comment)  Discharge Needs  Concerns to be addressed:  Discharge Planning Concerns, Adjustment to Illness, Grief and Loss Concerns, Coping/Stress Concerns Readmission within the last 30 days:  No Current discharge risk:  Chronically ill, Physical Impairment Barriers to Discharge:  Continued Medical Work up   [employer] MSW, [place], [place], [phone]

## 2015-01-07 NOTE — Progress Notes (Signed)
[facility]                                                                              Patient Demographics  Kristin Griffin, is a 64 y.o. female, DOB - [birth year], ELF:[id number]  Admit date - 01/03/2015   Admitting Physician Theressa Millard, MD  Outpatient Primary MD for the patient is Delman Cheadle, MD  LOS - 4   Chief Complaint  Patient presents with  . Weakness       Brief HPI   Kristin Griffin is a 64 y.o. female with a history of HTN who presents to the ED with complaints of Right sided Weakness and progressive decline for the past 3 weeks.The patient had presented with unintentional weight loss of 10 pounds in the past 5 weeks, no fevers or chills. Patient's brothers notice that she was walking and dragging her right leg and brought her to the ED. CT head showed 2.3 into 1.4 cm mass in the right cerebellar hemisphere, 2.5 and 2.1 cm mass in the left centrum semiovale with white matter edema. It was followed by a CT chest with contrast to evaluate for a primary. CT chest showed multiple pulmonary nodules. Patient was referred to admission.  Assessment & Plan    Principal Problem:   Brain mass, right lung mass, bony lucencies in the lumbar spine, renal lesion, underlying smoking history one pack per day for 45-50 years - CT head showed 2.3 into 1.4 cm mass in the right cerebellar hemisphere, 2.5 and 2.1 cm mass in the left centrum semiovale with white matter edema.  - Patient started on IV Decadron - Neurosurgery, Onc consulted. Rad Onc consulted - pulmonology consulted, plan for EBUS and lung biopsy today  Right lung mass - Patient has multiple lung masses, including spiculated lesion in the right middle lobe 2.8X2.3 centimeter, underlying emphysema, multifocal neoplasm also of concern - pulmonology consulted, plan for EBUS and lung biopsy today - Onc consulted, Dr Earlie Server following  - Patient was placed on levofloxacin for possible postobstructive  pneumonia as per CT chest  Left renal upper pole lesion, lumbar vertebra lesions - Renal ultrasound upper left renal lesion not visualized, recommend nonemergent MRI   Protein-calorie malnutrition, severe with underlying possible malignancy - Nutrition consult  Right-sided weakness likely due to the brain masses and edema - Continue physical therapy, IV Decadron  Bone lesions: Bone scan: Lesions bilateral ribs and left femur consistent with fractures visible on recent CT scan. No mets   Hypertension Currently stable, continue verapamil  Code Status: Full code  Family Communication: Discussed in detail with the patient, all imaging results, lab results explained to the patient and son at the bedside.   Disposition Plan: Not medically ready  Time Spent in minutes   37mnutes  Procedures  CT head CT chest, CT abdomen and pelvis  Consults   Pulm IR Neurosurgery oncology   DVT Prophylaxis  heparin   Medications  Scheduled Meds: . calcium carbonate  1 tablet Oral Q breakfast  . dexamethasone  4 mg Intravenous 4 times per day  . feeding supplement (ENSURE ENLIVE)  237 mL Oral BID BM  .  heparin subcutaneous  5,000 Units Subcutaneous 3 times per day  . insulin aspart  0-15 Units Subcutaneous TID WC  . insulin aspart  3 Units Subcutaneous TID WC  . levofloxacin  750 mg Oral Daily  . multivitamin with minerals  1 tablet Oral Daily  . omega-3 acid ethyl esters  1 g Oral Daily  . pantoprazole  40 mg Oral QHS  . sodium chloride  3 mL Intravenous Q12H  . verapamil  240 mg Oral QHS   Continuous Infusions:  PRN Meds:.sodium chloride, acetaminophen **OR** acetaminophen, alum & mag hydroxide-simeth, HYDROmorphone (DILAUDID) injection, ondansetron **OR** ondansetron (ZOFRAN) IV, oxyCODONE, sodium chloride   Antibiotics   Anti-infectives    Start     Dose/Rate Route Frequency Ordered Stop   01/05/15 1100  levofloxacin (LEVAQUIN) tablet 750 mg     750 mg Oral Daily 01/05/15  1028     01/04/15 1200  levofloxacin (LEVAQUIN) IVPB 750 mg  Status:  Discontinued     750 mg 100 mL/hr over 90 Minutes Intravenous Every 24 hours 01/04/15 1111 01/05/15 1028        Subjective:   Kristin Griffin was seen and examined today. Feeling much better, per son at the bedside, she ambulating to the bathroom. Patient denies dizziness, chest pain, shortness of breath, abdominal pain, N/V/D/C. afebrile.  Objective:   Blood pressure 150/76, pulse 82, temperature 97.9 F (36.6 C), temperature source Oral, resp. rate 20, height _0  (1.676 m), weight 47.7 kg (105 lb 2.6 oz), SpO2 94 %.  Wt Readings from Last 3 Encounters:  01/03/15 47.7 kg (105 lb 2.6 oz)  02/03/14 51.166 kg (112 lb 12.8 oz)  11/21/13 51.256 kg (113 lb)     Intake/Output Summary (Last 24 hours) at 01/07/15 1405 Last data filed at 01/07/15 1300  Gross per 24 hour  Intake      0 ml  Output    650 ml  Net   -650 ml    Exam  General: Alert and oriented x 3, NAD  HEENT:  PERRLA, EOMI, Anicteric Sclera, mucous membranes moist.   Neck: Supple, no JVD, no masses  CVS: S1 S2 clear, RRR  Respiratory: CTAB  Abdomen: Soft, nontender, nondistended, + bowel sounds  Ext: no cyanosis clubbing or edema  Neuro: no new weakness  Skin: No rashes  Psych: Normal affect and demeanor, alert and oriented x3    Data Review   Micro Results Recent Results (from the past 240 hour(s))  Urine culture     Status: None   Collection Time: 01/03/15  8:10 PM  Result Value Ref Range Status   Specimen Description URINE, CLEAN CATCH  Final   Special Requests NONE  Final   Culture MULTIPLE SPECIES PRESENT, SUGGEST RECOLLECTION  Final   Report Status 01/05/2015 FINAL  Final    Radiology Reports Dg Chest 2 View  01/03/2015   CLINICAL DATA:  Initial evaluation for acute right-sided weakness.  EXAM: CHEST  2 VIEW  COMPARISON:  None.  FINDINGS: Transverse heart size at the upper limits of normal. Mediastinal silhouette  within normal limits. Atheromatous plaque present within the aortic arch.  Lungs are mildly hyperinflated with attenuation of the pulmonary markings, consistent with emphysema. Minimal left basilar atelectasis/scarring. Similarly, probable parenchymal scarring within the peripheral right lower lobe. No focal infiltrate, pulmonary edema, or pleural effusion. No pneumothorax. There is a 15 mm nodular and somewhat lobulated density overlying the right upper lobe, indeterminate.  Osteopenia noted.  No acute osseus abnormality.  IMPRESSION: 1. Emphysema.  No superimposed active cardiopulmonary disease. 2. 14 mm nodular density within the right upper lobe. Follow-up examination with cross-sectional imaging of the chest is recommended for further evaluation.   Electronically Signed   By: Jeannine Boga M.D.   On: 01/03/2015 21:39   Ct Head Wo Contrast  01/03/2015   CLINICAL DATA:  Right-sided hemiparesis.  EXAM: CT HEAD WITHOUT CONTRAST  TECHNIQUE: Contiguous axial images were obtained from the base of the skull through the vertex without intravenous contrast.  COMPARISON:  None.  FINDINGS: 2.5 x 2.1 cm mass is noted in the left centrum semiovale with surrounding white matter edema. Also noted is 2.3 x 1.4 cm mass in right cerebellar hemisphere with surrounding white matter edema. These findings most consistent with metastatic disease. Ventricular size is within normal limits. No significant midline shift is seen at this time. No definite hemorrhage is noted. Multiple small lucencies are noted in the bony calvarium which potentially may represent metastatic disease.  IMPRESSION: Masses with surrounding white matter edema are noted in the left parietal lobe and right cerebellar hemisphere most consistent with metastatic disease. Also noted are multiple small lucencies in the bony calvarium which may represent metastatic disease. MRI with and without gadolinium is recommended for further evaluation.   Electronically  Signed   By: Marijo Conception, M.D.   On: 01/03/2015 19:40   Ct Chest W Contrast  01/04/2015   ADDENDUM REPORT: 01/04/2015 10:31  ADDENDUM: There is atherosclerotic change in aorta but no aneurysm. No thoracic aortic aneurysm or dissection. No pulmonary embolus appreciable. There are scattered foci of coronary artery calcification.   Electronically Signed   By: Lowella Grip III M.D.   On: 01/04/2015 10:31   01/04/2015   CLINICAL DATA:  Pulmonary nodular lesion on chest radiograph  EXAM: CT CHEST WITH CONTRAST  TECHNIQUE: Multidetector CT imaging of the chest was performed during intravenous contrast administration.  CONTRAST:  13m OMNIPAQUE IOHEXOL 300 MG/ML  SOLN  COMPARISON:  Chest radiograph January 03, 2015  FINDINGS: There is underlying centrilobular emphysematous change. There is an irregular opacity with mild adjacent peribronchial thickening in the right apex measuring 1.4 x 0.6 cm, best seen on axial slice 9 series 2528 There is a lobular appearing nodular lesion in the posterior segment of the right upper lobe measuring 1.7 x 1.3 cm, best seen on axial slice 20 series 2413 There is a lobular appearing nodule also in the posterior segment of the right upper lobe measuring 1.2 x 1.2 cm, best seen on axial slice 26 series 2244 There is a nodular lesion abutting the minor fissure in the posterior segment left upper lobe on slice 30 series 2010measuring 0.9 x 0.9 cm. There is a nodular lesion in the superior segment of the right lower lobe seen on axial slice 36 series 2272measuring 1.2 x 1.2 cm. There is an irregular nodular lesion in the superior segment of the right lower lobe measuring 1.2 x 1.1 cm on axial slice 38 series 2536 There is an irregular spiculated lesion in the lateral segment of the right middle lobe measuring 2.8 x 2.3 cm. This lesion is best seen on axial slice 40 series 2644 There is an area of consolidation in the posterior segment of the right lower lobe. There are scattered  areas of presumed scarring in the lungs bilaterally.  There is a nodular lesion in the right lobe of the thyroid measuring 1.7 x 0.7 cm. There is a nodular  lesion arising from the isthmus of the thyroid extending toward the left measuring 2.5 x 1.0 cm.  There are prominent right hilar lymph nodes. The largest individual lymph node measures 1.7 x 1.1 cm. A second right hilar lymph node measures 1.3 x 1.2 cm. No other adenopathy is appreciable.  There is left ventricular hypertrophy. There is calcification in multiple coronary artery regions. Pericardium is not thickened.  Visualized upper abdomen, the adrenals appear unremarkable. There is atherosclerotic change in aorta. There is equivocal hepatic steatosis.  There are no blastic or lytic bone lesions. There is degenerative change in the thoracic spine with areas of endplate concavity at several levels, likely due to underlying osteoporosis.  IMPRESSION: Underlying emphysema. Multiple pulmonary nodular lesions, largest in the right middle lobe measuring 2.8 x 2.3 cm. Multifocal neoplasm is of concern. Advise PET-CT to further evaluate.  Consolidation right base posteriorly. This appearance by CT is more consistent with pneumonia than neoplasm, although a followup study in approximately 4 weeks to further evaluate this area may well be warranted.  Right hilar adenopathy.  Thyroid nodular lesions as noted above. Advise thyroid ultrasound to further evaluate.  These results will be called to the ordering clinician or representative by the Radiologist Assistant, and communication documented in the PACS or zVision Dashboard.  Electronically Signed: By: Lowella Grip III M.D. On: 01/04/2015 10:26   Mr Jeri Cos TD Contrast  01/05/2015   CLINICAL DATA:  Right-sided hemi paresis. Unintentional weight loss. Brain masses on CT. Evidence of metastatic disease.  EXAM: MRI HEAD WITHOUT AND WITH CONTRAST  TECHNIQUE: Multiplanar, multiecho pulse sequences of the brain and  surrounding structures were obtained without and with intravenous contrast.  CONTRAST:  69m MULTIHANCE GADOBENATE DIMEGLUMINE 529 MG/ML IV SOLN  COMPARISON:  Head CT 01/03/2015  FINDINGS: There is no evidence of acute infarct, midline shift, or extra-axial fluid collection. There is mild to moderate generalized cerebral atrophy. Patchy periventricular and subcortical white matter T2 hyperintensities are nonspecific but compatible with mild-to-moderate chronic small vessel ischemic disease.  Brain masses described on the recent CT demonstrate solid enhancement and measure 3.4 x 2.5 cm in the inferior right cerebellar hemisphere and 2.8 x 2.1 cm in the posterior left frontal lobe, each with mild surrounding vasogenic edema. Both lesions demonstrate areas of susceptibility artifact consistent with chronic blood products. A separate focus of chronic microhemorrhage is noted slightly more anteriorly in the right cerebellum without associated enhancement. There is a prominent vessel which courses into the superior aspect of the left frontal lesion. No other enhancing brain lesions are identified.  The bone marrow signal of the skull is diffusely heterogeneous on precontrast T1 weighted images and demonstrates diffusely heterogeneous enhancement after contrast administration. No destructive skull lesion is identified, however there are scattered subcentimeter foci of discrete enhancement. No dural-based mass is seen, although the right cerebellar mass does extend towards the dural surface.  Orbits are unremarkable. Paranasal sinuses and mastoid air cells are clear. Major intracranial vascular flow voids are preserved.  IMPRESSION: 1. Two enhancing brain masses, consistent with metastases. Mild surrounding edema without significant mass effect. 2. Diffusely heterogeneous bone marrow in the skull, nonspecific however metastatic disease is a consideration.   Electronically Signed   By: ALogan BoresM.D.   On: 01/05/2015  13:37   Nm Bone Scan Whole Body  01/05/2015   CLINICAL DATA:  Brain metastases, as well as lucent lesions in the calvarium concerning for bone mets  EXAM: NUCLEAR MEDICINE WHOLE BODY BONE  SCAN  TECHNIQUE: Whole body anterior and posterior images were obtained approximately 3 hours after intravenous injection of radiopharmaceutical.  RADIOPHARMACEUTICALS:  25 mCi Technetium-76mMDP IV  COMPARISON:  Multiple studies performed september 2016.  FINDINGS: Multiple foci of increased uptake involving the bilateral ribs. These appear to correspond to numerous rib fractures visible on the 01/04/15 CT scan. Lesion left femoral neck and trochanters consistent with fracture seen on same CT scan. Focus of uptake distal left forearm. Minimally heterogenous uptake in the calvarium without definite focal metastatic lesion there.  Degenerative uptake in the knees, right worse than left. Degenerative uptake in the ankles and probably degenerative uptake in the bilateral hindfeet.Degenerative uptake bilateral AC joints.  IMPRESSION: Lesions bilateral ribs and left femur consistent with fractures visible on recent CT scan.  Uptake distal left forearm:  Forearm radiographs recommended.  Minimally heterogenous uptake in the calvarium without definitive metastasis in this area.   Electronically Signed   By: RSkipper ClicheM.D.   On: 01/05/2015 17:00   Ct Abdomen Pelvis W Contrast  01/05/2015   CLINICAL DATA:  64year old female with concern for malignancy  EXAM: CT ABDOMEN AND PELVIS WITH CONTRAST  TECHNIQUE: Multidetector CT imaging of the abdomen and pelvis was performed using the standard protocol following bolus administration of intravenous contrast.  CONTRAST:  874mOMNIPAQUE IOHEXOL 300 MG/ML  SOLN  COMPARISON:  Chest CT dated 01/04/2015  FINDINGS: Evaluation is limited due to respiratory motion artifact.  The 4.6 x 2.8 cm pleural based masslike opacity is noted at the right lung base posteriorly.  No intra-abdominal free air  or free fluid identified.  The liver appears unremarkable. There are multiple stones within the gallbladder. No pericholecystic fluid or evidence of gallbladder inflammation. The pancreas appears unremarkable. There is slight prominence of the head of the pancreas with extension into the duodenal C-loop. No discrete lesion identified. There is no atrophy of the gland or duct dilatation. The spleen appears unremarkable. A splenule is noted. There is apparent thickening of the left adrenal gland on the coronal view 58 be related to an underlying is normal. The right adrenal gland is unremarkable.  There is lobulated appearance of the renal cortices bilaterally. There is a 2.4 x 2.1 cm hypoenhancing lesion in the superior pole of the left kidney. Ultrasound is recommended for further characterization. Subcentimeter scattered bilateral renal hypodense lesions are too small to characterize. There is no hydronephrosis on either side. The visualized ureters and urinary bladder appear unremarkable. Hysterectomy.  There is sigmoid diverticulosis with muscular hypertrophy. No active inflammation. Moderate stool noted throughout the colon. There no evidence of bowel obstruction or inflammation.  Advanced aortoiliac atherosclerotic disease. The origins of the celiac axis, SMA, IMA as well as the origins of the renal arteries are patent. No portal venous gas identified. There is no lymphadenopathy.  Osteopenia with degenerative changes of the spine. There is compression deformity of the superior endplate of the T1L97ertebra, age indeterminate, likely chronic. Clinical correlation is recommended. There is fracture of the greater trochanter of the left femur fossa on the prior CT dated 10/23/2014. Old left posterior rib fractures noted. No new fracture identified. Small scattered lucencies throughout the lumbar spine may be related to osteopenia or malignancy such as multiple myeloma or metastatic disease.  IMPRESSION: Partially  visualized right lung base subpleural consolidation/mass as seen on the prior chest CT.  Left renal upper pole hypodense lesion. Ultrasound is recommended for further initial evaluation. MRI may be related for additional characterization  depending on the ultrasound findings.  Cholelithiasis.  Sigmoid diverticulosis. No evidence of bowel obstruction or inflammation.  Osteopenia with fracture of the greater trochanter of the left femur as well as old left posterior rib fractures. Small lucencies throughout the lumbar vertebra may be related to osteopenia or represent multiple myeloma/ metastatic disease. Bone scan may provide better evaluation if clinically indicated.   Electronically Signed   By: Anner Crete M.D.   On: 01/05/2015 03:46   US Renal  01/05/2015   CLINICAL DATA:  Hypertension.  Indeterminate left renal mass on CT.  EXAM: RENAL / URINARY TRACT ULTRASOUND COMPLETE  COMPARISON:  CT of 01/04/2015.  FINDINGS: Right Kidney:  Length: 9.7 cm. No hydronephrosis. Normal renal cortical thickness and echogenicity.  Left Kidney:  Length: 9.8 cm. No hydronephrosis. Normal renal cortical thickness and echogenicity. Tiny left renal cysts identified. The upper pole dominant left renal lesion described on prior CT is not readily identified or well evaluated.  Bladder:  Appears normal for degree of bladder distention.  IMPRESSION: 1. The upper pole left renal indeterminate lesion on CT is not well evaluated or visualized. Recommend nonemergent outpatient pre and post contrast abdominal MRI. 2.  No acute findings.   Electronically Signed   By: Abigail Miyamoto M.D.   On: 01/05/2015 20:42    CBC  Recent Labs Lab 01/01/15 1524 01/03/15 1819 01/03/15 1841 01/04/15 0440 01/06/15 0524 01/07/15 0550  WBC 7.2 9.0  --  6.0 10.1 7.3  HGB 14.9 15.0 16.3* 13.9 12.9 13.2  HCT 45.5 42.9 48.0* 41.2 38.3 39.9  PLT  --  380  --  353 364 373  MCV 93.7 92.9  --  93.4 95.3 94.8  MCH 30.7 32.5  --  31.5 32.1 31.4  MCHC  32.7 35.0  --  33.7 33.7 33.1  RDW  --  13.6  --  13.7 14.3 14.2  LYMPHSABS  --  1.8  --   --   --   --   MONOABS  --  0.8  --   --   --   --   EOSABS  --  0.1  --   --   --   --   BASOSABS  --  0.0  --   --   --   --     Chemistries   Recent Labs Lab 01/01/15 1508  01/03/15 1819 01/03/15 1841 01/04/15 0440 01/05/15 0534 01/06/15 0524 01/07/15 0550  NA 142  --  138 140 139 137 135 137  K 4.6  --  3.3* 3.4* 3.0* 4.5 3.9 4.3  CL 98  --  103 103 104 105 100* 101  CO2 28  --  23  --  _0 GLUCOSE 92  --  101* 102* 106* 139* 134* 124*  BUN 9  --  _1 CREATININE 0.73  < > 0.79 0.80 0.76 0.73 0.79 0.80  CALCIUM 10.2  --  9.6  --  9.1 9.6 9.3 9.4  AST 20  --  23  --   --   --   --   --   ALT 12  --  13*  --   --   --   --   --   ALKPHOS 99  --  95  --   --   --   --   --   BILITOT 0.7  --  0.7  --   --   --   --   --   < > =  values in this interval not displayed. ------------------------------------------------------------------------------------------------------------------ estimated creatinine clearance is 52.8 mL/min (by C-G formula based on Cr of 0.8). ------------------------------------------------------------------------------------------------------------------ No results for input(s): HGBA1C in the last 72 hours. ------------------------------------------------------------------------------------------------------------------ No results for input(s): CHOL, HDL, LDLCALC, TRIG, CHOLHDL, LDLDIRECT in the last 72 hours. ------------------------------------------------------------------------------------------------------------------ No results for input(s): TSH, T4TOTAL, T3FREE, THYROIDAB in the last 72 hours.  Invalid input(s): FREET3 ------------------------------------------------------------------------------------------------------------------ No results for input(s): VITAMINB12, FOLATE, FERRITIN, TIBC, IRON, RETICCTPCT in the last 72  hours.  Coagulation profile  Recent Labs Lab 01/03/15 1819  INR 0.98    No results for input(s): DDIMER in the last 72 hours.  Cardiac Enzymes No results for input(s): CKMB, TROPONINI, MYOGLOBIN in the last 168 hours.  Invalid input(s): CK ------------------------------------------------------------------------------------------------------------------ Invalid input(s): POCBNP   Recent Labs  01/06/15 0809 01/06/15 1233 01/06/15 1744 01/06/15 2223 01/07/15 0752 01/07/15 1217  GLUCAP 111* 169* 148* 178* 123* 126*     Jahnae Mcadoo M.D. [facility] 01/07/2015, 2:05 PM  Pager: [phone] Between 7am to 7pm - call Pager - [phone]  After 7pm go to [email or web address] - password [email or web address]  Call night coverage person covering after 7pm

## 2015-01-07 NOTE — Clinical Social Work Placement (Signed)
   CLINICAL SOCIAL WORK PLACEMENT  NOTE  Date:  01/07/2015  Patient Details  Name: Kristin Griffin MRN: [id number] Date of Birth: August 19, 1950  Clinical Social Work is seeking post-discharge placement for this patient at the [place] level of care (*CSW will initial, date and re-position this form in  chart as items are completed):  Yes   Patient/family provided with [facility] Work Department's list of facilities offering this level of care within the geographic area requested by the patient (or if unable, by the patient's family).  Yes   Patient/family informed of their freedom to choose among providers that offer the needed level of care, that participate in Medicare, Medicaid or managed care program needed by the patient, have an available bed and are willing to accept the patient.  Yes   Patient/family informed of [facility]'s ownership interest in [facility] and [facility], as well as of the fact that they are under no obligation to receive care at these facilities.  PASRR submitted to EDS on 01/07/15     PASRR number received on 01/07/15     Existing PASRR number confirmed on       FL2 transmitted to all facilities in geographic area requested by pt/family on 01/07/15     FL2 transmitted to all facilities within larger geographic area on       Patient informed that his/her managed care company has contracts with or will negotiate with certain facilities, including the following:            Patient/family informed of bed offers received.  Patient chooses bed at       Physician recommends and patient chooses bed at      Patient to be transferred to   on  .  Patient to be transferred to facility by       Patient family notified on   of transfer.  Name of family member notified:        PHYSICIAN       Additional Comment:    _______________________________________________ Liz Beach MSW, [place], [place], [phone]

## 2015-01-08 ENCOUNTER — Encounter (HOSPITAL_COMMUNITY)
Admission: EM | Disposition: A | Discharge status: Skilled Nursing Facility | Payer: Self-pay | Source: Home / Self Care | Attending: Internal Medicine

## 2015-01-08 ENCOUNTER — Inpatient Hospital Stay (HOSPITAL_COMMUNITY): Payer: Medicare Other | Admitting: Certified Registered"

## 2015-01-08 ENCOUNTER — Inpatient Hospital Stay (HOSPITAL_COMMUNITY): Payer: Medicare Other

## 2015-01-08 DIAGNOSIS — J984 Other disorders of lung: Secondary | ICD-10-CM

## 2015-01-08 DIAGNOSIS — R918 Other nonspecific abnormal finding of lung field: Secondary | ICD-10-CM | POA: Insufficient documentation

## 2015-01-08 DIAGNOSIS — Z9889 Other specified postprocedural states: Secondary | ICD-10-CM | POA: Insufficient documentation

## 2015-01-08 HISTORY — PX: BRONCHOSCOPY: SUR163

## 2015-01-08 HISTORY — PX: VIDEO BRONCHOSCOPY WITH ENDOBRONCHIAL NAVIGATION: SHX6175

## 2015-01-08 LAB — GLUCOSE, CAPILLARY
GLUCOSE-CAPILLARY: 108 mg/dL — AB (ref 65–99)
GLUCOSE-CAPILLARY: 131 mg/dL — AB (ref 65–99)
Glucose-Capillary: 119 mg/dL — ABNORMAL HIGH (ref 65–99)
Glucose-Capillary: 205 mg/dL — ABNORMAL HIGH (ref 65–99)

## 2015-01-08 LAB — CBC
HEMATOCRIT: 39.7 % (ref 36.0–46.0)
HEMOGLOBIN: 13 g/dL (ref 12.0–15.0)
MCH: 31.1 pg (ref 26.0–34.0)
MCHC: 32.7 g/dL (ref 30.0–36.0)
MCV: 95 fL (ref 78.0–100.0)
Platelets: 339 10*3/uL (ref 150–400)
RBC: 4.18 MIL/uL (ref 3.87–5.11)
RDW: 14 % (ref 11.5–15.5)
WBC: 5.5 10*3/uL (ref 4.0–10.5)

## 2015-01-08 LAB — BASIC METABOLIC PANEL
ANION GAP: 6 (ref 5–15)
BUN: 19 mg/dL (ref 6–20)
CALCIUM: 9.3 mg/dL (ref 8.9–10.3)
CHLORIDE: 104 mmol/L (ref 101–111)
CO2: 28 mmol/L (ref 22–32)
Creatinine, Ser: 0.68 mg/dL (ref 0.44–1.00)
GFR calc non Af Amer: >60 mL/min (ref >60)
Glucose, Bld: 112 mg/dL — ABNORMAL HIGH (ref 65–99)
Potassium: 4.2 mmol/L (ref 3.5–5.1)
Sodium: 138 mmol/L (ref 135–145)

## 2015-01-08 SURGERY — VIDEO BRONCHOSCOPY WITH ENDOBRONCHIAL NAVIGATION
Anesthesia: General

## 2015-01-08 MED ORDER — LIDOCAINE HCL (CARDIAC) 20 MG/ML IV SOLN
INTRAVENOUS | Status: AC
  Filled 2015-01-08: qty 10

## 2015-01-08 MED ORDER — FENTANYL CITRATE (PF) 250 MCG/5ML IJ SOLN
INTRAMUSCULAR | Status: AC
  Filled 2015-01-08: qty 5

## 2015-01-08 MED ORDER — ONDANSETRON HCL 4 MG/2ML IJ SOLN
INTRAMUSCULAR | Status: AC
  Filled 2015-01-08: qty 2

## 2015-01-08 MED ORDER — GLYCOPYRROLATE 0.2 MG/ML IJ SOLN
INTRAMUSCULAR | Status: DC | PRN
  Administered 2015-01-08: 0.2 mg via INTRAVENOUS
  Administered 2015-01-08: 0.6 mg via INTRAVENOUS

## 2015-01-08 MED ORDER — ARTIFICIAL TEARS OP OINT
TOPICAL_OINTMENT | OPHTHALMIC | Status: DC | PRN
  Administered 2015-01-08: 1 via OPHTHALMIC

## 2015-01-08 MED ORDER — PHENYLEPHRINE HCL 10 MG/ML IJ SOLN
10.0000 mg | INTRAMUSCULAR | Status: DC | PRN
  Administered 2015-01-08: 15 ug/min via INTRAVENOUS

## 2015-01-08 MED ORDER — EPHEDRINE SULFATE 50 MG/ML IJ SOLN
INTRAMUSCULAR | Status: AC
  Filled 2015-01-08: qty 1

## 2015-01-08 MED ORDER — 0.9 % SODIUM CHLORIDE (POUR BTL) OPTIME
TOPICAL | Status: DC | PRN
  Administered 2015-01-08: 1000 mL

## 2015-01-08 MED ORDER — SODIUM CHLORIDE 0.9 % IJ SOLN
INTRAMUSCULAR | Status: AC
  Filled 2015-01-08: qty 10

## 2015-01-08 MED ORDER — LACTATED RINGERS IV SOLN
INTRAVENOUS | Status: DC
  Administered 2015-01-08: 10:00:00 via INTRAVENOUS

## 2015-01-08 MED ORDER — MIDAZOLAM HCL 2 MG/2ML IJ SOLN
INTRAMUSCULAR | Status: AC
  Filled 2015-01-08: qty 4

## 2015-01-08 MED ORDER — EPHEDRINE SULFATE 50 MG/ML IJ SOLN
INTRAMUSCULAR | Status: DC | PRN
  Administered 2015-01-08 (×2): 5 mg via INTRAVENOUS

## 2015-01-08 MED ORDER — ARTIFICIAL TEARS OP OINT
TOPICAL_OINTMENT | OPHTHALMIC | Status: AC
  Filled 2015-01-08: qty 3.5

## 2015-01-08 MED ORDER — PROPOFOL 10 MG/ML IV BOLUS
INTRAVENOUS | Status: DC | PRN
  Administered 2015-01-08: 100 mg via INTRAVENOUS
  Administered 2015-01-08 (×2): 30 mg via INTRAVENOUS

## 2015-01-08 MED ORDER — HYDROMORPHONE HCL 1 MG/ML IJ SOLN: 0.2500 mg | INTRAMUSCULAR | Status: DC | PRN

## 2015-01-08 MED ORDER — PROPOFOL 10 MG/ML IV BOLUS
INTRAVENOUS | Status: AC
  Filled 2015-01-08: qty 20

## 2015-01-08 MED ORDER — ONDANSETRON HCL 4 MG/2ML IJ SOLN
INTRAMUSCULAR | Status: DC | PRN
  Administered 2015-01-08: 4 mg via INTRAVENOUS

## 2015-01-08 MED ORDER — PROMETHAZINE HCL 25 MG/ML IJ SOLN: 6.2500 mg | INTRAMUSCULAR | Status: DC | PRN

## 2015-01-08 MED ORDER — ROCURONIUM BROMIDE 100 MG/10ML IV SOLN
INTRAVENOUS | Status: DC | PRN
  Administered 2015-01-08: 30 mg via INTRAVENOUS
  Administered 2015-01-08: 5 mg via INTRAVENOUS

## 2015-01-08 MED ORDER — FENTANYL CITRATE (PF) 100 MCG/2ML IJ SOLN
INTRAMUSCULAR | Status: DC | PRN
  Administered 2015-01-08: 100 ug via INTRAVENOUS

## 2015-01-08 MED ORDER — PHENYLEPHRINE HCL 10 MG/ML IJ SOLN
INTRAMUSCULAR | Status: DC | PRN
  Administered 2015-01-08: 60 ug via INTRAVENOUS

## 2015-01-08 MED ORDER — LACTATED RINGERS IV SOLN
INTRAVENOUS | Status: DC | PRN
  Administered 2015-01-08: 10:00:00 via INTRAVENOUS

## 2015-01-08 MED ORDER — LIDOCAINE HCL 4 % MT SOLN
OROMUCOSAL | Status: DC | PRN
  Administered 2015-01-08: 4 mL via TOPICAL

## 2015-01-08 MED ORDER — ROCURONIUM BROMIDE 50 MG/5ML IV SOLN
INTRAVENOUS | Status: AC
  Filled 2015-01-08: qty 1

## 2015-01-08 MED ORDER — NEOSTIGMINE METHYLSULFATE 10 MG/10ML IV SOLN
INTRAVENOUS | Status: DC | PRN
  Administered 2015-01-08: 4 mg via INTRAVENOUS

## 2015-01-08 MED ORDER — LIDOCAINE HCL (CARDIAC) 20 MG/ML IV SOLN
INTRAVENOUS | Status: DC | PRN
  Administered 2015-01-08: 80 mg via INTRAVENOUS

## 2015-01-08 SURGICAL SUPPLY — 42 items
BRUSH CYTOL CELLEBRITY 1.5X140 (MISCELLANEOUS) ×3 IMPLANT
BRUSH SUPERTRAX BIOPSY (INSTRUMENTS) ×3 IMPLANT
BRUSH SUPERTRAX NDL-TIP CYTO (INSTRUMENTS) IMPLANT
CANISTER SUCTION 2500CC (MISCELLANEOUS) ×3 IMPLANT
CHANNEL WORK EXTEND EDGE 180 (KITS) IMPLANT
CHANNEL WORK EXTEND EDGE 45 (KITS) IMPLANT
CHANNEL WORK EXTEND EDGE 90 (KITS) IMPLANT
CONT SPEC 4OZ CLIKSEAL STRL BL (MISCELLANEOUS) ×3 IMPLANT
COVER DOME SNAP 22 D (MISCELLANEOUS) ×3 IMPLANT
COVER TABLE BACK 60X90 (DRAPES) ×3 IMPLANT
FILTER STRAW FLUID ASPIR (MISCELLANEOUS) IMPLANT
FORCEPS BIOP RJ4 1.8 (CUTTING FORCEPS) IMPLANT
FORCEPS BIOP SUPERTRX PREMAR (INSTRUMENTS) IMPLANT
GAUZE SPONGE 4X4 12PLY STRL (GAUZE/BANDAGES/DRESSINGS) ×3 IMPLANT
GLOVE BIO SURGEON STRL SZ 6.5 (GLOVE) ×2 IMPLANT
GLOVE BIO SURGEON STRL SZ7.5 (GLOVE) ×6 IMPLANT
GLOVE BIO SURGEONS STRL SZ 6.5 (GLOVE) ×1
GOWN STRL REUS W/ TWL LRG LVL3 (GOWN DISPOSABLE) ×2 IMPLANT
GOWN STRL REUS W/TWL LRG LVL3 (GOWN DISPOSABLE) ×4
KIT CLEAN ENDO COMPLIANCE (KITS) ×3 IMPLANT
KIT LOCATABLE GUIDE (CANNULA) IMPLANT
KIT MARKER FIDUCIAL DELIVERY (KITS) IMPLANT
KIT PROCEDURE EDGE 180 (KITS) IMPLANT
KIT PROCEDURE EDGE 45 (KITS) IMPLANT
KIT PROCEDURE EDGE 90 (KITS) ×3 IMPLANT
KIT ROOM TURNOVER OR (KITS) ×3 IMPLANT
MARKER SKIN DUAL TIP RULER LAB (MISCELLANEOUS) ×3 IMPLANT
NEEDLE BIOPSY TRANSBRONCH 21G (NEEDLE) IMPLANT
NEEDLE SONO TIP II EBUS (NEEDLE) IMPLANT
NEEDLE SUPERTRX PREMARK BIOPSY (NEEDLE) ×6 IMPLANT
NS IRRIG 1000ML POUR BTL (IV SOLUTION) ×3 IMPLANT
OIL SILICONE PENTAX (PARTS (SERVICE/REPAIRS)) ×3 IMPLANT
PAD ARMBOARD 7.5X6 YLW CONV (MISCELLANEOUS) ×6 IMPLANT
PATCHES PATIENT (LABEL) ×3 IMPLANT
SYR 20CC LL (SYRINGE) ×3 IMPLANT
SYR 20ML ECCENTRIC (SYRINGE) ×6 IMPLANT
SYR 50ML SLIP (SYRINGE) IMPLANT
SYR 5ML LUER SLIP (SYRINGE) IMPLANT
TOWEL OR 17X24 6PK STRL BLUE (TOWEL DISPOSABLE) ×3 IMPLANT
TRAP SPECIMEN MUCOUS 40CC (MISCELLANEOUS) ×3 IMPLANT
TUBE CONNECTING 20'X1/4 (TUBING) ×1
TUBE CONNECTING 20X1/4 (TUBING) ×2 IMPLANT

## 2015-01-08 NOTE — Transfer of Care (Signed)
Immediate Anesthesia Transfer of Care Note  Patient: Kristin Griffin  Procedure(s) Performed: Procedure(s): VIDEO BRONCHOSCOPY WITH ENDOBRONCHIAL NAVIGATION (N/A)  Patient Location: PACU  Anesthesia Type:General  Level of Consciousness: awake, alert  and oriented  Airway & Oxygen Therapy: Patient Spontanous Breathing and Patient connected to nasal cannula oxygen  Post-op Assessment: Report given to RN  Post vital signs: Reviewed and stable  Last Vitals:  Filed Vitals:   01/08/15 0646  BP: 159/76  Pulse: 66  Temp: 36.6 C  Resp: 16    Complications: No apparent anesthesia complications

## 2015-01-08 NOTE — Clinical Social Work Note (Signed)
Clinical Social Worker continuing to follow patient and family for support and discharge planning needs.  CSW spoke with patient and family at bedside to provide available SNF bed offers as a back up plan to inpatient rehab.  Patient continues to request inpatient rehab as primary choice but is open to SNF pending insurance denials [facility] bed availability.  Patient with list of available beds as of today.  Per MD, patient not medically ready at this time.  CSW remains available for support and to facilitate patient discharge needs once medically stable.  Kristin Griffin, [place]

## 2015-01-08 NOTE — Op Note (Signed)
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  Estimated Blood Loss: none  Complications: none apparent  Indications and History: Kristin Griffin is a 64 y.o. 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  Recommendation was made to pursue tissue diagnosis via navigational bronchoscopy. The risks, benefits, complications, treatment options and expected outcomes were discussed with the patient.  The possibilities of pneumothorax, pneumonia, reaction to medication, pulmonary aspiration, perforation of a viscus, bleeding, failure to diagnose a condition and creating a complication requiring transfusion or operation were discussed with the patient who freely signed the consent.    Description of Procedure: The patient was seen in the Preoperative Area, was examined and was deemed appropriate to proceed.  The patient was taken to [facility] OR 10, identified as Kristin Griffin and the procedure verified as Flexible Video Fiberoptic Bronchoscopy.  A Time Out was held and the above information confirmed.   Utilizing [employer] a virtual tracheobronchial tree was generated from the patient's CT scan of the chest dated 01/04/15 to allow the creation of distinct navigation pathways to the patient's parenchymal abnormalities. General anesthesia was initiated and the patient  was orally intubated. The video fiberoptic bronchoscope was introduced via the endotracheal tube and a general inspection was performed which showed normal airways throughout with no  evidence for abnormal secretions or endobronchial lesions. The extendable working channel and locator guide were introduced into the bronchoscope. The distinct navigation pathways prepared prior to this procedure were then utilized to navigate to within 0.5 cm of patient's dominant pulmonary nodule identified on CT scan located in the superior segment of the right lower lobe. The extendable working channel was secured into place and the locator guide was withdrawn. Under fluoroscopic guidance transbronchial brushings, transbronchial Wang needle biopsies, and transbronchial forceps biopsies were performed to be sent for cytology and pathology. Initial quick stain pathology was given persistent with non-small cell carcinoma suspected to be adenocarcinoma. Final pathology is pending. Given these results and the presence of other imaging of metastatic disease, hilar nodal biopsies for staging were deferred.  At the end of the procedure a general airway inspection was performed and there was no evidence of active bleeding. The bronchoscope was removed.  The patient tolerated the procedure well. There was no significant blood loss and there were no obvious complications. A post-procedural chest x-ray is pending.  Samples: 1. Transbronchial brushings from superior segment of the right lower lobe 2. Transbronchial Wang needle biopsies from superior segment of the right lower lobe 3. Transbronchial forceps biopsies from superior segment of the right lower lobe   Plans:  The patient will be transferred back to her regular hospital room when recovered from anesthesia and after chest x-ray is reviewed. We will review the cytology, pathology results with the patient when they become available.  If the cytology is consistent with adenocarcinoma within we will certainly attempt to send her transbronchial biopsy tissue for molecular testing for the EGFR mutation or the alk-1 rearrangement.   Baltazar Apo, MD,  PhD 01/08/2015, 11:18 AM [facility] [phone] or if no answer [phone]

## 2015-01-08 NOTE — Progress Notes (Signed)
[facility]                                                                              Patient Demographics  Kristin Griffin, is a 64 y.o. female, DOB - 10/16/1950, HRC:[id number]  Admit date - 01/03/2015   Admitting Physician Theressa Millard, MD  Outpatient Primary MD for the patient is Delman Cheadle, MD  LOS - 5   Chief Complaint  Patient presents with  . Weakness       Brief HPI   Kristin Griffin is a 64 y.o. female with a history of HTN who presents to the ED with complaints of Right sided Weakness and progressive decline for the past 3 weeks.The patient had presented with unintentional weight loss of 10 pounds in the past 5 weeks, no fevers or chills. Patient's brothers notice that she was walking and dragging her right leg and brought her to the ED. CT head showed 2.3 into 1.4 cm mass in the right cerebellar hemisphere, 2.5 and 2.1 cm mass in the left centrum semiovale with white matter edema. It was followed by a CT chest with contrast to evaluate for a primary. CT chest showed multiple pulmonary nodules. Patient was referred to admission.  Assessment & Plan    Principal Problem:   Brain mass, right lung mass, bony lucencies in the lumbar spine, renal lesion, underlying smoking history one pack per day for 45-50 years - CT head showed 2.3 into 1.4 cm mass in the right cerebellar hemisphere, 2.5 and 2.1 cm mass in the left centrum semiovale with white matter edema.  - Patient started on IV Decadron - Neurosurgery, Onc consulted. Rad Onc consulted - pulmonology consulted, lung biopsy done today, suspecting adenocarcinoma, final biopsy pending  Right lung mass - Patient has multiple lung masses, including spiculated lesion in the right middle lobe 2.8X2.3 centimeter, underlying emphysema, multifocal neoplasm also of concern - pulm, Onc consulted, Dr Earlie Server and radiation oncology following - Patient was placed on levofloxacin for possible postobstructive  pneumonia as per CT chest  Left renal upper pole lesion, lumbar vertebra lesions - Renal ultrasound upper left renal lesion not visualized, recommend nonemergent MRI   Protein-calorie malnutrition, severe with underlying possible malignancy - Nutrition consult  Right-sided weakness likely due to the brain masses and edema - Continue physical therapy, IV Decadron  Bone lesions: Bone scan: Lesions bilateral ribs and left femur consistent with fractures visible on recent CT scan. No mets   Hypertension Currently stable, continue verapamil  Code Status: Full code  Family Communication: Discussed in detail with the patient, all imaging results, lab results explained to the patient and son at the bedside.   Disposition Plan: Not medically ready  Time Spent in minutes   39mnutes  Procedures  CT head CT chest, CT abdomen and pelvis  Consults   Pulm IR Neurosurgery oncology   DVT Prophylaxis  heparin   Medications  Scheduled Meds: . [MAR Hold] calcium carbonate  1 tablet Oral Q breakfast  . [MAR Hold] dexamethasone  4 mg Intravenous 4 times per day  . [MAR Hold] feeding supplement (ENSURE ENLIVE)  237 mL Oral BID  BM  . [MAR Hold] heparin subcutaneous  5,000 Units Subcutaneous 3 times per day  . [MAR Hold] insulin aspart  0-15 Units Subcutaneous TID WC  . [MAR Hold] insulin aspart  3 Units Subcutaneous TID WC  . [MAR Hold] levofloxacin  750 mg Oral Daily  . [MAR Hold] multivitamin with minerals  1 tablet Oral Daily  . [MAR Hold] omega-3 acid ethyl esters  1 g Oral Daily  . [MAR Hold] pantoprazole  40 mg Oral QHS  . [MAR Hold] sodium chloride  3 mL Intravenous Q12H  . [MAR Hold] verapamil  240 mg Oral QHS   Continuous Infusions: . lactated ringers 50 mL/hr at 01/08/15 0941   PRN Meds:.[MAR Hold] sodium chloride, [MAR Hold] acetaminophen **OR** [MAR Hold] acetaminophen, [MAR Hold] alum & mag hydroxide-simeth, HYDROmorphone (DILAUDID) injection, [MAR Hold]  HYDROmorphone  (DILAUDID) injection, [MAR Hold] ondansetron **OR** [MAR Hold] ondansetron (ZOFRAN) IV, [MAR Hold] oxyCODONE, promethazine, [MAR Hold] sodium chloride   Antibiotics   Anti-infectives    Start     Dose/Rate Route Frequency Ordered Stop   01/05/15 1100  [MAR Hold]  levofloxacin (LEVAQUIN) tablet 750 mg     (MAR Hold since 01/08/15 0904)   750 mg Oral Daily 01/05/15 1028     01/04/15 1200  levofloxacin (LEVAQUIN) IVPB 750 mg  Status:  Discontinued     750 mg 100 mL/hr over 90 Minutes Intravenous Every 24 hours 01/04/15 1111 01/05/15 1028        Subjective:   Kristin Griffin was seen and examined today. Denies any specific complaints, wants to eat a regular diet after the biopsy. No fevers or chills or any wheezing, shortness of breath. Son at the bedside. Patient denies dizziness, chest pain, shortness of breath, abdominal pain, N/V/D/C. afebrile.  Objective:   Blood pressure 154/84, pulse 79, temperature 97.7 F (36.5 C), temperature source Oral, resp. rate 15, height '5\' 6"'  (1.676 m), weight 47.7 kg (105 lb 2.6 oz), SpO2 95 %.  Wt Readings from Last 3 Encounters:  01/03/15 47.7 kg (105 lb 2.6 oz)  02/03/14 51.166 kg (112 lb 12.8 oz)  11/21/13 51.256 kg (113 lb)     Intake/Output Summary (Last 24 hours) at 01/08/15 1140 Last data filed at 01/08/15 1115  Gross per 24 hour  Intake   1043 ml  Output    700 ml  Net    343 ml    Exam  General: Alert and oriented x 3, NAD  HEENT:  PERRLA, EOMI  Neck: Supple, no JVD, no masses  CVS: S1 S2 clear, RRR  Respiratory: Clear to auscultation bilaterally  Abdomen: Soft, nontender, nondistended, + bowel sounds  Ext: no cyanosis clubbing or edema  Neuro: no new weakness  Skin: No rashes  Psych: Normal affect and demeanor, alert and oriented x3    Data Review   Micro Results Recent Results (from the past 240 hour(s))  Urine culture     Status: None   Collection Time: 01/03/15  8:10 PM  Result Value Ref Range Status    Specimen Description URINE, CLEAN CATCH  Final   Special Requests NONE  Final   Culture MULTIPLE SPECIES PRESENT, SUGGEST RECOLLECTION  Final   Report Status 01/05/2015 FINAL  Final    Radiology Reports Dg Chest 2 View  01/03/2015   CLINICAL DATA:  Initial evaluation for acute right-sided weakness.  EXAM: CHEST  2 VIEW  COMPARISON:  None.  FINDINGS: Transverse heart size at the upper limits of normal. Mediastinal silhouette  within normal limits. Atheromatous plaque present within the aortic arch.  Lungs are mildly hyperinflated with attenuation of the pulmonary markings, consistent with emphysema. Minimal left basilar atelectasis/scarring. Similarly, probable parenchymal scarring within the peripheral right lower lobe. No focal infiltrate, pulmonary edema, or pleural effusion. No pneumothorax. There is a 15 mm nodular and somewhat lobulated density overlying the right upper lobe, indeterminate.  Osteopenia noted.  No acute osseus abnormality.  IMPRESSION: 1. Emphysema.  No superimposed active cardiopulmonary disease. 2. 14 mm nodular density within the right upper lobe. Follow-up examination with cross-sectional imaging of the chest is recommended for further evaluation.   Electronically Signed   By: Jeannine Boga M.D.   On: 01/03/2015 21:39   Ct Head Wo Contrast  01/03/2015   CLINICAL DATA:  Right-sided hemiparesis.  EXAM: CT HEAD WITHOUT CONTRAST  TECHNIQUE: Contiguous axial images were obtained from the base of the skull through the vertex without intravenous contrast.  COMPARISON:  None.  FINDINGS: 2.5 x 2.1 cm mass is noted in the left centrum semiovale with surrounding white matter edema. Also noted is 2.3 x 1.4 cm mass in right cerebellar hemisphere with surrounding white matter edema. These findings most consistent with metastatic disease. Ventricular size is within normal limits. No significant midline shift is seen at this time. No definite hemorrhage is noted. Multiple small lucencies  are noted in the bony calvarium which potentially may represent metastatic disease.  IMPRESSION: Masses with surrounding white matter edema are noted in the left parietal lobe and right cerebellar hemisphere most consistent with metastatic disease. Also noted are multiple small lucencies in the bony calvarium which may represent metastatic disease. MRI with and without gadolinium is recommended for further evaluation.   Electronically Signed   By: Marijo Conception, M.D.   On: 01/03/2015 19:40   Ct Chest W Contrast  01/04/2015   ADDENDUM REPORT: 01/04/2015 10:31  ADDENDUM: There is atherosclerotic change in aorta but no aneurysm. No thoracic aortic aneurysm or dissection. No pulmonary embolus appreciable. There are scattered foci of coronary artery calcification.   Electronically Signed   By: Lowella Grip III M.D.   On: 01/04/2015 10:31   01/04/2015   CLINICAL DATA:  Pulmonary nodular lesion on chest radiograph  EXAM: CT CHEST WITH CONTRAST  TECHNIQUE: Multidetector CT imaging of the chest was performed during intravenous contrast administration.  CONTRAST:  9m OMNIPAQUE IOHEXOL 300 MG/ML  SOLN  COMPARISON:  Chest radiograph January 03, 2015  FINDINGS: There is underlying centrilobular emphysematous change. There is an irregular opacity with mild adjacent peribronchial thickening in the right apex measuring 1.4 x 0.6 cm, best seen on axial slice 9 series 2163 There is a lobular appearing nodular lesion in the posterior segment of the right upper lobe measuring 1.7 x 1.3 cm, best seen on axial slice 20 series 2845 There is a lobular appearing nodule also in the posterior segment of the right upper lobe measuring 1.2 x 1.2 cm, best seen on axial slice 26 series 2364 There is a nodular lesion abutting the minor fissure in the posterior segment left upper lobe on slice 30 series 2680measuring 0.9 x 0.9 cm. There is a nodular lesion in the superior segment of the right lower lobe seen on axial slice 36 series  2321measuring 1.2 x 1.2 cm. There is an irregular nodular lesion in the superior segment of the right lower lobe measuring 1.2 x 1.1 cm on axial slice 38 series 2224 There is an irregular spiculated lesion  in the lateral segment of the right middle lobe measuring 2.8 x 2.3 cm. This lesion is best seen on axial slice 40 series 226. There is an area of consolidation in the posterior segment of the right lower lobe. There are scattered areas of presumed scarring in the lungs bilaterally.  There is a nodular lesion in the right lobe of the thyroid measuring 1.7 x 0.7 cm. There is a nodular lesion arising from the isthmus of the thyroid extending toward the left measuring 2.5 x 1.0 cm.  There are prominent right hilar lymph nodes. The largest individual lymph node measures 1.7 x 1.1 cm. A second right hilar lymph node measures 1.3 x 1.2 cm. No other adenopathy is appreciable.  There is left ventricular hypertrophy. There is calcification in multiple coronary artery regions. Pericardium is not thickened.  Visualized upper abdomen, the adrenals appear unremarkable. There is atherosclerotic change in aorta. There is equivocal hepatic steatosis.  There are no blastic or lytic bone lesions. There is degenerative change in the thoracic spine with areas of endplate concavity at several levels, likely due to underlying osteoporosis.  IMPRESSION: Underlying emphysema. Multiple pulmonary nodular lesions, largest in the right middle lobe measuring 2.8 x 2.3 cm. Multifocal neoplasm is of concern. Advise PET-CT to further evaluate.  Consolidation right base posteriorly. This appearance by CT is more consistent with pneumonia than neoplasm, although a followup study in approximately 4 weeks to further evaluate this area may well be warranted.  Right hilar adenopathy.  Thyroid nodular lesions as noted above. Advise thyroid ultrasound to further evaluate.  These results will be called to the ordering clinician or representative by the  Radiologist Assistant, and communication documented in the PACS or zVision Dashboard.  Electronically Signed: By: Lowella Grip III M.D. On: 01/04/2015 10:26   Mr Jeri Cos JF Contrast  01/05/2015   CLINICAL DATA:  Right-sided hemi paresis. Unintentional weight loss. Brain masses on CT. Evidence of metastatic disease.  EXAM: MRI HEAD WITHOUT AND WITH CONTRAST  TECHNIQUE: Multiplanar, multiecho pulse sequences of the brain and surrounding structures were obtained without and with intravenous contrast.  CONTRAST:  72m MULTIHANCE GADOBENATE DIMEGLUMINE 529 MG/ML IV SOLN  COMPARISON:  Head CT 01/03/2015  FINDINGS: There is no evidence of acute infarct, midline shift, or extra-axial fluid collection. There is mild to moderate generalized cerebral atrophy. Patchy periventricular and subcortical white matter T2 hyperintensities are nonspecific but compatible with mild-to-moderate chronic small vessel ischemic disease.  Brain masses described on the recent CT demonstrate solid enhancement and measure 3.4 x 2.5 cm in the inferior right cerebellar hemisphere and 2.8 x 2.1 cm in the posterior left frontal lobe, each with mild surrounding vasogenic edema. Both lesions demonstrate areas of susceptibility artifact consistent with chronic blood products. A separate focus of chronic microhemorrhage is noted slightly more anteriorly in the right cerebellum without associated enhancement. There is a prominent vessel which courses into the superior aspect of the left frontal lesion. No other enhancing brain lesions are identified.  The bone marrow signal of the skull is diffusely heterogeneous on precontrast T1 weighted images and demonstrates diffusely heterogeneous enhancement after contrast administration. No destructive skull lesion is identified, however there are scattered subcentimeter foci of discrete enhancement. No dural-based mass is seen, although the right cerebellar mass does extend towards the dural surface.   Orbits are unremarkable. Paranasal sinuses and mastoid air cells are clear. Major intracranial vascular flow voids are preserved.  IMPRESSION: 1. Two enhancing brain masses, consistent with metastases.  Mild surrounding edema without significant mass effect. 2. Diffusely heterogeneous bone marrow in the skull, nonspecific however metastatic disease is a consideration.   Electronically Signed   By: Logan Bores M.D.   On: 01/05/2015 13:37   Nm Bone Scan Whole Body  01/05/2015   CLINICAL DATA:  Brain metastases, as well as lucent lesions in the calvarium concerning for bone mets  EXAM: NUCLEAR MEDICINE WHOLE BODY BONE SCAN  TECHNIQUE: Whole body anterior and posterior images were obtained approximately 3 hours after intravenous injection of radiopharmaceutical.  RADIOPHARMACEUTICALS:  25 mCi Technetium-14mMDP IV  COMPARISON:  Multiple studies performed september 2016.  FINDINGS: Multiple foci of increased uptake involving the bilateral ribs. These appear to correspond to numerous rib fractures visible on the 01/04/15 CT scan. Lesion left femoral neck and trochanters consistent with fracture seen on same CT scan. Focus of uptake distal left forearm. Minimally heterogenous uptake in the calvarium without definite focal metastatic lesion there.  Degenerative uptake in the knees, right worse than left. Degenerative uptake in the ankles and probably degenerative uptake in the bilateral hindfeet.Degenerative uptake bilateral AC joints.  IMPRESSION: Lesions bilateral ribs and left femur consistent with fractures visible on recent CT scan.  Uptake distal left forearm:  Forearm radiographs recommended.  Minimally heterogenous uptake in the calvarium without definitive metastasis in this area.   Electronically Signed   By: RSkipper ClicheM.D.   On: 01/05/2015 17:00   Ct Abdomen Pelvis W Contrast  01/05/2015   CLINICAL DATA:  64year old female with concern for malignancy  EXAM: CT ABDOMEN AND PELVIS WITH CONTRAST   TECHNIQUE: Multidetector CT imaging of the abdomen and pelvis was performed using the standard protocol following bolus administration of intravenous contrast.  CONTRAST:  851mOMNIPAQUE IOHEXOL 300 MG/ML  SOLN  COMPARISON:  Chest CT dated 01/04/2015  FINDINGS: Evaluation is limited due to respiratory motion artifact.  The 4.6 x 2.8 cm pleural based masslike opacity is noted at the right lung base posteriorly.  No intra-abdominal free air or free fluid identified.  The liver appears unremarkable. There are multiple stones within the gallbladder. No pericholecystic fluid or evidence of gallbladder inflammation. The pancreas appears unremarkable. There is slight prominence of the head of the pancreas with extension into the duodenal C-loop. No discrete lesion identified. There is no atrophy of the gland or duct dilatation. The spleen appears unremarkable. A splenule is noted. There is apparent thickening of the left adrenal gland on the coronal view 58 be related to an underlying is normal. The right adrenal gland is unremarkable.  There is lobulated appearance of the renal cortices bilaterally. There is a 2.4 x 2.1 cm hypoenhancing lesion in the superior pole of the left kidney. Ultrasound is recommended for further characterization. Subcentimeter scattered bilateral renal hypodense lesions are too small to characterize. There is no hydronephrosis on either side. The visualized ureters and urinary bladder appear unremarkable. Hysterectomy.  There is sigmoid diverticulosis with muscular hypertrophy. No active inflammation. Moderate stool noted throughout the colon. There no evidence of bowel obstruction or inflammation.  Advanced aortoiliac atherosclerotic disease. The origins of the celiac axis, SMA, IMA as well as the origins of the renal arteries are patent. No portal venous gas identified. There is no lymphadenopathy.  Osteopenia with degenerative changes of the spine. There is compression deformity of the  superior endplate of the T1B04ertebra, age indeterminate, likely chronic. Clinical correlation is recommended. There is fracture of the greater trochanter of the left femur fossa on  the prior CT dated 10/23/2014. Old left posterior rib fractures noted. No new fracture identified. Small scattered lucencies throughout the lumbar spine may be related to osteopenia or malignancy such as multiple myeloma or metastatic disease.  IMPRESSION: Partially visualized right lung base subpleural consolidation/mass as seen on the prior chest CT.  Left renal upper pole hypodense lesion. Ultrasound is recommended for further initial evaluation. MRI may be related for additional characterization depending on the ultrasound findings.  Cholelithiasis.  Sigmoid diverticulosis. No evidence of bowel obstruction or inflammation.  Osteopenia with fracture of the greater trochanter of the left femur as well as old left posterior rib fractures. Small lucencies throughout the lumbar vertebra may be related to osteopenia or represent multiple myeloma/ metastatic disease. Bone scan may provide better evaluation if clinically indicated.   Electronically Signed   By: Anner Crete M.D.   On: 01/05/2015 03:46   US Renal  01/05/2015   CLINICAL DATA:  Hypertension.  Indeterminate left renal mass on CT.  EXAM: RENAL / URINARY TRACT ULTRASOUND COMPLETE  COMPARISON:  CT of 01/04/2015.  FINDINGS: Right Kidney:  Length: 9.7 cm. No hydronephrosis. Normal renal cortical thickness and echogenicity.  Left Kidney:  Length: 9.8 cm. No hydronephrosis. Normal renal cortical thickness and echogenicity. Tiny left renal cysts identified. The upper pole dominant left renal lesion described on prior CT is not readily identified or well evaluated.  Bladder:  Appears normal for degree of bladder distention.  IMPRESSION: 1. The upper pole left renal indeterminate lesion on CT is not well evaluated or visualized. Recommend nonemergent outpatient pre and post  contrast abdominal MRI. 2.  No acute findings.   Electronically Signed   By: Abigail Miyamoto M.D.   On: 01/05/2015 20:42   Dg C-arm Bronchoscopy  01/08/2015   CLINICAL DATA:    C-ARM BRONCHOSCOPY  Fluoroscopy was utilized by the requesting physician.  No radiographic  interpretation.     CBC  Recent Labs Lab 01/03/15 1819 01/03/15 1841 01/04/15 0440 01/06/15 0524 01/07/15 0550 01/08/15 0748  WBC 9.0  --  6.0 10.1 7.3 5.5  HGB 15.0 16.3* 13.9 12.9 13.2 13.0  HCT 42.9 48.0* 41.2 38.3 39.9 39.7  PLT 380  --  353 364 373 339  MCV 92.9  --  93.4 95.3 94.8 95.0  MCH 32.5  --  31.5 32.1 31.4 31.1  MCHC 35.0  --  33.7 33.7 33.1 32.7  RDW 13.6  --  13.7 14.3 14.2 14.0  LYMPHSABS 1.8  --   --   --   --   --   MONOABS 0.8  --   --   --   --   --   EOSABS 0.1  --   --   --   --   --   BASOSABS 0.0  --   --   --   --   --     Chemistries   Recent Labs Lab 01/01/15 1508 01/03/15 1819  01/04/15 0440 01/05/15 0534 01/06/15 0524 01/07/15 0550 01/08/15 0748  NA 142 138  < > 139 137 135 137 138  K 4.6 3.3*  < > 3.0* 4.5 3.9 4.3 4.2  CL 98 103  < > 104 105 100* 101 104  CO2 28 23  --  '22 24 25 27 28  ' GLUCOSE 92 101*  < > 106* 139* 134* 124* 112*  BUN 9 9  < > '6 11 16 19 19  ' CREATININE 0.73 0.79  < > 0.76  0.73 0.79 0.80 0.68  CALCIUM 10.2 9.6  --  9.1 9.6 9.3 9.4 9.3  AST 20 23  --   --   --   --   --   --   ALT 12 13*  --   --   --   --   --   --   ALKPHOS 99 95  --   --   --   --   --   --   BILITOT 0.7 0.7  --   --   --   --   --   --   < > = values in this interval not displayed. ------------------------------------------------------------------------------------------------------------------ estimated creatinine clearance is 52.8 mL/min (by C-G formula based on Cr of 0.68). ------------------------------------------------------------------------------------------------------------------ No results for input(s): HGBA1C in the last 72  hours. ------------------------------------------------------------------------------------------------------------------ No results for input(s): CHOL, HDL, LDLCALC, TRIG, CHOLHDL, LDLDIRECT in the last 72 hours. ------------------------------------------------------------------------------------------------------------------ No results for input(s): TSH, T4TOTAL, T3FREE, THYROIDAB in the last 72 hours.  Invalid input(s): FREET3 ------------------------------------------------------------------------------------------------------------------ No results for input(s): VITAMINB12, FOLATE, FERRITIN, TIBC, IRON, RETICCTPCT in the last 72 hours.  Coagulation profile  Recent Labs Lab 01/03/15 1819  INR 0.98    No results for input(s): DDIMER in the last 72 hours.  Cardiac Enzymes No results for input(s): CKMB, TROPONINI, MYOGLOBIN in the last 168 hours.  Invalid input(s): CK ------------------------------------------------------------------------------------------------------------------ Invalid input(s): [place]  01/06/15 2223 01/07/15 0752 01/07/15 1217 01/07/15 1702 01/07/15 2151 01/08/15 0817  GLUCAP 178* 123* 126* 230* 112* 108*     RAI,RIPUDEEP M.D. [facility] 01/08/2015, 11:40 AM  Pager: [phone] Between 7am to 7pm - call Pager - [phone]  After 7pm go to [email or web address] - password [email or web address]  Call night coverage person covering after 7pm

## 2015-01-08 NOTE — Clinical Social Work Placement (Signed)
   CLINICAL SOCIAL WORK PLACEMENT  NOTE  Date:  01/08/2015  Patient Details  Name: KALEENA CORROW MRN: [id number] Date of Birth: Jun 11, 1950  Clinical Social Work is seeking post-discharge placement for this patient at the [place] level of care (*CSW will initial, date and re-position this form in  chart as items are completed):  Yes   Patient/family provided with [facility] Work Department's list of facilities offering this level of care within the geographic area requested by the patient (or if unable, by the patient's family).  Yes   Patient/family informed of their freedom to choose among providers that offer the needed level of care, that participate in Medicare, Medicaid or managed care program needed by the patient, have an available bed and are willing to accept the patient.  Yes   Patient/family informed of [place]'s ownership interest in [facility] and [facility], as well as of the fact that they are under no obligation to receive care at these facilities.  PASRR submitted to EDS on 01/07/15     PASRR number received on 01/07/15     Existing PASRR number confirmed on       FL2 transmitted to all facilities in geographic area requested by pt/family on 01/07/15     FL2 transmitted to all facilities within larger geographic area on       Patient informed that his/her managed care company has contracts with or will negotiate with certain facilities, including the following:        Yes   Patient/family informed of bed offers received.  Patient chooses bed at       Physician recommends and patient chooses bed at      Patient to be transferred to   on  .  Patient to be transferred to facility by       Patient family notified on   of transfer.  Name of family member notified:        PHYSICIAN Please sign FL2     Additional Comment:    Barbette Or, [place]

## 2015-01-08 NOTE — Anesthesia Procedure Notes (Signed)
Procedure Name: Intubation Date/Time: 01/08/2015 10:04 AM Performed by: Gaylene Brooks Pre-anesthesia Checklist: Patient identified, Timeout performed, Emergency Drugs available, Suction available and Patient being monitored Patient Re-evaluated:Patient Re-evaluated prior to inductionOxygen Delivery Method: Circle system utilized Preoxygenation: Pre-oxygenation with 100% oxygen Intubation Type: IV induction Ventilation: Mask ventilation without difficulty Laryngoscope Size: Miller and 2 Grade View: Grade I Tube type: Oral Tube size: 8.5 mm Number of attempts: 1 Airway Equipment and Method: Stylet Placement Confirmation: ETT inserted through vocal cords under direct vision,  breath sounds checked- equal and bilateral,  positive ETCO2 and CO2 detector Secured at: 23 cm Tube secured with: Tape Dental Injury: Teeth and Oropharynx as per pre-operative assessment

## 2015-01-08 NOTE — Progress Notes (Signed)
Nutrition Follow-up  DOCUMENTATION CODES:   Severe malnutrition in context of chronic illness, Underweight  INTERVENTION:   Once diet is resumed, continue:  Ensure Enlive po BID, each supplement provides 350 kcal and 20 grams of protein  NUTRITION DIAGNOSIS:   Malnutrition related to chronic illness as evidenced by percent weight loss, severe depletion of body fat, severe depletion of muscle mass.  Ongoing   GOAL:   Patient will meet greater than or equal to 90% of their needs  Progressing  MONITOR:   PO intake, Supplement acceptance, Weight trends, Labs, I & O's  REASON FOR ASSESSMENT:   Malnutrition Screening Tool    ASSESSMENT:   64 y.o. female with a history of HTN who presents to the ED with complaints of Right sided Weakness and progressive decline for the past 3 weeks.  She report having an unintentional weight loss of 10 pounds over the past 5 weeks. She denies any fevers chills or night sweats. Her husband just passed away 1 week ago. Her brothers noticed that sh was walking and dragging her right leg and brought her to the ED. In the ED, a Head Ct was performed and revealed a 2.5 X 2.1 cm mass in the Left Parietal Lobe, and a 2.3 x 1.4 cm mass in the Right Cerebellar Hemisphere consistent with metastatic disease. A Chest X-ray was performed and revealed a RUL nodule  Pt NPO and down for procedure at time of visit. Pt previously on a regular diet, with meal completion stable, but fair (PO: 25-75%). Per MAR, pt is accepting Ensure supplements.   Pt scheduled to a bronchoscopy with tissue dx this AM. 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   CSW following. Plan to d/c to CIR vs SNF once medically stable.   Labs reviewed.   Diet Order:  Diet regular Room service appropriate?: Yes; Fluid consistency:: Thin  Skin:  Reviewed, no issues  Last BM:  01/03/15  Height:   Ht  Readings from Last 1 Encounters:  01/03/15 '5\' 6"'$  (1.676 m)    Weight:   Wt Readings from Last 1 Encounters:  01/03/15 105 lb 2.6 oz (47.7 kg)    Ideal Body Weight:  59 kg  BMI:  Body mass index is 16.98 kg/(m^2).  Estimated Nutritional Needs:   Kcal:  1750-2000  Protein:  75-85 grams  Fluid:  1.7 - 2 L/day  EDUCATION NEEDS:   Education needs addressed  Jenifer A. Jimmye Norman, RD, LDN, CDE Pager: [phone] After hours Pager: [phone]

## 2015-01-08 NOTE — Consult Note (Signed)
Name: Kristin Griffin MRN: [id number] DOB: 01/28/1951     ADMISSION DATE:  01/03/2015 CONSULTATION DATE:  9/14   REFERRING MD :  Tana Coast (triad)   CHIEF COMPLAINT:  Lung mass   BRIEF PATIENT DESCRIPTION: 64yo female former smoker with hx HTN presented 9/11 with 3 week hx R sided weakness, weight loss.  In ER CT head revealed bilateral masses consistent with metastatic disease.  CT chest revealed multiple pulmonary nodules, largest in RML.  PCCM consulted to assist with tissue dx.   SIGNIFICANT EVENTS    STUDIES:  CT chest 9/13>>> multiple pulmonary nodular lesions, largest RML 2.8x2.3cm.   CT abd/pelvis 9/13>> CT head 9/11>>>2.5 x 2.1 cm mass is noted in the left centrum semiovale with surrounding white matter edema. Also noted is 2.3 x 1.4 cm mass in right cerebellar hemisphere with surrounding white matter edema. These findings most consistent with metastatic disease MRI brain 9/13>>>1. Two enhancing brain masses, consistent with metastases. Mild surrounding edema without significant mass effect. 2. Diffusely heterogeneous bone marrow in the skull, nonspecific however metastatic disease is a consideration.   HISTORY OF PRESENT ILLNESS:  64yo female with hx HTN presented 9/11 with 3 week hx R sided weakness, weight loss.  In ER CT head revealed bilateral masses consistent with metastatic disease.  CT chest revealed multiple pulmonary nodules, largest in RML.  PCCM consulted to assist with tissue dx.   Currently c/o mild R pleuritic chest pain.  Prior to admit denies fever, cough, chills, hemoptysis, orthopnea, purulent sputum. Denies headache, visual disturbances, syncope.  Quit smoking 2015.  45 pack year hx.   PAST MEDICAL HISTORY :   has a past medical history of Hypertension.  has past surgical history that includes Abdominal hysterectomy. Prior to Admission medications   Medication Sig Start Date End Date Taking? Authorizing Provider  calcium carbonate (OS-CAL) 600 MG TABS  tablet Take 600 mg by mouth daily with breakfast.    Yes Historical Provider, MD  Multiple Vitamin (MULTIVITAMIN) capsule Take 1 capsule by mouth daily.   Yes Historical Provider, MD  Omega-3 Fatty Acids (FISH OIL) 1000 MG CAPS Take by mouth.   Yes Historical Provider, MD  verapamil (CALAN-SR) 240 MG CR tablet TAKE 1 TABLET BY MOUTH AT BEDTIME 11/18/14  Yes Chelle Jeffery, PA-C  alendronate (FOSAMAX) 70 MG tablet Take 1 tablet (70 mg total) by mouth every 7 (seven) days. Take with a full glass of water on an empty stomach. Patient not taking: Reported on 01/01/2015 12/21/13   Shawnee Knapp, MD  gentamicin (GARAMYCIN) 0.3 % ophthalmic solution Place 2 drops into the left eye every 4 (four) hours. Ok to use 2 drops every hour until you begin to improve. 02/03/14   Shawnee Knapp, MD  lisinopril-hydrochlorothiazide (PRINZIDE,ZESTORETIC) 20-25 MG per tablet take 1 tablet by mouth once daily 11/16/14   Shawnee Knapp, MD   No Known Allergies  FAMILY HISTORY:  family history is not on file. SOCIAL HISTORY:  reports that she quit smoking about 14 months ago. She does not have any smokeless tobacco history on file. She reports that she drinks alcohol. She reports that she does not use illicit drugs.  REVIEW OF SYSTEMS:   As per HPI - All other systems reviewed and were neg.    SUBJECTIVE:   Feeling well, no new issues overnight   VITAL SIGNS: Temp:  [97.9 F (36.6 C)-98 F (36.7 C)] 97.9 F (36.6 C) (09/16 0646) Pulse Rate:  [66-87] 66 (09/16  8341) Resp:  [16] 16 (09/16 0646) BP: (137-159)/(76-77) 159/76 mmHg (09/16 0646) SpO2:  [93 %-95 %] 93 % (09/16 0646)  PHYSICAL EXAMINATION: General:  Thin female, NAD in bed  Neuro:  Awake, alert, oriented, appropriate HEENT:  Mm moist, no JVD  Cardiovascular:  s1s2 rrr Lungs:  resps even non labored on RA, few scattered rhonchi R>L Abdomen:  Soft, non tender  Musculoskeletal:  Warm and dry, no edema    Recent Labs Lab 01/05/15 0534 01/06/15 0524  01/07/15 0550  NA 137 135 137  K 4.5 3.9 4.3  CL 105 100* 101  CO2 '24 25 27  '$ BUN '11 16 19  '$ CREATININE 0.73 0.79 0.80  GLUCOSE 139* 134* 124*    Recent Labs Lab 01/04/15 0440 01/06/15 0524 01/07/15 0550  HGB 13.9 12.9 13.2  HCT 41.2 38.3 39.9  WBC 6.0 10.1 7.3  PLT 353 364 373   No results found.  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  Bilat brain mass - most certainly foci of metastatic disease.  COPD   PLAN -  Planning for navigation bronchoscopy this am for tissue dx. Patient, son brother understand the procedure, benefits / risks. She elects to proceed.  Neurosurgery, oncology following  Continue decadron  Supplemental O2 as needed    Baltazar Apo, MD, PhD 01/08/2015, 9:46 AM [facility] [phone] or if no answer [phone]

## 2015-01-08 NOTE — Anesthesia Preprocedure Evaluation (Addendum)
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    GI/Hepatic negative GI ROS,   Endo/Other  negative endocrine ROS  Renal/GU negative Renal ROS     Musculoskeletal Generalized weakness   Abdominal   Peds  Hematology   Anesthesia Other Findings   Reproductive/Obstetrics                            Anesthesia Physical Anesthesia Plan  ASA: III  Anesthesia Plan: General   Post-op Pain Management:    Induction: Intravenous  Airway Management Planned: Oral ETT  Additional Equipment:   Intra-op Plan:   Post-operative Plan: Extubation in OR and Possible Post-op intubation/ventilation  Informed Consent: I have reviewed the patients History and Physical, chart, labs and discussed the procedure including the risks, benefits and alternatives for the proposed anesthesia with the patient or authorized representative who has indicated his/her understanding and acceptance.   Dental advisory given  Plan Discussed with: CRNA and Surgeon  Anesthesia Plan Comments:         Anesthesia Quick Evaluation

## 2015-01-08 NOTE — Progress Notes (Signed)
Asked by SW/Donna to reassess pt for a possible inpt rehab admission. Noted pt lives alone and family concerned for her to return home. Unless pt has 24/7 support available long term, feel SNF is most appropriate venue for her long term needs. Inpt rehab admission would not be an appropriate venue at this time. Please call me with any questions. [phone]

## 2015-01-08 NOTE — Anesthesia Postprocedure Evaluation (Signed)
  Anesthesia Post-op Note  Patient: Kristin Griffin  Procedure(s) Performed: Procedure(s): VIDEO BRONCHOSCOPY WITH ENDOBRONCHIAL NAVIGATION (N/A)  Patient Location: PACU  Anesthesia Type:General  Level of Consciousness: awake and alert   Airway and Oxygen Therapy: Patient Spontanous Breathing  Post-op Pain: none  Post-op Assessment: Post-op Vital signs reviewed LLE Motor Response: Purposeful movement LLE Sensation: Full sensation RLE Motor Response: Purposeful movement RLE Sensation: Full sensation      Post-op Vital Signs: stable  Last Vitals:  Filed Vitals:   01/08/15 1220  BP: 139/70  Pulse: 57  Temp: 36.6 C  Resp: 16    Complications: No apparent anesthesia complications

## 2015-01-08 NOTE — Progress Notes (Signed)
Pt. Not seen for skilled OT today secondary to pt. Currently at test/procedure.  Will check back as pt. Able.  Romana Juniper, COTA/L

## 2015-01-09 LAB — GLUCOSE, CAPILLARY
GLUCOSE-CAPILLARY: 142 mg/dL — AB (ref 65–99)
Glucose-Capillary: 114 mg/dL — ABNORMAL HIGH (ref 65–99)
Glucose-Capillary: 168 mg/dL — ABNORMAL HIGH (ref 65–99)
Glucose-Capillary: 109 mg/dL — ABNORMAL HIGH (ref 65–99)

## 2015-01-09 MED ORDER — POLYETHYLENE GLYCOL 3350 17 G PO PACK
17.0000 g | PACK | Freq: Two times a day (BID) | ORAL | Status: DC
  Administered 2015-01-09 – 2015-01-10 (×3): 17 g via ORAL
  Filled 2015-01-09 (×6): qty 1

## 2015-01-09 MED ORDER — WHITE PETROLATUM GEL
Status: AC
  Administered 2015-01-09: 1
  Filled 2015-01-09: qty 1

## 2015-01-09 MED ORDER — DOCUSATE SODIUM 100 MG PO CAPS
100.0000 mg | ORAL_CAPSULE | Freq: Two times a day (BID) | ORAL | Status: DC
  Administered 2015-01-09 – 2015-01-12 (×4): 100 mg via ORAL
  Filled 2015-01-09 (×6): qty 1

## 2015-01-09 MED ORDER — BISACODYL 10 MG RE SUPP
10.0000 mg | Freq: Once | RECTAL | Status: AC
  Administered 2015-01-09: 10 mg via RECTAL
  Filled 2015-01-09: qty 1

## 2015-01-09 MED ORDER — FLEET ENEMA 7-19 GM/118ML RE ENEM
1.0000 | ENEMA | Freq: Once | RECTAL | Status: DC
  Filled 2015-01-09: qty 1

## 2015-01-09 NOTE — Progress Notes (Signed)
[facility]                                                                              Patient Demographics  Kristin Griffin, is a 64 y.o. female, DOB - [birth year], QVZ:[id number]  Admit date - 01/03/2015   Admitting Physician Theressa Millard, MD  Outpatient Primary MD for the patient is Delman Cheadle, MD  LOS - 6   Chief Complaint  Patient presents with  . Weakness       Brief HPI   Kristin Griffin is a 64 y.o. female with a history of HTN who presents to the ED with complaints of Right sided Weakness and progressive decline for the past 3 weeks.The patient had presented with unintentional weight loss of 10 pounds in the past 5 weeks, no fevers or chills. Patient's brothers notice that she was walking and dragging her right leg and brought her to the ED. CT head showed 2.3 into 1.4 cm mass in the right cerebellar hemisphere, 2.5 and 2.1 cm mass in the left centrum semiovale with white matter edema. It was followed by a CT chest with contrast to evaluate for a primary. CT chest showed multiple pulmonary nodules. Patient was referred to admission.  Assessment & Plan    Principal Problem:   Brain mass, right lung mass, bony lucencies in the lumbar spine, renal lesion, underlying smoking history one pack per day for 45-50 years - CT head showed 2.3 into 1.4 cm mass in the right cerebellar hemisphere, 2.5 and 2.1 cm mass in the left centrum semiovale with white matter edema.  - Patient started on IV Decadron - Neurosurgery, Onc consulted. Rad Onc consulted.  - pulmonology consulted, lung biopsy done 9/16, suspecting adenocarcinoma, final biopsy pending  Right lung mass - Patient has multiple lung masses, including spiculated lesion in the right middle lobe 2.8X2.3 centimeter, underlying emphysema, multifocal neoplasm also of concern - pulm, Onc consulted, Dr Earlie Server and radiation oncology following - Patient was placed on levofloxacin for possible postobstructive  pneumonia as per CT chest - biopsy done 9/16, suspecting adenoca  Left renal upper pole lesion, lumbar vertebra lesions - Renal ultrasound upper left renal lesion not visualized, recommend nonemergent MRI   Protein-calorie malnutrition, severe with underlying possible malignancy - Nutrition consult  Right-sided weakness likely due to the brain masses and edema - Continue physical therapy, IV Decadron  Bone lesions: Bone scan: Lesions bilateral ribs and left femur consistent with fractures visible on recent CT scan. No mets   Hypertension Currently stable, continue verapamil  Code Status: Full code  Family Communication: Discussed in detail with the patient, all imaging results, lab results explained to the patient and son at the bedside.   Disposition Plan: Not medically ready, awaiting biopsy results and further management from other specialists  Time Spent in minutes  20 minutes  Procedures  CT head CT chest, CT abdomen and pelvis  Consults   Pulm IR Neurosurgery oncology   DVT Prophylaxis  heparin   Medications  Scheduled Meds: . calcium carbonate  1 tablet Oral Q breakfast  . dexamethasone  4 mg Intravenous 4 times per day  .  docusate sodium  100 mg Oral BID  . feeding supplement (ENSURE ENLIVE)  237 mL Oral BID BM  . heparin subcutaneous  5,000 Units Subcutaneous 3 times per day  . insulin aspart  0-15 Units Subcutaneous TID WC  . insulin aspart  3 Units Subcutaneous TID WC  . levofloxacin  750 mg Oral Daily  . multivitamin with minerals  1 tablet Oral Daily  . omega-3 acid ethyl esters  1 g Oral Daily  . pantoprazole  40 mg Oral QHS  . polyethylene glycol  17 g Oral BID  . sodium chloride  3 mL Intravenous Q12H  . sodium phosphate  1 enema Rectal Once  . verapamil  240 mg Oral QHS   Continuous Infusions:   PRN Meds:.sodium chloride, acetaminophen **OR** acetaminophen, alum & mag hydroxide-simeth, HYDROmorphone (DILAUDID) injection, ondansetron **OR**  ondansetron (ZOFRAN) IV, oxyCODONE, sodium chloride   Antibiotics   Anti-infectives    Start     Dose/Rate Route Frequency Ordered Stop   01/05/15 1100  levofloxacin (LEVAQUIN) tablet 750 mg     750 mg Oral Daily 01/05/15 1028     01/04/15 1200  levofloxacin (LEVAQUIN) IVPB 750 mg  Status:  Discontinued     750 mg 100 mL/hr over 90 Minutes Intravenous Every 24 hours 01/04/15 1111 01/05/15 1028        Subjective:   Lynnsey Barbara was seen and examined today. Denies any specific complaints. Ate good breakfast, son at bedside. No fevers or chills. Patient denies dizziness, chest pain, shortness of breath, abdominal pain, N/V/D/C. afebrile.  Objective:   Blood pressure 147/74, pulse 77, temperature 98.1 F (36.7 C), temperature source Oral, resp. rate 18, height '5\' 6"'  (1.676 m), weight 47.7 kg (105 lb 2.6 oz), SpO2 94 %.  Wt Readings from Last 3 Encounters:  01/03/15 47.7 kg (105 lb 2.6 oz)  02/03/14 51.166 kg (112 lb 12.8 oz)  11/21/13 51.256 kg (113 lb)     Intake/Output Summary (Last 24 hours) at 01/09/15 1344 Last data filed at 01/09/15 1125  Gross per 24 hour  Intake    400 ml  Output    500 ml  Net   -100 ml    Exam  General: Alert and oriented x 3, NAD  HEENT:  PERRLA, EOMI  Neck: Supple, no JVD, no masses  CVS: S1 S2 clear, RRR  Respiratory: CTAB  Abdomen: Soft, nontender, nondistended, + bowel sounds  Ext: no cyanosis clubbing or edema  Neuro: no new weakness  Skin: No rashes  Psych: Normal affect and demeanor, alert and oriented x3    Data Review   Micro Results Recent Results (from the past 240 hour(s))  Urine culture     Status: None   Collection Time: 01/03/15  8:10 PM  Result Value Ref Range Status   Specimen Description URINE, CLEAN CATCH  Final   Special Requests NONE  Final   Culture MULTIPLE SPECIES PRESENT, SUGGEST RECOLLECTION  Final   Report Status 01/05/2015 FINAL  Final    Radiology Reports Dg Chest 2 View  01/03/2015    CLINICAL DATA:  Initial evaluation for acute right-sided weakness.  EXAM: CHEST  2 VIEW  COMPARISON:  None.  FINDINGS: Transverse heart size at the upper limits of normal. Mediastinal silhouette within normal limits. Atheromatous plaque present within the aortic arch.  Lungs are mildly hyperinflated with attenuation of the pulmonary markings, consistent with emphysema. Minimal left basilar atelectasis/scarring. Similarly, probable parenchymal scarring within the peripheral right lower lobe. No  focal infiltrate, pulmonary edema, or pleural effusion. No pneumothorax. There is a 15 mm nodular and somewhat lobulated density overlying the right upper lobe, indeterminate.  Osteopenia noted.  No acute osseus abnormality.  IMPRESSION: 1. Emphysema.  No superimposed active cardiopulmonary disease. 2. 14 mm nodular density within the right upper lobe. Follow-up examination with cross-sectional imaging of the chest is recommended for further evaluation.   Electronically Signed   By: Jeannine Boga M.D.   On: 01/03/2015 21:39   Ct Head Wo Contrast  01/03/2015   CLINICAL DATA:  Right-sided hemiparesis.  EXAM: CT HEAD WITHOUT CONTRAST  TECHNIQUE: Contiguous axial images were obtained from the base of the skull through the vertex without intravenous contrast.  COMPARISON:  None.  FINDINGS: 2.5 x 2.1 cm mass is noted in the left centrum semiovale with surrounding white matter edema. Also noted is 2.3 x 1.4 cm mass in right cerebellar hemisphere with surrounding white matter edema. These findings most consistent with metastatic disease. Ventricular size is within normal limits. No significant midline shift is seen at this time. No definite hemorrhage is noted. Multiple small lucencies are noted in the bony calvarium which potentially may represent metastatic disease.  IMPRESSION: Masses with surrounding white matter edema are noted in the left parietal lobe and right cerebellar hemisphere most consistent with metastatic  disease. Also noted are multiple small lucencies in the bony calvarium which may represent metastatic disease. MRI with and without gadolinium is recommended for further evaluation.   Electronically Signed   By: Marijo Conception, M.D.   On: 01/03/2015 19:40   Ct Chest W Contrast  01/04/2015   ADDENDUM REPORT: 01/04/2015 10:31  ADDENDUM: There is atherosclerotic change in aorta but no aneurysm. No thoracic aortic aneurysm or dissection. No pulmonary embolus appreciable. There are scattered foci of coronary artery calcification.   Electronically Signed   By: Lowella Grip III M.D.   On: 01/04/2015 10:31   01/04/2015   CLINICAL DATA:  Pulmonary nodular lesion on chest radiograph  EXAM: CT CHEST WITH CONTRAST  TECHNIQUE: Multidetector CT imaging of the chest was performed during intravenous contrast administration.  CONTRAST:  81m OMNIPAQUE IOHEXOL 300 MG/ML  SOLN  COMPARISON:  Chest radiograph January 03, 2015  FINDINGS: There is underlying centrilobular emphysematous change. There is an irregular opacity with mild adjacent peribronchial thickening in the right apex measuring 1.4 x 0.6 cm, best seen on axial slice 9 series 2101 There is a lobular appearing nodular lesion in the posterior segment of the right upper lobe measuring 1.7 x 1.3 cm, best seen on axial slice 20 series 2751 There is a lobular appearing nodule also in the posterior segment of the right upper lobe measuring 1.2 x 1.2 cm, best seen on axial slice 26 series 2025 There is a nodular lesion abutting the minor fissure in the posterior segment left upper lobe on slice 30 series 2852measuring 0.9 x 0.9 cm. There is a nodular lesion in the superior segment of the right lower lobe seen on axial slice 36 series 2778measuring 1.2 x 1.2 cm. There is an irregular nodular lesion in the superior segment of the right lower lobe measuring 1.2 x 1.1 cm on axial slice 38 series 2242 There is an irregular spiculated lesion in the lateral segment of the  right middle lobe measuring 2.8 x 2.3 cm. This lesion is best seen on axial slice 40 series 2353 There is an area of consolidation in the posterior segment of the right lower  lobe. There are scattered areas of presumed scarring in the lungs bilaterally.  There is a nodular lesion in the right lobe of the thyroid measuring 1.7 x 0.7 cm. There is a nodular lesion arising from the isthmus of the thyroid extending toward the left measuring 2.5 x 1.0 cm.  There are prominent right hilar lymph nodes. The largest individual lymph node measures 1.7 x 1.1 cm. A second right hilar lymph node measures 1.3 x 1.2 cm. No other adenopathy is appreciable.  There is left ventricular hypertrophy. There is calcification in multiple coronary artery regions. Pericardium is not thickened.  Visualized upper abdomen, the adrenals appear unremarkable. There is atherosclerotic change in aorta. There is equivocal hepatic steatosis.  There are no blastic or lytic bone lesions. There is degenerative change in the thoracic spine with areas of endplate concavity at several levels, likely due to underlying osteoporosis.  IMPRESSION: Underlying emphysema. Multiple pulmonary nodular lesions, largest in the right middle lobe measuring 2.8 x 2.3 cm. Multifocal neoplasm is of concern. Advise PET-CT to further evaluate.  Consolidation right base posteriorly. This appearance by CT is more consistent with pneumonia than neoplasm, although a followup study in approximately 4 weeks to further evaluate this area may well be warranted.  Right hilar adenopathy.  Thyroid nodular lesions as noted above. Advise thyroid ultrasound to further evaluate.  These results will be called to the ordering clinician or representative by the Radiologist Assistant, and communication documented in the PACS or zVision Dashboard.  Electronically Signed: By: Lowella Grip III M.D. On: 01/04/2015 10:26   Mr Jeri Cos NO Contrast  01/05/2015   CLINICAL DATA:  Right-sided hemi  paresis. Unintentional weight loss. Brain masses on CT. Evidence of metastatic disease.  EXAM: MRI HEAD WITHOUT AND WITH CONTRAST  TECHNIQUE: Multiplanar, multiecho pulse sequences of the brain and surrounding structures were obtained without and with intravenous contrast.  CONTRAST:  41m MULTIHANCE GADOBENATE DIMEGLUMINE 529 MG/ML IV SOLN  COMPARISON:  Head CT 01/03/2015  FINDINGS: There is no evidence of acute infarct, midline shift, or extra-axial fluid collection. There is mild to moderate generalized cerebral atrophy. Patchy periventricular and subcortical white matter T2 hyperintensities are nonspecific but compatible with mild-to-moderate chronic small vessel ischemic disease.  Brain masses described on the recent CT demonstrate solid enhancement and measure 3.4 x 2.5 cm in the inferior right cerebellar hemisphere and 2.8 x 2.1 cm in the posterior left frontal lobe, each with mild surrounding vasogenic edema. Both lesions demonstrate areas of susceptibility artifact consistent with chronic blood products. A separate focus of chronic microhemorrhage is noted slightly more anteriorly in the right cerebellum without associated enhancement. There is a prominent vessel which courses into the superior aspect of the left frontal lesion. No other enhancing brain lesions are identified.  The bone marrow signal of the skull is diffusely heterogeneous on precontrast T1 weighted images and demonstrates diffusely heterogeneous enhancement after contrast administration. No destructive skull lesion is identified, however there are scattered subcentimeter foci of discrete enhancement. No dural-based mass is seen, although the right cerebellar mass does extend towards the dural surface.  Orbits are unremarkable. Paranasal sinuses and mastoid air cells are clear. Major intracranial vascular flow voids are preserved.  IMPRESSION: 1. Two enhancing brain masses, consistent with metastases. Mild surrounding edema without  significant mass effect. 2. Diffusely heterogeneous bone marrow in the skull, nonspecific however metastatic disease is a consideration.   Electronically Signed   By: ALogan BoresM.D.   On: 01/05/2015 13:37  Nm Bone Scan Whole Body  01/05/2015   CLINICAL DATA:  Brain metastases, as well as lucent lesions in the calvarium concerning for bone mets  EXAM: NUCLEAR MEDICINE WHOLE BODY BONE SCAN  TECHNIQUE: Whole body anterior and posterior images were obtained approximately 3 hours after intravenous injection of radiopharmaceutical.  RADIOPHARMACEUTICALS:  25 mCi Technetium-57mMDP IV  COMPARISON:  Multiple studies performed september 2016.  FINDINGS: Multiple foci of increased uptake involving the bilateral ribs. These appear to correspond to numerous rib fractures visible on the 01/04/15 CT scan. Lesion left femoral neck and trochanters consistent with fracture seen on same CT scan. Focus of uptake distal left forearm. Minimally heterogenous uptake in the calvarium without definite focal metastatic lesion there.  Degenerative uptake in the knees, right worse than left. Degenerative uptake in the ankles and probably degenerative uptake in the bilateral hindfeet.Degenerative uptake bilateral AC joints.  IMPRESSION: Lesions bilateral ribs and left femur consistent with fractures visible on recent CT scan.  Uptake distal left forearm:  Forearm radiographs recommended.  Minimally heterogenous uptake in the calvarium without definitive metastasis in this area.   Electronically Signed   By: RSkipper ClicheM.D.   On: 01/05/2015 17:00   Ct Abdomen Pelvis W Contrast  01/05/2015   CLINICAL DATA:  [age]year old female with concern for malignancy  EXAM: CT ABDOMEN AND PELVIS WITH CONTRAST  TECHNIQUE: Multidetector CT imaging of the abdomen and pelvis was performed using the standard protocol following bolus administration of intravenous contrast.  CONTRAST:  872mOMNIPAQUE IOHEXOL 300 MG/ML  SOLN  COMPARISON:  Chest CT dated  01/04/2015  FINDINGS: Evaluation is limited due to respiratory motion artifact.  The 4.6 x 2.8 cm pleural based masslike opacity is noted at the right lung base posteriorly.  No intra-abdominal free air or free fluid identified.  The liver appears unremarkable. There are multiple stones within the gallbladder. No pericholecystic fluid or evidence of gallbladder inflammation. The pancreas appears unremarkable. There is slight prominence of the head of the pancreas with extension into the duodenal C-loop. No discrete lesion identified. There is no atrophy of the gland or duct dilatation. The spleen appears unremarkable. A splenule is noted. There is apparent thickening of the left adrenal gland on the coronal view 58 be related to an underlying is normal. The right adrenal gland is unremarkable.  There is lobulated appearance of the renal cortices bilaterally. There is a 2.4 x 2.1 cm hypoenhancing lesion in the superior pole of the left kidney. Ultrasound is recommended for further characterization. Subcentimeter scattered bilateral renal hypodense lesions are too small to characterize. There is no hydronephrosis on either side. The visualized ureters and urinary bladder appear unremarkable. Hysterectomy.  There is sigmoid diverticulosis with muscular hypertrophy. No active inflammation. Moderate stool noted throughout the colon. There no evidence of bowel obstruction or inflammation.  Advanced aortoiliac atherosclerotic disease. The origins of the celiac axis, SMA, IMA as well as the origins of the renal arteries are patent. No portal venous gas identified. There is no lymphadenopathy.  Osteopenia with degenerative changes of the spine. There is compression deformity of the superior endplate of the [id number], age indeterminate, likely chronic. Clinical correlation is recommended. There is fracture of the greater trochanter of the left femur fossa on the prior CT dated 10/23/2014. Old left posterior rib fractures  noted. No new fracture identified. Small scattered lucencies throughout the lumbar spine may be related to osteopenia or malignancy such as multiple myeloma or metastatic disease.  IMPRESSION: Partially visualized  right lung base subpleural consolidation/mass as seen on the prior chest CT.  Left renal upper pole hypodense lesion. Ultrasound is recommended for further initial evaluation. MRI may be related for additional characterization depending on the ultrasound findings.  Cholelithiasis.  Sigmoid diverticulosis. No evidence of bowel obstruction or inflammation.  Osteopenia with fracture of the greater trochanter of the left femur as well as old left posterior rib fractures. Small lucencies throughout the lumbar vertebra may be related to osteopenia or represent multiple myeloma/ metastatic disease. Bone scan may provide better evaluation if clinically indicated.   Electronically Signed   By: Anner Crete M.D.   On: 01/05/2015 03:46   US Renal  01/05/2015   CLINICAL DATA:  Hypertension.  Indeterminate left renal mass on CT.  EXAM: RENAL / URINARY TRACT ULTRASOUND COMPLETE  COMPARISON:  CT of 01/04/2015.  FINDINGS: Right Kidney:  Length: 9.7 cm. No hydronephrosis. Normal renal cortical thickness and echogenicity.  Left Kidney:  Length: 9.8 cm. No hydronephrosis. Normal renal cortical thickness and echogenicity. Tiny left renal cysts identified. The upper pole dominant left renal lesion described on prior CT is not readily identified or well evaluated.  Bladder:  Appears normal for degree of bladder distention.  IMPRESSION: 1. The upper pole left renal indeterminate lesion on CT is not well evaluated or visualized. Recommend nonemergent outpatient pre and post contrast abdominal MRI. 2.  No acute findings.   Electronically Signed   By: Abigail Miyamoto M.D.   On: 01/05/2015 20:42   Dg Chest Port 1 View  01/08/2015   CLINICAL DATA:  Status post bronchoscopy with biopsy.  EXAM: PORTABLE CHEST - 1 VIEW   COMPARISON:  January 03, 2015.  FINDINGS: The heart size and mediastinal contours are within normal limits. No pneumothorax or significant pleural effusion is noted. Stable nodular density is noted in right upper lobe concerning for possible neoplasm. Old right rib fracture is noted. Left lung is clear.  IMPRESSION: No pneumothorax or significant pleural effusion is noted. Stable nodular density seen in right upper lobe concerning for possible neoplasm.   Electronically Signed   By: Marijo Conception, M.D.   On: 01/08/2015 11:38   Dg C-arm Bronchoscopy  01/08/2015   CLINICAL DATA:    C-ARM BRONCHOSCOPY  Fluoroscopy was utilized by the requesting physician.  No radiographic  interpretation.     CBC  Recent Labs Lab 01/03/15 1819 01/03/15 1841 01/04/15 0440 01/06/15 0524 01/07/15 0550 01/08/15 0748  WBC 9.0  --  6.0 10.1 7.3 5.5  HGB 15.0 16.3* 13.9 12.9 13.2 13.0  HCT 42.9 48.0* 41.2 38.3 39.9 39.7  PLT 380  --  353 364 373 339  MCV 92.9  --  93.4 95.3 94.8 95.0  MCH 32.5  --  31.5 32.1 31.4 31.1  MCHC 35.0  --  33.7 33.7 33.1 32.7  RDW 13.6  --  13.7 14.3 14.2 14.0  LYMPHSABS 1.8  --   --   --   --   --   MONOABS 0.8  --   --   --   --   --   EOSABS 0.1  --   --   --   --   --   BASOSABS 0.0  --   --   --   --   --     Chemistries   Recent Labs Lab 01/03/15 1819  01/04/15 0440 01/05/15 0534 01/06/15 0524 01/07/15 0550 01/08/15 0748  NA 138  < > 139 137  135 137 138  K 3.3*  < > 3.0* 4.5 3.9 4.3 4.2  CL 103  < > 104 105 100* 101 104  CO2 23  --  '22 24 25 27 28  ' GLUCOSE 101*  < > 106* 139* 134* 124* 112*  BUN 9  < > '6 11 16 19 19  ' CREATININE 0.79  < > 0.76 0.73 0.79 0.80 0.68  CALCIUM 9.6  --  9.1 9.6 9.3 9.4 9.3  AST 23  --   --   --   --   --   --   ALT 13*  --   --   --   --   --   --   ALKPHOS 95  --   --   --   --   --   --   BILITOT 0.7  --   --   --   --   --   --   < > = values in this interval not  displayed. ------------------------------------------------------------------------------------------------------------------ estimated creatinine clearance is 52.8 mL/min (by C-G formula based on Cr of 0.68). ------------------------------------------------------------------------------------------------------------------ No results for input(s): HGBA1C in the last 72 hours. ------------------------------------------------------------------------------------------------------------------ No results for input(s): CHOL, HDL, LDLCALC, TRIG, CHOLHDL, LDLDIRECT in the last 72 hours. ------------------------------------------------------------------------------------------------------------------ No results for input(s): TSH, T4TOTAL, T3FREE, THYROIDAB in the last 72 hours.  Invalid input(s): FREET3 ------------------------------------------------------------------------------------------------------------------ No results for input(s): VITAMINB12, FOLATE, FERRITIN, TIBC, IRON, RETICCTPCT in the last 72 hours.  Coagulation profile  Recent Labs Lab 01/03/15 1819  INR 0.98    No results for input(s): DDIMER in the last 72 hours.  Cardiac Enzymes No results for input(s): CKMB, TROPONINI, MYOGLOBIN in the last 168 hours.  Invalid input(s): CK ------------------------------------------------------------------------------------------------------------------ Invalid input(s): [place]  01/08/15 0817 01/08/15 1224 01/08/15 1733 01/08/15 2105 01/09/15 0751 01/09/15 1128  GLUCAP 108* 119* 205* 131* 114* 168*     RAI,RIPUDEEP M.D. [facility] 01/09/2015, 1:44 PM  Pager: [phone] Between 7am to 7pm - call Pager - [phone]  After 7pm go to [email or web address] - password [email or web address]  Call night coverage person covering after 7pm

## 2015-01-10 LAB — GLUCOSE, CAPILLARY
GLUCOSE-CAPILLARY: 95 mg/dL (ref 65–99)
GLUCOSE-CAPILLARY: 103 mg/dL — AB (ref 65–99)
GLUCOSE-CAPILLARY: 130 mg/dL — AB (ref 65–99)
Glucose-Capillary: 164 mg/dL — ABNORMAL HIGH (ref 65–99)

## 2015-01-10 MED ORDER — BISACODYL 10 MG RE SUPP
10.0000 mg | Freq: Every day | RECTAL | Status: DC
  Filled 2015-01-10 (×2): qty 1

## 2015-01-10 NOTE — Progress Notes (Signed)
[facility]                                                                              Patient Demographics  Kristin Griffin, is a 64 y.o. female, DOB - February 22, 1951, GBE:[id number]  Admit date - 01/03/2015   Admitting Physician Theressa Millard, MD  Outpatient Primary MD for the patient is Delman Cheadle, MD  LOS - 7   Chief Complaint  Patient presents with  . Weakness       Brief HPI   Kristin Griffin is a 65 y.o. female with a history of HTN who presents to the ED with complaints of Right sided Weakness and progressive decline for the past 3 weeks.The patient had presented with unintentional weight loss of 10 pounds in the past 5 weeks, no fevers or chills. Patient's brothers notice that she was walking and dragging her right leg and brought her to the ED. CT head showed 2.3 into 1.4 cm mass in the right cerebellar hemisphere, 2.5 and 2.1 cm mass in the left centrum semiovale with white matter edema. It was followed by a CT chest with contrast to evaluate for a primary. CT chest showed multiple pulmonary nodules. Patient was referred to admission.  Assessment & Plan    Principal Problem:   Brain mass, right lung mass, bony lucencies in the lumbar spine, renal lesion, underlying smoking history one pack per day for 45-50 years - CT head showed 2.3 into 1.4 cm mass in the right cerebellar hemisphere, 2.5 and 2.1 cm mass in the left centrum semiovale with white matter edema.  - Patient started on IV Decadron - Neurosurgery, Onc consulted. Rad Onc consulted.. Patient underwent lung biopsy on 9/16, suspecting adenocarcinoma.  - final biopsy pending  Right lung mass - Patient has multiple lung masses, including spiculated lesion in the right middle lobe 2.8X2.3 centimeter, underlying emphysema, multifocal neoplasm also of concern - pulm, Onc consulted, Dr Earlie Server and radiation oncology following - Patient was placed on levofloxacin for possible postobstructive  pneumonia as per CT chest - biopsy done 9/16, suspecting adenoca  Left renal upper pole lesion, lumbar vertebra lesions - Renal ultrasound upper left renal lesion not visualized, recommend nonemergent MRI   Protein-calorie malnutrition, severe with underlying possible malignancy - Nutrition consult, continue nutritional supplements  Right-sided weakness likely due to the brain masses and edema - Continue physical therapy, IV Decadron  Bone lesions: Bone scan: Lesions bilateral ribs and left femur consistent with fractures visible on recent CT scan. No mets   Hypertension Currently stable, continue verapamil  Code Status: Full code  Family Communication: Discussed in detail with the patient, all imaging results, lab results explained to the patient    Disposition Plan: Not medically ready, awaiting biopsy results and further management from other specialists. Social worker consult placed for skilled nursing facility placement for rehabilitation.    Time Spent in minutes to 15 minutes  Procedures  CT head CT chest, CT abdomen and pelvis  Consults   Pulm IR Neurosurgery oncology   DVT Prophylaxis  heparin   Medications  Scheduled Meds: . bisacodyl  10 mg Rectal Daily  .  calcium carbonate  1 tablet Oral Q breakfast  . dexamethasone  4 mg Intravenous 4 times per day  . docusate sodium  100 mg Oral BID  . feeding supplement (ENSURE ENLIVE)  237 mL Oral BID BM  . heparin subcutaneous  5,000 Units Subcutaneous 3 times per day  . insulin aspart  0-15 Units Subcutaneous TID WC  . insulin aspart  3 Units Subcutaneous TID WC  . levofloxacin  750 mg Oral Daily  . multivitamin with minerals  1 tablet Oral Daily  . omega-3 acid ethyl esters  1 g Oral Daily  . pantoprazole  40 mg Oral QHS  . polyethylene glycol  17 g Oral BID  . sodium chloride  3 mL Intravenous Q12H  . verapamil  240 mg Oral QHS   Continuous Infusions:   PRN Meds:.sodium chloride, acetaminophen **OR**  acetaminophen, alum & mag hydroxide-simeth, HYDROmorphone (DILAUDID) injection, ondansetron **OR** ondansetron (ZOFRAN) IV, oxyCODONE, sodium chloride   Antibiotics   Anti-infectives    Start     Dose/Rate Route Frequency Ordered Stop   01/05/15 1100  levofloxacin (LEVAQUIN) tablet 750 mg     750 mg Oral Daily 01/05/15 1028     01/04/15 1200  levofloxacin (LEVAQUIN) IVPB 750 mg  Status:  Discontinued     750 mg 100 mL/hr over 90 Minutes Intravenous Every 24 hours 01/04/15 1111 01/05/15 1028        Subjective:   Kristin Griffin was seen and examined today.  no complaints per patient except intermittent headache. No focal weakness. No fevers or chills. Patient denies dizziness, chest pain, shortness of breath, abdominal pain, N/V/D/C. afebrile.  Objective:   Blood pressure 158/79, pulse 81, temperature 98.4 F (36.9 C), temperature source Oral, resp. rate 16, height '5\' 6"'  (1.676 m), weight 47.7 kg (105 lb 2.6 oz), SpO2 93 %.  Wt Readings from Last 3 Encounters:  01/03/15 47.7 kg (105 lb 2.6 oz)  02/03/14 51.166 kg (112 lb 12.8 oz)  11/21/13 51.256 kg (113 lb)     Intake/Output Summary (Last 24 hours) at 01/10/15 1225 Last data filed at 01/10/15 0932  Gross per 24 hour  Intake    440 ml  Output    800 ml  Net   -360 ml    Exam  General: Alert and oriented x 3, NAD  HEENT:  PERRLA, EOMI  Neck: Supple, no JVD, no masses  CVS: S1 S2 clear, RRR  Respiratory: CTAB  Abdomen: Soft, nontender, nondistended, + bowel sounds  Ext: no cyanosis clubbing or edema  Neuro: no new weakness  Skin: No rashes  Psych: Normal affect and demeanor, alert and oriented x3    Data Review   Micro Results Recent Results (from the past 240 hour(s))  Urine culture     Status: None   Collection Time: 01/03/15  8:10 PM  Result Value Ref Range Status   Specimen Description URINE, CLEAN CATCH  Final   Special Requests NONE  Final   Culture MULTIPLE SPECIES PRESENT, SUGGEST  RECOLLECTION  Final   Report Status 01/05/2015 FINAL  Final    Radiology Reports Dg Chest 2 View  01/03/2015   CLINICAL DATA:  Initial evaluation for acute right-sided weakness.  EXAM: CHEST  2 VIEW  COMPARISON:  None.  FINDINGS: Transverse heart size at the upper limits of normal. Mediastinal silhouette within normal limits. Atheromatous plaque present within the aortic arch.  Lungs are mildly hyperinflated with attenuation of the pulmonary markings, consistent with emphysema. Minimal left  basilar atelectasis/scarring. Similarly, probable parenchymal scarring within the peripheral right lower lobe. No focal infiltrate, pulmonary edema, or pleural effusion. No pneumothorax. There is a 15 mm nodular and somewhat lobulated density overlying the right upper lobe, indeterminate.  Osteopenia noted.  No acute osseus abnormality.  IMPRESSION: 1. Emphysema.  No superimposed active cardiopulmonary disease. 2. 14 mm nodular density within the right upper lobe. Follow-up examination with cross-sectional imaging of the chest is recommended for further evaluation.   Electronically Signed   By: Jeannine Boga M.D.   On: 01/03/2015 21:39   Ct Head Wo Contrast  01/03/2015   CLINICAL DATA:  Right-sided hemiparesis.  EXAM: CT HEAD WITHOUT CONTRAST  TECHNIQUE: Contiguous axial images were obtained from the base of the skull through the vertex without intravenous contrast.  COMPARISON:  None.  FINDINGS: 2.5 x 2.1 cm mass is noted in the left centrum semiovale with surrounding white matter edema. Also noted is 2.3 x 1.4 cm mass in right cerebellar hemisphere with surrounding white matter edema. These findings most consistent with metastatic disease. Ventricular size is within normal limits. No significant midline shift is seen at this time. No definite hemorrhage is noted. Multiple small lucencies are noted in the bony calvarium which potentially may represent metastatic disease.  IMPRESSION: Masses with surrounding white  matter edema are noted in the left parietal lobe and right cerebellar hemisphere most consistent with metastatic disease. Also noted are multiple small lucencies in the bony calvarium which may represent metastatic disease. MRI with and without gadolinium is recommended for further evaluation.   Electronically Signed   By: Marijo Conception, M.D.   On: 01/03/2015 19:40   Ct Chest W Contrast  01/04/2015   ADDENDUM REPORT: 01/04/2015 10:31  ADDENDUM: There is atherosclerotic change in aorta but no aneurysm. No thoracic aortic aneurysm or dissection. No pulmonary embolus appreciable. There are scattered foci of coronary artery calcification.   Electronically Signed   By: Lowella Grip III M.D.   On: 01/04/2015 10:31   01/04/2015   CLINICAL DATA:  Pulmonary nodular lesion on chest radiograph  EXAM: CT CHEST WITH CONTRAST  TECHNIQUE: Multidetector CT imaging of the chest was performed during intravenous contrast administration.  CONTRAST:  18m OMNIPAQUE IOHEXOL 300 MG/ML  SOLN  COMPARISON:  Chest radiograph January 03, 2015  FINDINGS: There is underlying centrilobular emphysematous change. There is an irregular opacity with mild adjacent peribronchial thickening in the right apex measuring 1.4 x 0.6 cm, best seen on axial slice 9 series 2409 There is a lobular appearing nodular lesion in the posterior segment of the right upper lobe measuring 1.7 x 1.3 cm, best seen on axial slice 20 series 2735 There is a lobular appearing nodule also in the posterior segment of the right upper lobe measuring 1.2 x 1.2 cm, best seen on axial slice 26 series 2329 There is a nodular lesion abutting the minor fissure in the posterior segment left upper lobe on slice 30 series 2924measuring 0.9 x 0.9 cm. There is a nodular lesion in the superior segment of the right lower lobe seen on axial slice 36 series 2268measuring 1.2 x 1.2 cm. There is an irregular nodular lesion in the superior segment of the right lower lobe measuring 1.2  x 1.1 cm on axial slice 38 series 2341 There is an irregular spiculated lesion in the lateral segment of the right middle lobe measuring 2.8 x 2.3 cm. This lesion is best seen on axial slice 40 series 2962 There  is an area of consolidation in the posterior segment of the right lower lobe. There are scattered areas of presumed scarring in the lungs bilaterally.  There is a nodular lesion in the right lobe of the thyroid measuring 1.7 x 0.7 cm. There is a nodular lesion arising from the isthmus of the thyroid extending toward the left measuring 2.5 x 1.0 cm.  There are prominent right hilar lymph nodes. The largest individual lymph node measures 1.7 x 1.1 cm. A second right hilar lymph node measures 1.3 x 1.2 cm. No other adenopathy is appreciable.  There is left ventricular hypertrophy. There is calcification in multiple coronary artery regions. Pericardium is not thickened.  Visualized upper abdomen, the adrenals appear unremarkable. There is atherosclerotic change in aorta. There is equivocal hepatic steatosis.  There are no blastic or lytic bone lesions. There is degenerative change in the thoracic spine with areas of endplate concavity at several levels, likely due to underlying osteoporosis.  IMPRESSION: Underlying emphysema. Multiple pulmonary nodular lesions, largest in the right middle lobe measuring 2.8 x 2.3 cm. Multifocal neoplasm is of concern. Advise PET-CT to further evaluate.  Consolidation right base posteriorly. This appearance by CT is more consistent with pneumonia than neoplasm, although a followup study in approximately 4 weeks to further evaluate this area may well be warranted.  Right hilar adenopathy.  Thyroid nodular lesions as noted above. Advise thyroid ultrasound to further evaluate.  These results will be called to the ordering clinician or representative by the Radiologist Assistant, and communication documented in the PACS or zVision Dashboard.  Electronically Signed: By: Lowella Grip III M.D. On: 01/04/2015 10:26   Mr Jeri Cos OA Contrast  01/05/2015   CLINICAL DATA:  Right-sided hemi paresis. Unintentional weight loss. Brain masses on CT. Evidence of metastatic disease.  EXAM: MRI HEAD WITHOUT AND WITH CONTRAST  TECHNIQUE: Multiplanar, multiecho pulse sequences of the brain and surrounding structures were obtained without and with intravenous contrast.  CONTRAST:  76m MULTIHANCE GADOBENATE DIMEGLUMINE 529 MG/ML IV SOLN  COMPARISON:  Head CT 01/03/2015  FINDINGS: There is no evidence of acute infarct, midline shift, or extra-axial fluid collection. There is mild to moderate generalized cerebral atrophy. Patchy periventricular and subcortical white matter T2 hyperintensities are nonspecific but compatible with mild-to-moderate chronic small vessel ischemic disease.  Brain masses described on the recent CT demonstrate solid enhancement and measure 3.4 x 2.5 cm in the inferior right cerebellar hemisphere and 2.8 x 2.1 cm in the posterior left frontal lobe, each with mild surrounding vasogenic edema. Both lesions demonstrate areas of susceptibility artifact consistent with chronic blood products. A separate focus of chronic microhemorrhage is noted slightly more anteriorly in the right cerebellum without associated enhancement. There is a prominent vessel which courses into the superior aspect of the left frontal lesion. No other enhancing brain lesions are identified.  The bone marrow signal of the skull is diffusely heterogeneous on precontrast T1 weighted images and demonstrates diffusely heterogeneous enhancement after contrast administration. No destructive skull lesion is identified, however there are scattered subcentimeter foci of discrete enhancement. No dural-based mass is seen, although the right cerebellar mass does extend towards the dural surface.  Orbits are unremarkable. Paranasal sinuses and mastoid air cells are clear. Major intracranial vascular flow voids are  preserved.  IMPRESSION: 1. Two enhancing brain masses, consistent with metastases. Mild surrounding edema without significant mass effect. 2. Diffusely heterogeneous bone marrow in the skull, nonspecific however metastatic disease is a consideration.   Electronically Signed  By: Logan Bores M.D.   On: 01/05/2015 13:37   Nm Bone Scan Whole Body  01/05/2015   CLINICAL DATA:  Brain metastases, as well as lucent lesions in the calvarium concerning for bone mets  EXAM: NUCLEAR MEDICINE WHOLE BODY BONE SCAN  TECHNIQUE: Whole body anterior and posterior images were obtained approximately 3 hours after intravenous injection of radiopharmaceutical.  RADIOPHARMACEUTICALS:  25 mCi Technetium-41mMDP IV  COMPARISON:  Multiple studies performed september 2016.  FINDINGS: Multiple foci of increased uptake involving the bilateral ribs. These appear to correspond to numerous rib fractures visible on the 01/04/15 CT scan. Lesion left femoral neck and trochanters consistent with fracture seen on same CT scan. Focus of uptake distal left forearm. Minimally heterogenous uptake in the calvarium without definite focal metastatic lesion there.  Degenerative uptake in the knees, right worse than left. Degenerative uptake in the ankles and probably degenerative uptake in the bilateral hindfeet.Degenerative uptake bilateral AC joints.  IMPRESSION: Lesions bilateral ribs and left femur consistent with fractures visible on recent CT scan.  Uptake distal left forearm:  Forearm radiographs recommended.  Minimally heterogenous uptake in the calvarium without definitive metastasis in this area.   Electronically Signed   By: RSkipper ClicheM.D.   On: 01/05/2015 17:00   Ct Abdomen Pelvis W Contrast  01/05/2015   CLINICAL DATA:  64year old female with concern for malignancy  EXAM: CT ABDOMEN AND PELVIS WITH CONTRAST  TECHNIQUE: Multidetector CT imaging of the abdomen and pelvis was performed using the standard protocol following bolus  administration of intravenous contrast.  CONTRAST:  87mOMNIPAQUE IOHEXOL 300 MG/ML  SOLN  COMPARISON:  Chest CT dated 01/04/2015  FINDINGS: Evaluation is limited due to respiratory motion artifact.  The 4.6 x 2.8 cm pleural based masslike opacity is noted at the right lung base posteriorly.  No intra-abdominal free air or free fluid identified.  The liver appears unremarkable. There are multiple stones within the gallbladder. No pericholecystic fluid or evidence of gallbladder inflammation. The pancreas appears unremarkable. There is slight prominence of the head of the pancreas with extension into the duodenal C-loop. No discrete lesion identified. There is no atrophy of the gland or duct dilatation. The spleen appears unremarkable. A splenule is noted. There is apparent thickening of the left adrenal gland on the coronal view 58 be related to an underlying is normal. The right adrenal gland is unremarkable.  There is lobulated appearance of the renal cortices bilaterally. There is a 2.4 x 2.1 cm hypoenhancing lesion in the superior pole of the left kidney. Ultrasound is recommended for further characterization. Subcentimeter scattered bilateral renal hypodense lesions are too small to characterize. There is no hydronephrosis on either side. The visualized ureters and urinary bladder appear unremarkable. Hysterectomy.  There is sigmoid diverticulosis with muscular hypertrophy. No active inflammation. Moderate stool noted throughout the colon. There no evidence of bowel obstruction or inflammation.  Advanced aortoiliac atherosclerotic disease. The origins of the celiac axis, SMA, IMA as well as the origins of the renal arteries are patent. No portal venous gas identified. There is no lymphadenopathy.  Osteopenia with degenerative changes of the spine. There is compression deformity of the superior endplate of the [id number], age indeterminate, likely chronic. Clinical correlation is recommended. There is  fracture of the greater trochanter of the left femur fossa on the prior CT dated 10/23/2014. Old left posterior rib fractures noted. No new fracture identified. Small scattered lucencies throughout the lumbar spine may be related to osteopenia or  malignancy such as multiple myeloma or metastatic disease.  IMPRESSION: Partially visualized right lung base subpleural consolidation/mass as seen on the prior chest CT.  Left renal upper pole hypodense lesion. Ultrasound is recommended for further initial evaluation. MRI may be related for additional characterization depending on the ultrasound findings.  Cholelithiasis.  Sigmoid diverticulosis. No evidence of bowel obstruction or inflammation.  Osteopenia with fracture of the greater trochanter of the left femur as well as old left posterior rib fractures. Small lucencies throughout the lumbar vertebra may be related to osteopenia or represent multiple myeloma/ metastatic disease. Bone scan may provide better evaluation if clinically indicated.   Electronically Signed   By: Anner Crete M.D.   On: 01/05/2015 03:46   US Renal  01/05/2015   CLINICAL DATA:  Hypertension.  Indeterminate left renal mass on CT.  EXAM: RENAL / URINARY TRACT ULTRASOUND COMPLETE  COMPARISON:  CT of 01/04/2015.  FINDINGS: Right Kidney:  Length: 9.7 cm. No hydronephrosis. Normal renal cortical thickness and echogenicity.  Left Kidney:  Length: 9.8 cm. No hydronephrosis. Normal renal cortical thickness and echogenicity. Tiny left renal cysts identified. The upper pole dominant left renal lesion described on prior CT is not readily identified or well evaluated.  Bladder:  Appears normal for degree of bladder distention.  IMPRESSION: 1. The upper pole left renal indeterminate lesion on CT is not well evaluated or visualized. Recommend nonemergent outpatient pre and post contrast abdominal MRI. 2.  No acute findings.   Electronically Signed   By: Abigail Miyamoto M.D.   On: 01/05/2015 20:42   Dg  Chest Port 1 View  01/08/2015   CLINICAL DATA:  Status post bronchoscopy with biopsy.  EXAM: PORTABLE CHEST - 1 VIEW  COMPARISON:  January 03, 2015.  FINDINGS: The heart size and mediastinal contours are within normal limits. No pneumothorax or significant pleural effusion is noted. Stable nodular density is noted in right upper lobe concerning for possible neoplasm. Old right rib fracture is noted. Left lung is clear.  IMPRESSION: No pneumothorax or significant pleural effusion is noted. Stable nodular density seen in right upper lobe concerning for possible neoplasm.   Electronically Signed   By: Marijo Conception, M.D.   On: 01/08/2015 11:38   Dg C-arm Bronchoscopy  01/08/2015   CLINICAL DATA:    C-ARM BRONCHOSCOPY  Fluoroscopy was utilized by the requesting physician.  No radiographic  interpretation.     CBC  Recent Labs Lab 01/03/15 1819 01/03/15 1841 01/04/15 0440 01/06/15 0524 01/07/15 0550 01/08/15 0748  WBC 9.0  --  6.0 10.1 7.3 5.5  HGB 15.0 16.3* 13.9 12.9 13.2 13.0  HCT 42.9 48.0* 41.2 38.3 39.9 39.7  PLT 380  --  353 364 373 339  MCV 92.9  --  93.4 95.3 94.8 95.0  MCH 32.5  --  31.5 32.1 31.4 31.1  MCHC 35.0  --  33.7 33.7 33.1 32.7  RDW 13.6  --  13.7 14.3 14.2 14.0  LYMPHSABS 1.8  --   --   --   --   --   MONOABS 0.8  --   --   --   --   --   EOSABS 0.1  --   --   --   --   --   BASOSABS 0.0  --   --   --   --   --     Chemistries   Recent Labs Lab 01/03/15 1819  01/04/15 0440 01/05/15 0534 01/06/15 0524  01/07/15 0550 01/08/15 0748  NA 138  < > 139 137 135 137 138  K 3.3*  < > 3.0* 4.5 3.9 4.3 4.2  CL 103  < > 104 105 100* 101 104  CO2 23  --  '22 24 25 27 28  ' GLUCOSE 101*  < > 106* 139* 134* 124* 112*  BUN 9  < > '6 11 16 19 19  ' CREATININE 0.79  < > 0.76 0.73 0.79 0.80 0.68  CALCIUM 9.6  --  9.1 9.6 9.3 9.4 9.3  AST 23  --   --   --   --   --   --   ALT 13*  --   --   --   --   --   --   ALKPHOS 95  --   --   --   --   --   --   BILITOT 0.7  --   --    --   --   --   --   < > = values in this interval not displayed. ------------------------------------------------------------------------------------------------------------------ estimated creatinine clearance is 52.8 mL/min (by C-G formula based on Cr of 0.68). ------------------------------------------------------------------------------------------------------------------ No results for input(s): HGBA1C in the last 72 hours. ------------------------------------------------------------------------------------------------------------------ No results for input(s): CHOL, HDL, LDLCALC, TRIG, CHOLHDL, LDLDIRECT in the last 72 hours. ------------------------------------------------------------------------------------------------------------------ No results for input(s): TSH, T4TOTAL, T3FREE, THYROIDAB in the last 72 hours.  Invalid input(s): FREET3 ------------------------------------------------------------------------------------------------------------------ No results for input(s): VITAMINB12, FOLATE, FERRITIN, TIBC, IRON, RETICCTPCT in the last 72 hours.  Coagulation profile  Recent Labs Lab 01/03/15 1819  INR 0.98    No results for input(s): DDIMER in the last 72 hours.  Cardiac Enzymes No results for input(s): CKMB, TROPONINI, MYOGLOBIN in the last 168 hours.  Invalid input(s): CK ------------------------------------------------------------------------------------------------------------------ Invalid input(s): [place]  01/09/15 0751 01/09/15 1128 01/09/15 1635 01/09/15 2146 01/10/15 0800 01/10/15 1134  GLUCAP 114* 168* 142* 109* 95 164*     RAI,RIPUDEEP M.D. [facility] 01/10/2015, 12:25 PM  Pager: [phone] Between 7am to 7pm - call Pager - [phone]  After 7pm go to [email or web address] - password [email or web address]  Call night coverage person covering after 7pm

## 2015-01-11 ENCOUNTER — Encounter (HOSPITAL_COMMUNITY): Payer: Self-pay | Admitting: Emergency Medicine

## 2015-01-11 ENCOUNTER — Other Ambulatory Visit: Payer: Self-pay | Admitting: Internal Medicine

## 2015-01-11 DIAGNOSIS — C7931 Secondary malignant neoplasm of brain: Secondary | ICD-10-CM

## 2015-01-11 DIAGNOSIS — C3431 Malignant neoplasm of lower lobe, right bronchus or lung: Secondary | ICD-10-CM

## 2015-01-11 DIAGNOSIS — C3430 Malignant neoplasm of lower lobe, unspecified bronchus or lung: Secondary | ICD-10-CM | POA: Insufficient documentation

## 2015-01-11 DIAGNOSIS — R918 Other nonspecific abnormal finding of lung field: Secondary | ICD-10-CM

## 2015-01-11 DIAGNOSIS — M899 Disorder of bone, unspecified: Secondary | ICD-10-CM

## 2015-01-11 LAB — GLUCOSE, CAPILLARY
GLUCOSE-CAPILLARY: 80 mg/dL (ref 65–99)
Glucose-Capillary: 116 mg/dL — ABNORMAL HIGH (ref 65–99)
Glucose-Capillary: 226 mg/dL — ABNORMAL HIGH (ref 65–99)
Glucose-Capillary: 117 mg/dL — ABNORMAL HIGH (ref 65–99)
Glucose-Capillary: 119 mg/dL — ABNORMAL HIGH (ref 65–99)

## 2015-01-11 NOTE — Clinical Social Work Note (Signed)
Clinical Social Worker continuing to follow patient and family for support and discharge planning needs. Patient continues to request inpatient rehab as primary choice but is open to SNF pending insurance denials or bed availability. Patient with list of available beds. Per MD, patient not medically ready at this time. CSW remains available for support and to facilitate patient discharge needs once medically stable.  Barbette Or, [place]

## 2015-01-11 NOTE — Progress Notes (Signed)
Thank you for consult on Kristin Griffin. Chart reviewed and note that patient is doing well but will continue to requires supervision after a short rehab stay due to cognitive issues. She lives alone and continue to recommend SNF to allow patient adequate time to return home at independent level.

## 2015-01-11 NOTE — Progress Notes (Signed)
   01/11/15 1559  Clinical Encounter Type  Visited With Patient and family together;Patient not available  Visit Type Initial  Referral From Nurse   Chaplain attempted to meet with patient, and patient was unavailable. Chaplain will follow-up and our support is available as needed.   Jeri Lager, Chaplain 01/11/2015 4:00 PM

## 2015-01-11 NOTE — Progress Notes (Signed)
Physical Therapy Treatment Patient Details Name: Kristin Griffin MRN: [id number] DOB: [birth year] Today's Date: 01/11/2015    History of Present Illness 64 y.o. female presents with complaints of Right sided Weakness and progressive decline for the past 3 weeks.  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    PT Comments    Make steady progress.  Follow Up Recommendations  SNF (CIR recommending SNF)     Equipment Recommendations  Rolling walker with 5" wheels    Recommendations for Other Services Rehab consult     Precautions / Restrictions Precautions Precautions: Fall Restrictions Weight Bearing Restrictions: No    Mobility  Bed Mobility Overal bed mobility: Needs Assistance Bed Mobility: Supine to Sit     Supine to sit: Min guard     General bed mobility comments: Verbal cues to bring rt leg off of bed  Transfers Overall transfer level: Needs assistance Equipment used: Rolling walker (2 wheeled) Transfers: Sit to/from Stand Sit to Stand: Min guard         General transfer comment: Assist for balance  Ambulation/Gait Ambulation/Gait assistance: Min assist Ambulation Distance (Feet): 250 Feet Assistive device: Rolling walker (2 wheeled) Gait Pattern/deviations: Step-through pattern;Decreased step length - right Gait velocity: decreased Gait velocity interpretation: Below normal speed for age/gender General Gait Details: Pt clearing rt foot and stepping past lt foot about 90% of time. Occasionally needs verbal cues to step through with rt foot. On turns pt requires cues to bring rt foot through.   Stairs            Wheelchair Mobility    Modified Rankin (Stroke Patients Only) Modified Rankin (Stroke Patients Only) Pre-Morbid Rankin  Score: No symptoms Modified Rankin: Moderately severe disability     Balance   Sitting-balance support: No upper extremity supported;Feet supported Sitting balance-Leahy Scale: Fair     Standing balance support: Single extremity supported Standing balance-Leahy Scale: Poor Standing balance comment: Upper extremitiy support.                    Cognition Arousal/Alertness: Awake/alert Behavior During Therapy: WFL for tasks assessed/performed Overall Cognitive Status: Within Functional Limits for tasks assessed Area of Impairment: Attention   Current Attention Level: Selective     Safety/Judgement: Decreased awareness of safety Awareness: Anticipatory Problem Solving: Requires verbal cues General Comments: Continues to show improvements. Pt was easily distracted during OT session and education on use of theraputty.     Exercises      General Comments        Pertinent Vitals/Pain Pain Assessment: No/denies pain    Home Living                      Prior Function            PT Goals (current goals can now be found in the care plan section) Acute Rehab PT Goals PT Goal Formulation: With patient/family Time For Goal Achievement: 01/19/15 Potential to Achieve Goals: Good Progress towards PT goals: Progressing toward goals    Frequency  Min 3X/week    PT Plan Discharge plan needs to be updated    Co-evaluation             End of Session Equipment Utilized During Treatment: Gait belt  Activity Tolerance: Patient tolerated treatment well Patient left: in chair;with call bell/phone within reach;with family/visitor present;with chair alarm set     Time: 1209-1226 PT Time Calculation (min) (ACUTE ONLY): 17 min  Charges:  $Gait Training: 8-22 mins                    G Codes:      MAYCOCK,CARY 2015/02/10, 2:43 PM [facility] PT [phone]

## 2015-01-11 NOTE — Progress Notes (Signed)
[facility]                                                                              Patient Demographics  Kristin Griffin, is a 64 y.o. female, DOB - Jun 05, 1950, IWL:[id number]  Admit date - 01/03/2015   Admitting Physician Theressa Millard, MD  Outpatient Primary MD for the patient is Delman Cheadle, MD  LOS - 8   Chief Complaint  Patient presents with  . Weakness       Brief HPI   Kristin Griffin is a 64 y.o. female with a history of HTN who presents to the ED with complaints of Right sided Weakness and progressive decline for the past 3 weeks.The patient had presented with unintentional weight loss of 10 pounds in the past 5 weeks, no fevers or chills. Patient's brothers notice that she was walking and dragging her right leg and brought her to the ED. CT head showed 2.3 into 1.4 cm mass in the right cerebellar hemisphere, 2.5 and 2.1 cm mass in the left centrum semiovale with white matter edema. It was followed by a CT chest with contrast to evaluate for a primary. CT chest showed multiple pulmonary nodules. Patient was referred to admission.  Assessment & Plan    Principal Problem:   Brain mass, right lung mass, bony lucencies in the lumbar spine, renal lesion, underlying smoking history one pack per day for 45-50 years - CT head showed 2.3 into 1.4 cm mass in the right cerebellar hemisphere, 2.5 and 2.1 cm mass in the left centrum semiovale with white matter edema.  - Patient started on IV Decadron. Neurosurgery consulted, did not recommend resection, patient needs radiation. Radiation oncology has been consulted, awaiting XRT planning inpt vs outpt.  - Patient underwent lung biopsy on 9/16, suspecting adenocarcinoma, final biopsy pending  Right lung mass - Patient has multiple lung masses, including spiculated lesion in the right middle lobe 2.8X2.3 centimeter, underlying emphysema, multifocal neoplasm also of concern - Discussed with Dr. Julien Nordmann, will  follow today, for further management and plan - Discontinued levofloxacin, received 8 days. biopsy done 9/16, suspecting adenoca  Left renal upper pole lesion, lumbar vertebra lesions - Renal ultrasound upper left renal lesion not visualized, recommend nonemergent MRI   Protein-calorie malnutrition, severe with underlying possible malignancy - Nutrition consult, continue nutritional supplements  Right-sided weakness likely due to the brain masses and edema - Continue physical therapy, IV Decadron  Bone lesions: Bone scan: Lesions bilateral ribs and left femur consistent with fractures visible on recent CT scan. No mets   Hypertension Currently stable, continue verapamil  Code Status: Full code  Family Communication: Discussed in detail with the patient, all imaging results, lab results explained to the patient and family member at the bedside.   Disposition Plan: Not medically ready, awaiting biopsy results and further management from other specialists. Social worker consult placed for skilled nursing facility placement for rehabilitation.    Time Spent in minutes to 15 minutes  Procedures  CT head CT chest, CT abdomen and pelvis  Consults   Pulm IR Neurosurgery oncology   DVT Prophylaxis  heparin   Medications  Scheduled Meds: . bisacodyl  10 mg Rectal Daily  . calcium carbonate  1 tablet Oral Q breakfast  . dexamethasone  4 mg Intravenous 4 times per day  . docusate sodium  100 mg Oral BID  . feeding supplement (ENSURE ENLIVE)  237 mL Oral BID BM  . heparin subcutaneous  5,000 Units Subcutaneous 3 times per day  . insulin aspart  0-15 Units Subcutaneous TID WC  . insulin aspart  3 Units Subcutaneous TID WC  . multivitamin with minerals  1 tablet Oral Daily  . omega-3 acid ethyl esters  1 g Oral Daily  . pantoprazole  40 mg Oral QHS  . polyethylene glycol  17 g Oral BID  . sodium chloride  3 mL Intravenous Q12H  . verapamil  240 mg Oral QHS   Continuous  Infusions:   PRN Meds:.sodium chloride, acetaminophen **OR** acetaminophen, alum & mag hydroxide-simeth, HYDROmorphone (DILAUDID) injection, ondansetron **OR** ondansetron (ZOFRAN) IV, oxyCODONE, sodium chloride   Antibiotics   Anti-infectives    Start     Dose/Rate Route Frequency Ordered Stop   01/05/15 1100  levofloxacin (LEVAQUIN) tablet 750 mg  Status:  Discontinued     750 mg Oral Daily 01/05/15 1028 01/10/15 1424   01/04/15 1200  levofloxacin (LEVAQUIN) IVPB 750 mg  Status:  Discontinued     750 mg 100 mL/hr over 90 Minutes Intravenous Every 24 hours 01/04/15 1111 01/05/15 1028        Subjective:   Kristin Griffin was seen and examined today. No complaints, no focal weakness. No fevers or chills. Patient denies dizziness, chest pain, shortness of breath, abdominal pain, N/V/D/C. afebrile.  Objective:   Blood pressure 151/71, pulse 81, temperature 98.7 F (37.1 C), temperature source Oral, resp. rate 16, height '5\' 6"'  (1.676 m), weight 47.7 kg (105 lb 2.6 oz), SpO2 92 %.  Wt Readings from Last 3 Encounters:  01/03/15 47.7 kg (105 lb 2.6 oz)  02/03/14 51.166 kg (112 lb 12.8 oz)  11/21/13 51.256 kg (113 lb)     Intake/Output Summary (Last 24 hours) at 01/11/15 1053 Last data filed at 01/11/15 1005  Gross per 24 hour  Intake    200 ml  Output   1501 ml  Net  -1301 ml    Exam  General: Alert and oriented x 3, NAD  HEENT:  PERRLA, EOMI  Neck: Supple, no JVD, no masses  CVS: S1 S2 clear, RRR  Respiratory: CTAB  Abdomen: Soft,NT, ND, NBS  Ext: no cyanosis clubbing or edema  Neuro: no new weakness  Skin: No rashes  Psych: Normal affect and demeanor, alert and oriented x3    Data Review   Micro Results Recent Results (from the past 240 hour(s))  Urine culture     Status: None   Collection Time: 01/03/15  8:10 PM  Result Value Ref Range Status   Specimen Description URINE, CLEAN CATCH  Final   Special Requests NONE  Final   Culture MULTIPLE SPECIES  PRESENT, SUGGEST RECOLLECTION  Final   Report Status 01/05/2015 FINAL  Final    Radiology Reports Dg Chest 2 View  01/03/2015   CLINICAL DATA:  Initial evaluation for acute right-sided weakness.  EXAM: CHEST  2 VIEW  COMPARISON:  None.  FINDINGS: Transverse heart size at the upper limits of normal. Mediastinal silhouette within normal limits. Atheromatous plaque present within the aortic arch.  Lungs are mildly hyperinflated with attenuation of the pulmonary markings, consistent with emphysema. Minimal left basilar atelectasis/scarring. Similarly, probable  parenchymal scarring within the peripheral right lower lobe. No focal infiltrate, pulmonary edema, or pleural effusion. No pneumothorax. There is a 15 mm nodular and somewhat lobulated density overlying the right upper lobe, indeterminate.  Osteopenia noted.  No acute osseus abnormality.  IMPRESSION: 1. Emphysema.  No superimposed active cardiopulmonary disease. 2. 14 mm nodular density within the right upper lobe. Follow-up examination with cross-sectional imaging of the chest is recommended for further evaluation.   Electronically Signed   By: Jeannine Boga M.D.   On: 01/03/2015 21:39   Ct Head Wo Contrast  01/03/2015   CLINICAL DATA:  Right-sided hemiparesis.  EXAM: CT HEAD WITHOUT CONTRAST  TECHNIQUE: Contiguous axial images were obtained from the base of the skull through the vertex without intravenous contrast.  COMPARISON:  None.  FINDINGS: 2.5 x 2.1 cm mass is noted in the left centrum semiovale with surrounding white matter edema. Also noted is 2.3 x 1.4 cm mass in right cerebellar hemisphere with surrounding white matter edema. These findings most consistent with metastatic disease. Ventricular size is within normal limits. No significant midline shift is seen at this time. No definite hemorrhage is noted. Multiple small lucencies are noted in the bony calvarium which potentially may represent metastatic disease.  IMPRESSION: Masses with  surrounding white matter edema are noted in the left parietal lobe and right cerebellar hemisphere most consistent with metastatic disease. Also noted are multiple small lucencies in the bony calvarium which may represent metastatic disease. MRI with and without gadolinium is recommended for further evaluation.   Electronically Signed   By: Marijo Conception, M.D.   On: 01/03/2015 19:40   Ct Chest W Contrast  01/04/2015   ADDENDUM REPORT: 01/04/2015 10:31  ADDENDUM: There is atherosclerotic change in aorta but no aneurysm. No thoracic aortic aneurysm or dissection. No pulmonary embolus appreciable. There are scattered foci of coronary artery calcification.   Electronically Signed   By: Lowella Grip III M.D.   On: 01/04/2015 10:31   01/04/2015   CLINICAL DATA:  Pulmonary nodular lesion on chest radiograph  EXAM: CT CHEST WITH CONTRAST  TECHNIQUE: Multidetector CT imaging of the chest was performed during intravenous contrast administration.  CONTRAST:  36m OMNIPAQUE IOHEXOL 300 MG/ML  SOLN  COMPARISON:  Chest radiograph January 03, 2015  FINDINGS: There is underlying centrilobular emphysematous change. There is an irregular opacity with mild adjacent peribronchial thickening in the right apex measuring 1.4 x 0.6 cm, best seen on axial slice 9 series 2932 There is a lobular appearing nodular lesion in the posterior segment of the right upper lobe measuring 1.7 x 1.3 cm, best seen on axial slice 20 series 2671 There is a lobular appearing nodule also in the posterior segment of the right upper lobe measuring 1.2 x 1.2 cm, best seen on axial slice 26 series 2245 There is a nodular lesion abutting the minor fissure in the posterior segment left upper lobe on slice 30 series 2809measuring 0.9 x 0.9 cm. There is a nodular lesion in the superior segment of the right lower lobe seen on axial slice 36 series 2983measuring 1.2 x 1.2 cm. There is an irregular nodular lesion in the superior segment of the right lower  lobe measuring 1.2 x 1.1 cm on axial slice 38 series 2382 There is an irregular spiculated lesion in the lateral segment of the right middle lobe measuring 2.8 x 2.3 cm. This lesion is best seen on axial slice 40 series 2505 There is an area of  consolidation in the posterior segment of the right lower lobe. There are scattered areas of presumed scarring in the lungs bilaterally.  There is a nodular lesion in the right lobe of the thyroid measuring 1.7 x 0.7 cm. There is a nodular lesion arising from the isthmus of the thyroid extending toward the left measuring 2.5 x 1.0 cm.  There are prominent right hilar lymph nodes. The largest individual lymph node measures 1.7 x 1.1 cm. A second right hilar lymph node measures 1.3 x 1.2 cm. No other adenopathy is appreciable.  There is left ventricular hypertrophy. There is calcification in multiple coronary artery regions. Pericardium is not thickened.  Visualized upper abdomen, the adrenals appear unremarkable. There is atherosclerotic change in aorta. There is equivocal hepatic steatosis.  There are no blastic or lytic bone lesions. There is degenerative change in the thoracic spine with areas of endplate concavity at several levels, likely due to underlying osteoporosis.  IMPRESSION: Underlying emphysema. Multiple pulmonary nodular lesions, largest in the right middle lobe measuring 2.8 x 2.3 cm. Multifocal neoplasm is of concern. Advise PET-CT to further evaluate.  Consolidation right base posteriorly. This appearance by CT is more consistent with pneumonia than neoplasm, although a followup study in approximately 4 weeks to further evaluate this area may well be warranted.  Right hilar adenopathy.  Thyroid nodular lesions as noted above. Advise thyroid ultrasound to further evaluate.  These results will be called to the ordering clinician or representative by the Radiologist Assistant, and communication documented in the PACS or zVision Dashboard.  Electronically Signed:  By: Lowella Grip III M.D. On: 01/04/2015 10:26   Mr Jeri Cos NW Contrast  01/05/2015   CLINICAL DATA:  Right-sided hemi paresis. Unintentional weight loss. Brain masses on CT. Evidence of metastatic disease.  EXAM: MRI HEAD WITHOUT AND WITH CONTRAST  TECHNIQUE: Multiplanar, multiecho pulse sequences of the brain and surrounding structures were obtained without and with intravenous contrast.  CONTRAST:  32m MULTIHANCE GADOBENATE DIMEGLUMINE 529 MG/ML IV SOLN  COMPARISON:  Head CT 01/03/2015  FINDINGS: There is no evidence of acute infarct, midline shift, or extra-axial fluid collection. There is mild to moderate generalized cerebral atrophy. Patchy periventricular and subcortical white matter T2 hyperintensities are nonspecific but compatible with mild-to-moderate chronic small vessel ischemic disease.  Brain masses described on the recent CT demonstrate solid enhancement and measure 3.4 x 2.5 cm in the inferior right cerebellar hemisphere and 2.8 x 2.1 cm in the posterior left frontal lobe, each with mild surrounding vasogenic edema. Both lesions demonstrate areas of susceptibility artifact consistent with chronic blood products. A separate focus of chronic microhemorrhage is noted slightly more anteriorly in the right cerebellum without associated enhancement. There is a prominent vessel which courses into the superior aspect of the left frontal lesion. No other enhancing brain lesions are identified.  The bone marrow signal of the skull is diffusely heterogeneous on precontrast T1 weighted images and demonstrates diffusely heterogeneous enhancement after contrast administration. No destructive skull lesion is identified, however there are scattered subcentimeter foci of discrete enhancement. No dural-based mass is seen, although the right cerebellar mass does extend towards the dural surface.  Orbits are unremarkable. Paranasal sinuses and mastoid air cells are clear. Major intracranial vascular flow voids  are preserved.  IMPRESSION: 1. Two enhancing brain masses, consistent with metastases. Mild surrounding edema without significant mass effect. 2. Diffusely heterogeneous bone marrow in the skull, nonspecific however metastatic disease is a consideration.   Electronically Signed   By: AZenia Resides  Jeralyn Ruths M.D.   On: 01/05/2015 13:37   Nm Bone Scan Whole Body  01/05/2015   CLINICAL DATA:  Brain metastases, as well as lucent lesions in the calvarium concerning for bone mets  EXAM: NUCLEAR MEDICINE WHOLE BODY BONE SCAN  TECHNIQUE: Whole body anterior and posterior images were obtained approximately 3 hours after intravenous injection of radiopharmaceutical.  RADIOPHARMACEUTICALS:  25 mCi Technetium-80mMDP IV  COMPARISON:  Multiple studies performed september 2016.  FINDINGS: Multiple foci of increased uptake involving the bilateral ribs. These appear to correspond to numerous rib fractures visible on the 01/04/15 CT scan. Lesion left femoral neck and trochanters consistent with fracture seen on same CT scan. Focus of uptake distal left forearm. Minimally heterogenous uptake in the calvarium without definite focal metastatic lesion there.  Degenerative uptake in the knees, right worse than left. Degenerative uptake in the ankles and probably degenerative uptake in the bilateral hindfeet.Degenerative uptake bilateral AC joints.  IMPRESSION: Lesions bilateral ribs and left femur consistent with fractures visible on recent CT scan.  Uptake distal left forearm:  Forearm radiographs recommended.  Minimally heterogenous uptake in the calvarium without definitive metastasis in this area.   Electronically Signed   By: RSkipper ClicheM.D.   On: 01/05/2015 17:00   Ct Abdomen Pelvis W Contrast  01/05/2015   CLINICAL DATA:  64year old female with concern for malignancy  EXAM: CT ABDOMEN AND PELVIS WITH CONTRAST  TECHNIQUE: Multidetector CT imaging of the abdomen and pelvis was performed using the standard protocol following bolus  administration of intravenous contrast.  CONTRAST:  833mOMNIPAQUE IOHEXOL 300 MG/ML  SOLN  COMPARISON:  Chest CT dated 01/04/2015  FINDINGS: Evaluation is limited due to respiratory motion artifact.  The 4.6 x 2.8 cm pleural based masslike opacity is noted at the right lung base posteriorly.  No intra-abdominal free air or free fluid identified.  The liver appears unremarkable. There are multiple stones within the gallbladder. No pericholecystic fluid or evidence of gallbladder inflammation. The pancreas appears unremarkable. There is slight prominence of the head of the pancreas with extension into the duodenal C-loop. No discrete lesion identified. There is no atrophy of the gland or duct dilatation. The spleen appears unremarkable. A splenule is noted. There is apparent thickening of the left adrenal gland on the coronal view 58 be related to an underlying is normal. The right adrenal gland is unremarkable.  There is lobulated appearance of the renal cortices bilaterally. There is a 2.4 x 2.1 cm hypoenhancing lesion in the superior pole of the left kidney. Ultrasound is recommended for further characterization. Subcentimeter scattered bilateral renal hypodense lesions are too small to characterize. There is no hydronephrosis on either side. The visualized ureters and urinary bladder appear unremarkable. Hysterectomy.  There is sigmoid diverticulosis with muscular hypertrophy. No active inflammation. Moderate stool noted throughout the colon. There no evidence of bowel obstruction or inflammation.  Advanced aortoiliac atherosclerotic disease. The origins of the celiac axis, SMA, IMA as well as the origins of the renal arteries are patent. No portal venous gas identified. There is no lymphadenopathy.  Osteopenia with degenerative changes of the spine. There is compression deformity of the superior endplate of the T1I10ertebra, age indeterminate, likely chronic. Clinical correlation is recommended. There is  fracture of the greater trochanter of the left femur fossa on the prior CT dated 10/23/2014. Old left posterior rib fractures noted. No new fracture identified. Small scattered lucencies throughout the lumbar spine may be related to osteopenia or malignancy such as  multiple myeloma or metastatic disease.  IMPRESSION: Partially visualized right lung base subpleural consolidation/mass as seen on the prior chest CT.  Left renal upper pole hypodense lesion. Ultrasound is recommended for further initial evaluation. MRI may be related for additional characterization depending on the ultrasound findings.  Cholelithiasis.  Sigmoid diverticulosis. No evidence of bowel obstruction or inflammation.  Osteopenia with fracture of the greater trochanter of the left femur as well as old left posterior rib fractures. Small lucencies throughout the lumbar vertebra may be related to osteopenia or represent multiple myeloma/ metastatic disease. Bone scan may provide better evaluation if clinically indicated.   Electronically Signed   By: Anner Crete M.D.   On: 01/05/2015 03:46   US Renal  01/05/2015   CLINICAL DATA:  Hypertension.  Indeterminate left renal mass on CT.  EXAM: RENAL / URINARY TRACT ULTRASOUND COMPLETE  COMPARISON:  CT of 01/04/2015.  FINDINGS: Right Kidney:  Length: 9.7 cm. No hydronephrosis. Normal renal cortical thickness and echogenicity.  Left Kidney:  Length: 9.8 cm. No hydronephrosis. Normal renal cortical thickness and echogenicity. Tiny left renal cysts identified. The upper pole dominant left renal lesion described on prior CT is not readily identified or well evaluated.  Bladder:  Appears normal for degree of bladder distention.  IMPRESSION: 1. The upper pole left renal indeterminate lesion on CT is not well evaluated or visualized. Recommend nonemergent outpatient pre and post contrast abdominal MRI. 2.  No acute findings.   Electronically Signed   By: Abigail Miyamoto M.D.   On: 01/05/2015 20:42   Dg  Chest Port 1 View  01/08/2015   CLINICAL DATA:  Status post bronchoscopy with biopsy.  EXAM: PORTABLE CHEST - 1 VIEW  COMPARISON:  January 03, 2015.  FINDINGS: The heart size and mediastinal contours are within normal limits. No pneumothorax or significant pleural effusion is noted. Stable nodular density is noted in right upper lobe concerning for possible neoplasm. Old right rib fracture is noted. Left lung is clear.  IMPRESSION: No pneumothorax or significant pleural effusion is noted. Stable nodular density seen in right upper lobe concerning for possible neoplasm.   Electronically Signed   By: Marijo Conception, M.D.   On: 01/08/2015 11:38   Dg C-arm Bronchoscopy  01/08/2015   CLINICAL DATA:    C-ARM BRONCHOSCOPY  Fluoroscopy was utilized by the requesting physician.  No radiographic  interpretation.     CBC  Recent Labs Lab 01/06/15 0524 01/07/15 0550 01/08/15 0748  WBC 10.1 7.3 5.5  HGB 12.9 13.2 13.0  HCT 38.3 39.9 39.7  PLT 364 373 339  MCV 95.3 94.8 95.0  MCH 32.1 31.4 31.1  MCHC 33.7 33.1 32.7  RDW 14.3 14.2 14.0    Chemistries   Recent Labs Lab 01/05/15 0534 01/06/15 0524 01/07/15 0550 01/08/15 0748  NA 137 135 137 138  K 4.5 3.9 4.3 4.2  CL 105 100* 101 104  CO2 '24 25 27 28  ' GLUCOSE 139* 134* 124* 112*  BUN '11 16 19 19  ' CREATININE 0.73 0.79 0.80 0.68  CALCIUM 9.6 9.3 9.4 9.3   ------------------------------------------------------------------------------------------------------------------ estimated creatinine clearance is 52.8 mL/min (by C-G formula based on Cr of 0.68). ------------------------------------------------------------------------------------------------------------------ No results for input(s): HGBA1C in the last 72 hours. ------------------------------------------------------------------------------------------------------------------ No results for input(s): CHOL, HDL, LDLCALC, TRIG, CHOLHDL, LDLDIRECT in the last 72  hours. ------------------------------------------------------------------------------------------------------------------ No results for input(s): TSH, T4TOTAL, T3FREE, THYROIDAB in the last 72 hours.  Invalid input(s): FREET3 ------------------------------------------------------------------------------------------------------------------ No results for input(s): VITAMINB12, FOLATE,  FERRITIN, TIBC, IRON, RETICCTPCT in the last 72 hours.  Coagulation profile No results for input(s): INR, PROTIME in the last 168 hours.  No results for input(s): DDIMER in the last 72 hours.  Cardiac Enzymes No results for input(s): CKMB, TROPONINI, MYOGLOBIN in the last 168 hours.  Invalid input(s): CK ------------------------------------------------------------------------------------------------------------------ Invalid input(s): POCBNP   Recent Labs  01/10/15 0800 01/10/15 1134 01/10/15 1641 01/10/15 2108 01/11/15 0648 01/11/15 0810  GLUCAP 95 164* 103* 130* 116* 41     RAI,RIPUDEEP M.D. [facility] 01/11/2015, 10:53 AM  Pager: [phone] Between 7am to 7pm - call Pager - [phone]  After 7pm go to [email or web address] - password [email or web address]  Call night coverage person covering after 7pm

## 2015-01-11 NOTE — Progress Notes (Signed)
Occupational Therapy Treatment Patient Details Name: Kristin Griffin MRN: [id number] DOB: 1950-12-16 Today's Date: 01/11/2015    History of present illness 64 y.o. female presents with complaints of Right sided Weakness and progressive decline for the past 3 weeks.  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   OT comments  Patient progressing nicely towards goals. Pt continues to show some mild cognitive impairments and is easily distracted, especially with more traffic in/out of room when trying to learn new task. Worked with patient on self-feeding and educated patient on theraputty HEP for R hand, see below for more information.    Follow Up Recommendations  CIR;Supervision/Assistance - 24 hour    Equipment Recommendations  Other (comment) (TBD, defer to next venue)    Recommendations for Other Services Rehab consult   Precautions / Restrictions Precautions Precautions: Fall Restrictions Weight Bearing Restrictions: No    Mobility Bed Mobility General bed mobility comments: See PT note, pt found seated in recliner upon OT entering/exiting room  Transfers General transfer comment: Did not occur, see PT note        ADL Overall ADL's : Needs assistance/impaired General ADL Comments: Pt with decreased ADL independence due to decreased functional use/coordination with RUE/hand. Pt found seated in recliner starting to eat lunch. Took opportunity to work with patient on self-feeding. Pt easily distracted and would continously try to use LUE to self-feed. Continued to encourage use of RUE. Administerred red theraputty and educated pt on HEP to increase coordination > right hand. While educated patient on these exercises, pt easily distracted with people walking in/out of  room. Make putty into ball, then pancake, then volcano - make putty into ball again, then log, the squeeze down log with thumb & first finger, thumb & middle finger, thumb & ring finger, thumb & pinky.      Cognition   Behavior During Therapy: WFL for tasks assessed/performed Overall Cognitive Status: Within Functional Limits for tasks assessed Area of Impairment: Attention   Current Attention Level: Selective      Safety/Judgement: Decreased awareness of safety Awareness: Anticipatory Problem Solving: Requires verbal cues;Requires tactile cues General Comments: Continues to show improvements. Pt was easily distracted during OT session and education on use of theraputty.       Exercises Other Exercises Other Exercises: Theraputty exercises for R hand.  Other Exercises: ONLY using right hand, make putty into a ball, then a pancake, then a volcano Other Exercises: ONLY using right hand, make putting into a ball, then a log, then pinch down log with thumb & first finger, thumb & middle finger, thumb & ring finger, thumb & pinky           Pertinent Vitals/ Pain       Pain Assessment: No/denies pain         Frequency Min 2X/week     Progress Toward Goals  OT Goals(current goals can now be found in the care plan section)  Progress towards OT goals: Progressing toward goals     Plan Discharge plan remains appropriate          Activity Tolerance Patient tolerated treatment well   Patient Left in chair;with call bell/phone within reach     Time: 1243-1311 OT Time Calculation (min): 28 min  Charges: OT General  Charges $OT Visit: 1 Procedure OT Treatments $Self Care/Home Management : 8-22 mins $Therapeutic Activity: 8-22 mins  CLAY,PATRICIA , MS, OTR/L, CLT Pager: [phone]  01/11/2015, 2:10 PM

## 2015-01-11 NOTE — Progress Notes (Signed)
   Name: Kristin Griffin MRN: [id number] DOB: [birth year]     ADMISSION DATE:  01/03/2015 CONSULTATION DATE:  9/14   REFERRING MD :  Tana Coast (triad)   CHIEF COMPLAINT:  Lung mass   BRIEF PATIENT DESCRIPTION: 64yo female former smoker with hx HTN presented 9/11 with 3 week hx R sided weakness, weight loss.  In ER CT head revealed bilateral masses consistent with metastatic disease.  CT chest revealed multiple pulmonary nodules, largest in RML.  PCCM consulted to assist with tissue dx.   SIGNIFICANT EVENTS  9/16 - Nav Bronchoscopy w/ TBBx, TBNA & brushings RLL  STUDIES:  CT chest 9/13>>> multiple pulmonary nodular lesions, largest RML 2.8x2.3cm.   CT abd/pelvis 9/13>> CT head 9/11>>>2.5 x 2.1 cm mass is noted in the left centrum semiovale with surrounding white matter edema. Also noted is 2.3 x 1.4 cm mass in right cerebellar hemisphere with surrounding white matter edema. These findings most consistent with metastatic disease MRI brain 9/13>>>1. Two enhancing brain masses, consistent with metastases. Mild surrounding edema without significant mass effect. 2. Diffusely heterogeneous bone marrow in the skull, nonspecific however metastatic disease is a consideration.  SUBJECTIVE:  Patient underwent bronchoscopy under general anesthesia on 9/16. Denies any headache. Denies any chest pain or pressure. Denies any cough or dyspnea at present.  ROS:  Denies any subjective fever, chills, or sweats. No nausea or vomiting. No abdominal pain.   VITAL SIGNS: Temp:  [97.6 F (36.4 C)-98.7 F (37.1 C)] 98.7 F (37.1 C) (09/19 0502) Pulse Rate:  [81-102] 81 (09/19 0502) Resp:  [16-20] 16 (09/19 0502) BP: (139-162)/(71-90) 151/71 mmHg (09/19 0502) SpO2:  [92 %-96 %] 92 % (09/19 0502)  PHYSICAL EXAMINATION: General:  Awake. Alert. No acute distress. Breakfast tray at bedside having just eaten blueberry pancakes.  Integument:  Warm & dry. No rash on exposed skin. No bruising. HEENT:  Moist mucus  membranes. No oral ulcers. No scleral injection or icterus. Endotracheal tube in place. PERRL. Cardiovascular:  Regular rate. No edema. No appreciable JVD.  Pulmonary:  Good aeration & clear to auscultation bilaterally. Symmetric chest wall expansion. No accessory muscle use on room air. Abdomen: Soft. Normal bowel sounds. Grossly nontender. Neurological:  CN 2-12 grossly in tact. No meningismus. Moving all 4 extremities equally.    Recent Labs Lab 01/06/15 0524 01/07/15 0550 01/08/15 0748  NA 135 137 138  K 3.9 4.3 4.2  CL 100* 101 104  CO2 '25 27 28  '$ BUN '16 19 19  '$ CREATININE 0.79 0.80 0.68  GLUCOSE 134* 124* 112*    Recent Labs Lab 01/06/15 0524 01/07/15 0550 01/08/15 0748  HGB 12.9 13.2 13.0  HCT 38.3 39.9 39.7  WBC 10.1 7.3 5.5  PLT 364 373 339   No results found.  ASSESSMENT / PLAN: 64 year old female with multiple pulmonary nodules as well as brain metastases. Status post electromagnetic navigation guided bronchoscopy with transbronchial brushings & biopsies from the right lower lobe. Final pathology remains pending. The patient remains in good spirits with no complications post procedure.  1. Pulmonary nodules/probable lung cancer: Awaiting final pathology results from bronchoscopy on 9/16. Medical oncology following. 2. Bilateral brain metastases: Currently on Decadron IV every 6 hours. Neurosurgery consulted & did not recommend surgery. 3. H/O COPD: No signs of exacerbation.  Sonia Baller Ashok Cordia, M.D. [facility] Pager:  [phone] After 3pm or if no response, call [phone]  01/11/2015, 9:33 AM

## 2015-01-11 NOTE — Care Management Important Message (Signed)
Important Message  Patient Details  Name: Kristin Griffin MRN: [id number] Date of Birth: [birth year]   Medicare Important Message Given:  Yes-third notification given    [name] May 01/11/2015, 4:49 PM

## 2015-01-11 NOTE — Progress Notes (Signed)
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 Her case was discussed in multidisciplinary brain conference earlier this morning. Neurosurgery does not feel strongly that resection is necessary at this time. The patient appears to be a good candidate for radiosurgery to the 2 intracranial lesions.  The patient will require additional imaging including a simulation/treatment planning session in our department. Our staff will begin working on these arrangements as soon as possible. The patient also will be scheduled for radiosurgery in the near future, which can be completed as an outpatient. Based on the location of the cerebellar metastasis, I believe that a course of 3 fractions to this lesion would be appropriate.

## 2015-01-11 NOTE — Progress Notes (Signed)
Kristin Griffin   DOB:10-13-1950   UP#:[id number]   PFY#:[id number]  Subjective: Patient seen and examined. Feeling well this morning. She feels that her  Right sided strength is improved. Denies fevers, chills, night sweats, vision changes, or mucositis. Denies shortness of breath or cough. Denies any chest pain or palpitations. Denies lower extremity swelling. Denies nausea, heartburn or change in bowel habits. Appetite is normal. Denies any dysuria. Denies abnormal skin rashes, or neuropathy. Denies any bleeding issues such as epistaxis, hematemesis, hematuria or hematochezia. Ambulating with assistance.  Scheduled Meds: . bisacodyl  10 mg Rectal Daily  . calcium carbonate  1 tablet Oral Q breakfast  . dexamethasone  4 mg Intravenous 4 times per day  . docusate sodium  100 mg Oral BID  . feeding supplement (ENSURE ENLIVE)  237 mL Oral BID BM  . heparin subcutaneous  5,000 Units Subcutaneous 3 times per day  . insulin aspart  0-15 Units Subcutaneous TID WC  . insulin aspart  3 Units Subcutaneous TID WC  . multivitamin with minerals  1 tablet Oral Daily  . omega-3 acid ethyl esters  1 g Oral Daily  . pantoprazole  40 mg Oral QHS  . polyethylene glycol  17 g Oral BID  . sodium chloride  3 mL Intravenous Q12H  . verapamil  240 mg Oral QHS   Continuous Infusions:  PRN Meds:.sodium chloride, acetaminophen **OR** acetaminophen, alum & mag hydroxide-simeth, HYDROmorphone (DILAUDID) injection, ondansetron **OR** ondansetron (ZOFRAN) IV, oxyCODONE, sodium chloride  Objective:  Filed Vitals:   01/11/15 0502  BP: 151/71  Pulse: 81  Temp: 98.7 F (37.1 C)  Resp: 16    Body mass index is 16.98 kg/(m^2).  Intake/Output Summary (Last 24 hours) at 01/11/15 1023 Last data filed at 01/11/15 1005  Gross per 24 hour  Intake    200 ml  Output   1501 ml  Net  -1301 ml     Sclerae unicteric  Oropharynx clear  No peripheral adenopathy  Lungs clear -- no rales or rhonchi  Heart regular rate and  rhythm  Abdomen benign  MSK no focal spinal tenderness, no peripheral edema  Neuro nonfocal. Right sided weakness improved.    CBG (last 3)   Recent Labs  01/10/15 2108 01/11/15 0648 01/11/15 0810  GLUCAP 130* 116* 80      Labs:   Recent Labs Lab 01/06/15 0524 01/07/15 0550 01/08/15 0748  WBC 10.1 7.3 5.5  HGB 12.9 13.2 13.0  HCT 38.3 39.9 39.7  PLT 364 373 339  MCV 95.3 94.8 95.0  MCH 32.1 31.4 31.1  MCHC 33.7 33.1 32.7  RDW 14.3 14.2 14.0     Chemistries:    Recent Labs Lab 01/05/15 0534 01/06/15 0524 01/07/15 0550 01/08/15 0748  NA 137 135 137 138  K 4.5 3.9 4.3 4.2  CL 105 100* 101 104  CO2 '24 25 27 28  ' GLUCOSE 139* 134* 124* 112*  BUN '11 16 19 19  ' CREATININE 0.73 0.79 0.80 0.68  CALCIUM 9.6 9.3 9.4 9.3     Microbiology Recent Results (from the past 240 hour(s))  Urine culture     Status: None   Collection Time: 01/03/15  8:10 PM  Result Value Ref Range Status   Specimen Description URINE, CLEAN CATCH  Final   Special Requests NONE  Final   Culture MULTIPLE SPECIES PRESENT, SUGGEST RECOLLECTION  Final   Report Status 01/05/2015 FINAL  Final     Studies:  No results found.  Assessment / Plan:    Metastatic cancer At this point is of unknown primary, however it is suspected to be lung primary in the setting of heavy tobacco history CT of the head shows a 2.3 x 1.4 cm mass in the right setting of a large hemisphere, and a 2.5 x 2.1 cm mass in the left Centrum semiovale with white matter edema Multiple lung lesions including a spiculated lesion in the right middle lobe was 2.8 x 2.3 cm, was noted As mentioned above, she also has bone and left renal upper pole lesion MRI brain remarkable for a  3.4 x 2.5 cm in the inferior right cerebellar hemisphere and 2.8 x 2.1 cm in the posterior left frontal lobe, each with mild surrounding vasogenic edema. The bone marrow signal of the skull is diffusely heterogeneous  Bony lucencies in the  lumbar spine seen by Bone Scan on 9/13 Patient underwent bronchoscopy under general anesthesia on 9/16, results pending. Will follow Neurosurgery does not recommend resection At this time, continue IV Decadron to reduce edema, with close monitoring of her blood sugars,  Right-sided weakness Due to brain metastasis and cerebral edema Continue IV Decadron Appreciate PT-OT involvement  Tobacco habituation Tobacco cessation is recommended  Malnutrition In the setting of malignancy Appreciate nutrition evaluation and follow-up  Full Code Other medical issues as per admitting team     Kristin Griffin 01/11/2015  10:23 AM Medical Oncology and Hematology [facility]  Hematology/Oncology Attending: The patient is seen and examined today. I agree with the above note. This is a very pleasant 64 years old white female recently diagnosed with stage IV non-small cell lung cancer, adenocarcinoma with 2 large brain metastasis in addition to bone and left renal upper lobe lesion. The patient recently underwent bronchoscopy under the care of Dr. Lamonte Griffin and the final pathology was consistent with adenocarcinoma. I would request the tissue block to be sent for molecular biomarker studies including EGFR, ALK and PDL 1. The patient was evaluated by neurosurgery and they recommended against resection of her brain tumor. She was also seen by Dr. Lisbeth Griffin and he recommended palliative radiotherapy to the metastatic brain lesion. I don't see any appointment for the patient to follow-up with Dr. Lisbeth Griffin for this procedure.. I will forward my note on call his office to arrange for the patient on appointment to proceed with the palliative radiotherapy and to avoid any further delay in her treatment. The patient continues to have generalized weakness and I think she may benefit from transferred to skilled nursing facility for rehabilitation. I will arrange for the patient to come back for follow-up  visit at the [facility] in 2 weeks for more detailed discussion of her treatment options after the palliative radiotherapy to the brain. Thank you so much for taking good care of Kristin Griffin, I will continue to follow up the patient with you and assist in her management an as-needed basis.  Disclaimer: This note was dictated with voice recognition software. Similar sounding words can inadvertently be transcribed and may be missed upon review.

## 2015-01-12 ENCOUNTER — Other Ambulatory Visit: Payer: Self-pay | Admitting: Radiation Therapy

## 2015-01-12 ENCOUNTER — Telehealth: Payer: Self-pay | Admitting: Medical Oncology

## 2015-01-12 DIAGNOSIS — C7931 Secondary malignant neoplasm of brain: Secondary | ICD-10-CM

## 2015-01-12 LAB — BASIC METABOLIC PANEL
Anion gap: 10 (ref 5–15)
BUN: 19 mg/dL (ref 6–20)
CHLORIDE: 104 mmol/L (ref 101–111)
CO2: 23 mmol/L (ref 22–32)
CREATININE: 0.6 mg/dL (ref 0.44–1.00)
Calcium: 9.2 mg/dL (ref 8.9–10.3)
Glucose, Bld: 95 mg/dL (ref 65–99)
POTASSIUM: 4.3 mmol/L (ref 3.5–5.1)
SODIUM: 137 mmol/L (ref 135–145)

## 2015-01-12 LAB — CBC
HCT: 41.5 % (ref 36.0–46.0)
HEMOGLOBIN: 14.8 g/dL (ref 12.0–15.0)
MCH: 32.5 pg (ref 26.0–34.0)
MCHC: 35.7 g/dL (ref 30.0–36.0)
MCV: 91.2 fL (ref 78.0–100.0)
PLATELETS: 403 10*3/uL — AB (ref 150–400)
RBC: 4.55 MIL/uL (ref 3.87–5.11)
RDW: 13.6 % (ref 11.5–15.5)
WBC: 12.9 10*3/uL — ABNORMAL HIGH (ref 4.0–10.5)

## 2015-01-12 LAB — GLUCOSE, CAPILLARY
GLUCOSE-CAPILLARY: 96 mg/dL (ref 65–99)
GLUCOSE-CAPILLARY: 121 mg/dL — AB (ref 65–99)
Glucose-Capillary: 119 mg/dL — ABNORMAL HIGH (ref 65–99)

## 2015-01-12 MED ORDER — DOCUSATE SODIUM 100 MG PO CAPS: 100.0000 mg | ORAL_CAPSULE | Freq: Two times a day (BID) | ORAL | Status: AC

## 2015-01-12 MED ORDER — LISINOPRIL-HYDROCHLOROTHIAZIDE 20-25 MG PO TABS: 1.0000 | ORAL_TABLET | Freq: Every day | ORAL | Status: DC

## 2015-01-12 MED ORDER — ENSURE ENLIVE PO LIQD: 237.0000 mL | Freq: Two times a day (BID) | ORAL | Status: AC

## 2015-01-12 MED ORDER — LISINOPRIL 20 MG PO TABS
20.0000 mg | ORAL_TABLET | Freq: Every day | ORAL | Status: DC
  Administered 2015-01-12: 20 mg via ORAL
  Filled 2015-01-12: qty 1

## 2015-01-12 MED ORDER — POLYETHYLENE GLYCOL 3350 17 G PO PACK: 17.0000 g | PACK | Freq: Two times a day (BID) | ORAL | Status: AC

## 2015-01-12 MED ORDER — OXYCODONE HCL 5 MG PO TABS: 5.0000 mg | ORAL_TABLET | Freq: Four times a day (QID) | ORAL | Status: DC | PRN

## 2015-01-12 MED ORDER — DEXAMETHASONE 4 MG PO TABS: 4.0000 mg | ORAL_TABLET | Freq: Four times a day (QID) | ORAL | Status: DC

## 2015-01-12 MED ORDER — INSULIN ASPART 100 UNIT/ML ~~LOC~~ SOLN: 3.0000 [IU] | Freq: Three times a day (TID) | SUBCUTANEOUS | Status: DC

## 2015-01-12 MED ORDER — ONDANSETRON HCL 4 MG PO TABS: 4.0000 mg | ORAL_TABLET | Freq: Four times a day (QID) | ORAL | Status: AC | PRN

## 2015-01-12 MED ORDER — PANTOPRAZOLE SODIUM 40 MG PO TBEC: 40.0000 mg | DELAYED_RELEASE_TABLET | Freq: Every day | ORAL | Status: AC

## 2015-01-12 MED ORDER — HYDROCHLOROTHIAZIDE 25 MG PO TABS
25.0000 mg | ORAL_TABLET | Freq: Every day | ORAL | Status: DC
  Administered 2015-01-12: 25 mg via ORAL
  Filled 2015-01-12: qty 1

## 2015-01-12 MED ORDER — INSULIN ASPART 100 UNIT/ML ~~LOC~~ SOLN: 0.0000 [IU] | Freq: Three times a day (TID) | SUBCUTANEOUS | Status: DC

## 2015-01-12 MED ORDER — DEXAMETHASONE 4 MG PO TABS
4.0000 mg | ORAL_TABLET | Freq: Four times a day (QID) | ORAL | Status: DC
  Administered 2015-01-12 (×3): 4 mg via ORAL
  Filled 2015-01-12 (×11): qty 1

## 2015-01-12 MED ORDER — BISACODYL 10 MG RE SUPP: 10.0000 mg | Freq: Every day | RECTAL | Status: AC

## 2015-01-12 NOTE — Progress Notes (Signed)
Kristin Griffin to be D/C'd Skilled nursing facility [facility]) per MD order.  Discussed with the patient and all questions fully answered. Called report to facility but got no answer.     Medication List    STOP taking these medications        gentamicin 0.3 % ophthalmic solution  Commonly known as:  GARAMYCIN      TAKE these medications        alendronate 70 MG tablet  Commonly known as:  FOSAMAX  Take 1 tablet (70 mg total) by mouth every 7 (seven) days. Take with a full glass of water on an empty stomach.     bisacodyl 10 MG suppository  Commonly known as:  DULCOLAX  Place 1 suppository (10 mg total) rectally daily.     calcium carbonate 600 MG Tabs tablet  Commonly known as:  OS-CAL  Take 600 mg by mouth daily with breakfast.     dexamethasone 4 MG tablet  Commonly known as:  DECADRON  Take 1 tablet (4 mg total) by mouth every 6 (six) hours.     docusate sodium 100 MG capsule  Commonly known as:  COLACE  Take 1 capsule (100 mg total) by mouth 2 (two) times daily.     feeding supplement (ENSURE ENLIVE) Liqd  Take 237 mLs by mouth 2 (two) times daily between meals.     Fish Oil 1000 MG Caps  Take by mouth.     insulin aspart 100 UNIT/ML injection  Commonly known as:  novoLOG  Inject 0-15 Units into the skin 3 (three) times daily with meals. Sliding scale  CBG 70 - 120: 0 units: CBG 121 - 150: 2 units; CBG 151 - 200: 3 units; CBG 201 - 250: 5 units; CBG 251 - 300: 8 units;CBG 301 - 350: 11 units; CBG 351 - 400: 15 units; CBG > 400 : 15 units and notify MD     insulin aspart 100 UNIT/ML injection  Commonly known as:  novoLOG  Inject 3 Units into the skin 3 (three) times daily with meals.     lisinopril-hydrochlorothiazide 20-25 MG per tablet  Commonly known as:  PRINZIDE,ZESTORETIC  take 1 tablet by mouth once daily     multivitamin capsule  Take 1 capsule by mouth daily.     ondansetron 4 MG tablet  Commonly known as:  ZOFRAN  Take 1 tablet (4 mg  total) by mouth every 6 (six) hours as needed for nausea.     oxyCODONE 5 MG immediate release tablet  Commonly known as:  Oxy IR/ROXICODONE  Take 1 tablet (5 mg total) by mouth every 6 (six) hours as needed for moderate pain.     pantoprazole 40 MG tablet  Commonly known as:  PROTONIX  Take 1 tablet (40 mg total) by mouth daily.     polyethylene glycol packet  Commonly known as:  MIRALAX / GLYCOLAX  Take 17 g by mouth 2 (two) times daily.     verapamil 240 MG CR tablet  Commonly known as:  CALAN-SR  TAKE 1 TABLET BY MOUTH AT BEDTIME        VVS, Skin clean, dry and intact without evidence of skin break down, no evidence of skin tears noted. IV catheter discontinued intact. Site without signs and symptoms of complications. Dressing and pressure applied.  [facility] has been called and patient to be transported this afternoon.   Elias Else D 01/12/2015 3:03 PM

## 2015-01-12 NOTE — Telephone Encounter (Signed)
I gave Fraser Din the appts for Dr Julien Nordmann and transferred her call to radiation oncology.

## 2015-01-12 NOTE — Discharge Summary (Signed)
Physician Discharge Summary   Patient ID: Kristin Griffin MRN: [id number] DOB/AGE: [birth year] 64 y.o.  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 Right sided weakness   hypertension   Osteoporosis   Tobacco abuse     Consults: Oncology, Dr. Julien Nordmann Radiation oncology, Dr. Lisbeth Renshaw Pulmonology, Dr. Ashok Cordia, Dr [facility] Inpatient rehabilitation Neurosurgery, Dr. Deri Fuelling    Recommendations for Outpatient Follow-up:   Patient has appointment set up with Dr. Julien Nordmann and [facility] on 01/28/15 at 11:15 AM. Patient will need outpatient PET CT scan which will be arranged by oncology  Patient will need appointment with radiation oncology, Dr. Lisbeth Renshaw to set up for her radiation treatments. Radiation oncology is aware.   TESTS THAT NEED FOLLOW-UP CBC, BMET   DIET: Carb modified diet    Allergies:  No Known Allergies   Discharge Medications:   Medication List    STOP taking these medications        gentamicin 0.3 % ophthalmic solution  Commonly known as:  GARAMYCIN      TAKE these medications        alendronate 70 MG tablet  Commonly known as:  FOSAMAX  Take 1 tablet (70 mg total) by mouth every 7 (seven) days. Take with a full glass of water on an empty stomach.     bisacodyl 10 MG suppository  Commonly known as:  DULCOLAX  Place 1 suppository (10 mg total) rectally daily.     calcium carbonate 600 MG Tabs tablet  Commonly known as:  OS-CAL  Take 600 mg by mouth daily with breakfast.     dexamethasone 4 MG tablet  Commonly known as:  DECADRON  Take 1 tablet (4 mg total) by mouth every 6 (six) hours.     docusate sodium 100 MG capsule  Commonly known as:  COLACE  Take 1 capsule (100 mg total) by mouth 2 (two) times daily.     feeding  supplement (ENSURE ENLIVE) Liqd  Take 237 mLs by mouth 2 (two) times daily between meals.     Fish Oil 1000 MG Caps  Take by mouth.     insulin aspart 100 UNIT/ML injection  Commonly known as:  novoLOG  Inject 0-15 Units into the skin 3 (three) times daily with meals. Sliding scale  CBG 70 - 120: 0 units: CBG 121 - 150: 2 units; CBG 151 - 200: 3 units; CBG 201 - 250: 5 units; CBG 251 - 300: 8 units;CBG 301 - 350: 11 units; CBG 351 - 400: 15 units; CBG > 400 : 15 units and notify MD     insulin aspart 100 UNIT/ML injection  Commonly known as:  novoLOG  Inject 3 Units into the skin 3 (three) times daily with meals.     lisinopril-hydrochlorothiazide 20-25 MG per tablet  Commonly known as:  PRINZIDE,ZESTORETIC  take 1 tablet by mouth once daily     multivitamin capsule  Take 1 capsule by mouth daily.     ondansetron 4 MG tablet  Commonly known as:  ZOFRAN  Take 1 tablet (4 mg total) by mouth every 6 (six) hours as needed for nausea.     oxyCODONE 5 MG immediate release tablet  Commonly known as:  Oxy IR/ROXICODONE  Take 1 tablet (5 mg total)  by mouth every 6 (six) hours as needed for moderate pain.     pantoprazole 40 MG tablet  Commonly known as:  PROTONIX  Take 1 tablet (40 mg total) by mouth daily.     polyethylene glycol packet  Commonly known as:  MIRALAX / GLYCOLAX  Take 17 g by mouth 2 (two) times daily.     verapamil 240 MG CR tablet  Commonly known as:  CALAN-SR  TAKE 1 TABLET BY MOUTH AT BEDTIME         Brief H and P: For complete details please refer to admission H and P, but in brief Kristin Griffin is a 64 y.o. female with a history of HTN who presents to the ED with complaints of Right sided Weakness and progressive decline for the past 3 weeks.The patient had presented with unintentional weight loss of 10 pounds in the past 5 weeks, no fevers or chills. Patient's brothers notice that she was walking and dragging her right leg and brought her to the ED. CT  head showed 2.3 into 1.4 cm mass in the right cerebellar hemisphere, 2.5 and 2.1 cm mass in the left centrum semiovale with white matter edema. It was followed by a CT chest with contrast to evaluate for a primary. CT chest showed multiple pulmonary nodules. Patient was referred to admission.  Hospital Course:  Brain mass, right lung mass, bony lucencies in the lumbar spine, renal lesion, underlying smoking history one pack per day for 45-50 years: Patient had presented with unintentional weight loss, progressive decline, right leg weakness which prompted her ER evaluation. CT head showed 2.3 into 1.4 cm mass in the right cerebellar hemisphere, 2.5 and 2.1 cm mass in the left centrum semiovale with white matter edema.MRI of the brain confirmed to enhancing brain masses consistent with metastasis. Patient was placed on IV Decadron. Neurosurgery was consulted, patient was seen by Dr. Deri Fuelling. Neurosurgery did not recommend resection however recommended radiation therapy. Radiation oncology has been consulted. Per Dr. Lisbeth Renshaw, patient will be scheduled for radiation therapy outpatient, simulation/treatment planning session will be arranged by radiation oncology. Patient underwent lung biopsy on 9/16, pathology confirmed adenocarcinoma.  Right lung mass - Patient has multiple lung masses, including spiculated lesion in the right middle lobe 2.8X2.3 centimeter, underlying emphysema, multifocal neoplasm also of concern. Patient underwent lung biopsy by pulmonology on 9/16. Pathology confirmed adenocarcinoma. Patient did receive levofloxacin for 8 days for suspected postobstructive pneumonia, does not need any further antibiotics.patient will have outpatient PET CT scan arranged by the oncology.   Left renal upper pole lesion, lumbar vertebra lesions - Renal ultrasound upper left renal lesion not visualized, recommend nonemergent MRI, to be performed outpatient   Protein-calorie malnutrition, severe with  underlying possible malignancy - continue nutritional supplements  Right-sided weakness likely due to the brain masses and edema - Continue physical theraporal Decadron  Bony lesions: Bone scan: Lesions bilateral ribs and left femur consistent with fractures visible on recent CT scan. No mets   Hypertension Currently stable, continue verapamil, lisinopril HCTZ   Day of Discharge BP 154/70 mmHg  Pulse 92  Temp(Src) 98.1 F (36.7 C) (Oral)  Resp 16  Ht '5\' 6"'$  (1.676 m)  Wt 47.7 kg (105 lb 2.6 oz)  BMI 16.98 kg/m2  SpO2 98%  Physical Exam: General: Alert and awake oriented x3 not in any acute distress. HEENT: anicteric sclera, pupils reactive to light and accommodation CVS: S1-S2 clear no murmur rubs or gallops Chest: clear to auscultation bilaterally,  no wheezing rales or rhonchi Abdomen: soft nontender, nondistended, normal bowel sounds Extremities: no cyanosis, clubbing or edema noted bilaterally Neuro: Cranial nerves II-XII intact, no focal neurological deficits   The results of significant diagnostics from this hospitalization (including imaging, microbiology, ancillary and laboratory) are listed below for reference.    LAB RESULTS: Basic Metabolic Panel:  Recent Labs Lab 01/08/15 0748 01/12/15 0845  NA 138 137  K 4.2 4.3  CL 104 104  CO2 28 23  GLUCOSE 112* 95  BUN 19 19  CREATININE 0.68 0.60  CALCIUM 9.3 9.2   Liver Function Tests: No results for input(s): AST, ALT, ALKPHOS, BILITOT, PROT, ALBUMIN in the last 168 hours. No results for input(s): LIPASE, AMYLASE in the last 168 hours. No results for input(s): AMMONIA in the last 168 hours. CBC:  Recent Labs Lab 01/08/15 0748 01/12/15 0845  WBC 5.5 12.9*  HGB 13.0 14.8  HCT 39.7 41.5  MCV 95.0 91.2  PLT 339 403*   Cardiac Enzymes: No results for input(s): CKTOTAL, CKMB, CKMBINDEX, TROPONINI in the last 168 hours. BNP: Invalid input(s): POCBNP CBG:  Recent Labs Lab 01/12/15 0816 01/12/15 1200   GLUCAP 96 121*    Significant Diagnostic Studies:  No results found.  2D ECHO:   Disposition and Follow-up:     Discharge Instructions    Diet Carb Modified    Complete by:  As directed      Increase activity slowly    Complete by:  As directed             DISPOSITION: Skilled nursing facility  DISCHARGE FOLLOW-UP Follow-up Information    Follow up with Eilleen Kempf., MD On 01/28/2015.   Specialty:  Oncology   Why:  for hospital follow-up at 11:15AM   Contact information:   [place] [address] [phone]       Follow up with Kyung Rudd, MD. Schedule an appointment as soon as possible for a visit in 10 days.   Specialty:  Radiation Oncology   Why:  for hospital follow-up. Office will contact you but please call in 1-2 days to confirm if you have not heard from the office   Contact information:   [address] [place] [address] [phone]        Time spent on Discharge: 2 MINS   Signed:   RAI,RIPUDEEP M.D. [facility] 01/12/2015, 12:34 PM Pager: [phone]

## 2015-01-12 NOTE — Discharge Planning (Signed)
Patient to be discharged to [facility]. Patient's cousin, Kathrynn Humble, updated regarding discharge.  Facility: [facility] RN report number: [phone] Transportation: EMS  Lubertha Sayres, [place] [phone]) and Surgical [phone])

## 2015-01-12 NOTE — Progress Notes (Signed)
NURSING PROGRESS NOTE  Kristin Griffin [id number] Discharge Data: 01/12/2015 4:18 PM Attending Provider: Mendel Corning, MD PJK:DTOI,ZTI, MD     [name] to be D/C'd Skilled nursing facility per MD order. All IV's discontinued with no bleeding noted. All belongings returned to patient for patient to take to [facility]. Pt transported via stretcher by [employer] staff.  Last Vital Signs:  Blood pressure 149/79, pulse 103, temperature 97.8 F (36.6 C), temperature source Oral, resp. rate 16, height '5\' 6"'$  (1.676 m), weight 47.7 kg (105 lb 2.6 oz), SpO2 95 %.  Discharge Medication List   Medication List    STOP taking these medications        gentamicin 0.3 % ophthalmic solution  Commonly known as:  GARAMYCIN      TAKE these medications        alendronate 70 MG tablet  Commonly known as:  FOSAMAX  Take 1 tablet (70 mg total) by mouth every 7 (seven) days. Take with a full glass of water on an empty stomach.     bisacodyl 10 MG suppository  Commonly known as:  DULCOLAX  Place 1 suppository (10 mg total) rectally daily.     calcium carbonate 600 MG Tabs tablet  Commonly known as:  OS-CAL  Take 600 mg by mouth daily with breakfast.     dexamethasone 4 MG tablet  Commonly known as:  DECADRON  Take 1 tablet (4 mg total) by mouth every 6 (six) hours.     docusate sodium 100 MG capsule  Commonly known as:  COLACE  Take 1 capsule (100 mg total) by mouth 2 (two) times daily.     feeding supplement (ENSURE ENLIVE) Liqd  Take 237 mLs by mouth 2 (two) times daily between meals.     Fish Oil 1000 MG Caps  Take by mouth.     insulin aspart 100 UNIT/ML injection  Commonly known as:  novoLOG  Inject 0-15 Units into the skin 3 (three) times daily with meals. Sliding scale  CBG 70 - 120: 0 units: CBG 121 - 150: 2 units; CBG 151 - 200: 3 units; CBG 201 - 250: 5 units; CBG 251 - 300: 8 units;CBG 301 - 350: 11 units; CBG 351 - 400: 15 units; CBG > 400 : 15 units and notify MD     insulin aspart 100 UNIT/ML injection  Commonly known as:  novoLOG  Inject 3 Units into the skin 3 (three) times daily with meals.     lisinopril-hydrochlorothiazide 20-25 MG per tablet  Commonly known as:  PRINZIDE,ZESTORETIC  take 1 tablet by mouth once daily     multivitamin capsule  Take 1 capsule by mouth daily.     ondansetron 4 MG tablet  Commonly known as:  ZOFRAN  Take 1 tablet (4 mg total) by mouth every 6 (six) hours as needed for nausea.     oxyCODONE 5 MG immediate release tablet  Commonly known as:  Oxy IR/ROXICODONE  Take 1 tablet (5 mg total) by mouth every 6 (six) hours as needed for moderate pain.     pantoprazole 40 MG tablet  Commonly known as:  PROTONIX  Take 1 tablet (40 mg total) by mouth daily.     polyethylene glycol packet  Commonly known as:  MIRALAX / GLYCOLAX  Take 17 g by mouth 2 (two) times daily.     verapamil 240 MG CR tablet  Commonly known as:  CALAN-SR  TAKE 1 TABLET BY MOUTH AT  BEDTIME         Charolette Child, RN

## 2015-01-12 NOTE — Progress Notes (Signed)
Physical Therapy Treatment Patient Details Name: Kristin Griffin MRN: [id number] DOB: 11-15-1950 Today's Date: 01/12/2015    History of Present Illness 64 y.o. female presents with complaints of Right sided Weakness and progressive decline for the past 3 weeks.  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    PT Comments    Pt continues to make steady progress.  Follow Up Recommendations  SNF (CIR recommending SNF)     Equipment Recommendations  Rolling walker with 5" wheels    Recommendations for Other Services       Precautions / Restrictions Precautions Precautions: Fall Restrictions Weight Bearing Restrictions: No    Mobility  Bed Mobility Overal bed mobility: Needs Assistance Bed Mobility: Supine to Sit     Supine to sit: Supervision     General bed mobility comments: Incr time  Transfers Overall transfer level: Needs assistance Equipment used: Rolling walker (2 wheeled)   Sit to Stand: Min guard         General transfer comment: Assist for balance  Ambulation/Gait Ambulation/Gait assistance: Min guard Ambulation Distance (Feet): 350 Feet Assistive device: Rolling walker (2 wheeled) Gait Pattern/deviations: Step-through pattern;Decreased step length - right Gait velocity: decreased Gait velocity interpretation: Below normal speed for age/gender General Gait Details: Pt clearing rt foot and stepping past lt foot about 90% of time. Occasionally needs verbal cues to step through with rt foot. On turns pt requires cues to bring rt foot through.   Stairs            Wheelchair Mobility    Modified Rankin (Stroke Patients Only) Modified Rankin (Stroke Patients Only) Pre-Morbid Rankin Score: No symptoms Modified Rankin: Moderately  severe disability     Balance     Sitting balance-Leahy Scale: Fair       Standing balance-Leahy Scale: Poor                      Cognition Arousal/Alertness: Awake/alert Behavior During Therapy: WFL for tasks assessed/performed   Area of Impairment: Attention   Current Attention Level: Selective         Problem Solving: Requires verbal cues      Exercises      General Comments        Pertinent Vitals/Pain Pain Assessment: No/denies pain    Home Living                      Prior Function            PT Goals (current goals can now be found in the care plan section) Acute Rehab PT Goals PT Goal Formulation: With patient/family Time For Goal Achievement: 01/19/15 Potential to Achieve Goals: Good Progress towards PT goals: Progressing toward goals    Frequency  Min 3X/week    PT Plan Discharge plan needs to be updated    Co-evaluation             End of Session   Activity Tolerance: Patient tolerated treatment well Patient left: in chair;with call bell/phone within reach;with family/visitor present;with chair alarm set     Time: 1150-1159 PT Time Calculation (min) (ACUTE ONLY): 9 min  Charges:  $Gait Training: 8-22 mins  G Codes:      Neeta Storey 01/12/2015, 12:40 PM [place]

## 2015-01-12 NOTE — Clinical Social Work Placement (Signed)
   CLINICAL SOCIAL WORK PLACEMENT  NOTE  Date:  01/12/2015  Patient Details  Name: Kristin Griffin MRN: [id number] Date of Birth: [birth year]  Clinical Social Work is seeking post-discharge placement for this patient at the [place] level of care (*CSW will initial, date and re-position this form in  chart as items are completed):  Yes   Patient/family provided with [facility] Work Department's list of facilities offering this level of care within the geographic area requested by the patient (or if unable, by the patient's family).  Yes   Patient/family informed of their freedom to choose among providers that offer the needed level of care, that participate in Medicare, Medicaid or managed care program needed by the patient, have an available bed and are willing to accept the patient.  Yes   Patient/family informed of [facility]'s ownership interest in [facility] and [facility], as well as of the fact that they are under no obligation to receive care at these facilities.  PASRR submitted to EDS on 01/07/15     PASRR number received on 01/07/15     Existing PASRR number confirmed on       FL2 transmitted to all facilities in geographic area requested by pt/family on 01/07/15     FL2 transmitted to all facilities within larger geographic area on       Patient informed that his/her managed care company has contracts with or will negotiate with certain facilities, including the following:        Yes   Patient/family informed of bed offers received.  Patient chooses bed at [facility]     Physician recommends and patient chooses bed at      Patient to be transferred to [facility] on 01/12/15.  Patient to be transferred to facility by [facility]     Patient family notified on 01/12/15 of transfer.  Name of family member notified:  Kathrynn Humble     PHYSICIAN       Additional Comment:     _______________________________________________ Caroline Sauger, LCSW 01/12/2015, 2:29 PM

## 2015-01-12 NOTE — Care Management Note (Signed)
Case Management Note  Patient Details  Name: VERSIA MIGNOGNA MRN: [id number] Date of Birth: 1951/01/08                 Subjective/Objective: Spoke with patient, son, and daughter in law at the bedside. Patient comes from home, lives alone due to increased weakness to right side. CM requested PT and Palliative consult order from nurse, offered to call MD; RN stated he would get order. Patient's husband [name] passed away at [facility] on 12-28-14. Patient very emotional, states she has not received his ashes yet, she states she has not had time to process all that is going on. CM offered Chaplain consult, patient declined. Patient found to have lesions to lung and brain, new CA diagnosis. Patient stated that she would want to be involved in making decisions about where she was discharged to. CM reviewed with patient and family that we would have PT work with her to evaluate how much support she needed to manage her ADL's at home. PT would recommend if they thought she needed therapy at a SNF, or if she could be managed at home. Explained in detail to patient and family the amount of therapy that would be received at home vs at a facility. Reassured that she is able to decide her disposition and that the CM and SW will work with her and her family to make sure a plan, she is a part of making, is in place and that is safe. Patient states that she has a cane, a walker, and a tub bench at home, and has never used a home health company. CM told patient and family that there are several companies to choose from and if she goes home we can also offer resources for private duty aids, and these would be self pay. Family and patient appreciative of time spent with them.   01-12-15 DC to SNF, facilitated by SW.    Expected Discharge Date:  01/06/15               Expected Discharge Plan:  Skilled Nursing Facility  In-House Referral:  Clinical Social Work  Discharge planning Services  CM  Consult  Post Acute Care Choice:    Choice offered to:     DME Arranged:    DME Agency:     HH Arranged:    [place] Agency:     Status of Service:  Completed, signed off  Medicare Important Message Given:  Yes-third notification given Date Medicare IM Given:    Medicare IM give by:    Date Additional Medicare IM Given:    Additional Medicare Important Message give by:     If discussed at [place] of Stay Meetings, dates discussed:    Additional Comments:  Carles Collet, RN 01/12/2015, 9:08 AM

## 2015-01-12 NOTE — Progress Notes (Signed)
PCCM Attending:  Spoke with the patient and her cousin at bedside. She is already aware of the results from her bronchoscopy pathology showing adenocarcinoma. Reviewing her electronic medical record radiation oncology is aware and plans to proceed with treatment without surgical intervention. Per the patient and her cousin she is going to be discharged to skilled nursing facility for further rehabilitation. At the time of my visit she was on the phone with her Hospitalist discussing the ongoing plan of care for her malignancy. I wished her all the best and gave her our office card with instructions to contact me if we can be of any further assistance. At this time I will sign off.  Sonia Baller Ashok Cordia, M.D. [facility] Pager:  [phone] After 3pm or if no response, call [phone]

## 2015-01-12 NOTE — Progress Notes (Signed)
Report called to Bethena Roys, nurse at [facility]

## 2015-01-13 ENCOUNTER — Encounter: Payer: Self-pay | Admitting: Radiation Therapy

## 2015-01-13 NOTE — Progress Notes (Addendum)
Spoke with Vickie at [facility] to discuss Kristin Griffin's upcoming appointments for [employer] planning and delivery.   MRI at [place] location: 9/28 arrive at 3:30  [facility] here at the [facility] 9/30 arrive at 10:00.  Treatment here at the [facility] 10/5 arrive at [place] here at the [facility] 10/7 arrive at 12:15  Treatment here at the [facility] 10/10 arrive at [place] wrote down these times and plans to transport her to each of the above appointments.  Mont Dutton R.T.(R).(T). Special Procedures Navigator - Radiation Oncology

## 2015-01-15 ENCOUNTER — Encounter (HOSPITAL_COMMUNITY): Payer: Self-pay

## 2015-01-16 ENCOUNTER — Emergency Department (HOSPITAL_COMMUNITY): Payer: Medicare Other

## 2015-01-16 ENCOUNTER — Encounter (HOSPITAL_COMMUNITY): Payer: Self-pay | Admitting: Emergency Medicine

## 2015-01-16 ENCOUNTER — Inpatient Hospital Stay (HOSPITAL_COMMUNITY)
Admission: EM | Admit: 2015-01-16 | Discharge: 2015-01-24 | DRG: 492 | Disposition: A | Discharge status: Hospice | Payer: Medicare Other | Attending: Family Medicine | Admitting: Family Medicine

## 2015-01-16 DIAGNOSIS — S82101A Unspecified fracture of upper end of right tibia, initial encounter for closed fracture: Secondary | ICD-10-CM | POA: Diagnosis present

## 2015-01-16 DIAGNOSIS — C7931 Secondary malignant neoplasm of brain: Secondary | ICD-10-CM | POA: Diagnosis present

## 2015-01-16 DIAGNOSIS — E871 Hypo-osmolality and hyponatremia: Secondary | ICD-10-CM | POA: Diagnosis present

## 2015-01-16 DIAGNOSIS — Z79891 Long term (current) use of opiate analgesic: Secondary | ICD-10-CM | POA: Diagnosis not present

## 2015-01-16 DIAGNOSIS — E43 Unspecified severe protein-calorie malnutrition: Secondary | ICD-10-CM | POA: Diagnosis present

## 2015-01-16 DIAGNOSIS — J9601 Acute respiratory failure with hypoxia: Secondary | ICD-10-CM | POA: Diagnosis not present

## 2015-01-16 DIAGNOSIS — R531 Weakness: Secondary | ICD-10-CM | POA: Diagnosis present

## 2015-01-16 DIAGNOSIS — M810 Age-related osteoporosis without current pathological fracture: Secondary | ICD-10-CM | POA: Diagnosis present

## 2015-01-16 DIAGNOSIS — Y92481 Parking lot as the place of occurrence of the external cause: Secondary | ICD-10-CM | POA: Diagnosis not present

## 2015-01-16 DIAGNOSIS — W19XXXA Unspecified fall, initial encounter: Secondary | ICD-10-CM | POA: Diagnosis present

## 2015-01-16 DIAGNOSIS — R6521 Severe sepsis with septic shock: Secondary | ICD-10-CM | POA: Diagnosis not present

## 2015-01-16 DIAGNOSIS — C3430 Malignant neoplasm of lower lobe, unspecified bronchus or lung: Secondary | ICD-10-CM | POA: Diagnosis not present

## 2015-01-16 DIAGNOSIS — S8290XS Unspecified fracture of unspecified lower leg, sequela: Secondary | ICD-10-CM | POA: Diagnosis not present

## 2015-01-16 DIAGNOSIS — S82201A Unspecified fracture of shaft of right tibia, initial encounter for closed fracture: Secondary | ICD-10-CM | POA: Insufficient documentation

## 2015-01-16 DIAGNOSIS — S8290XA Unspecified fracture of unspecified lower leg, initial encounter for closed fracture: Secondary | ICD-10-CM | POA: Diagnosis not present

## 2015-01-16 DIAGNOSIS — E860 Dehydration: Secondary | ICD-10-CM | POA: Diagnosis present

## 2015-01-16 DIAGNOSIS — D62 Acute posthemorrhagic anemia: Secondary | ICD-10-CM | POA: Diagnosis not present

## 2015-01-16 DIAGNOSIS — R627 Adult failure to thrive: Secondary | ICD-10-CM | POA: Diagnosis present

## 2015-01-16 DIAGNOSIS — N179 Acute kidney failure, unspecified: Secondary | ICD-10-CM | POA: Diagnosis not present

## 2015-01-16 DIAGNOSIS — R1084 Generalized abdominal pain: Secondary | ICD-10-CM | POA: Diagnosis not present

## 2015-01-16 DIAGNOSIS — G9341 Metabolic encephalopathy: Secondary | ICD-10-CM | POA: Diagnosis not present

## 2015-01-16 DIAGNOSIS — Z8781 Personal history of (healed) traumatic fracture: Secondary | ICD-10-CM

## 2015-01-16 DIAGNOSIS — G893 Neoplasm related pain (acute) (chronic): Secondary | ICD-10-CM | POA: Diagnosis not present

## 2015-01-16 DIAGNOSIS — Z66 Do not resuscitate: Secondary | ICD-10-CM | POA: Diagnosis present

## 2015-01-16 DIAGNOSIS — Z87891 Personal history of nicotine dependence: Secondary | ICD-10-CM

## 2015-01-16 DIAGNOSIS — S82291A Other fracture of shaft of right tibia, initial encounter for closed fracture: Secondary | ICD-10-CM

## 2015-01-16 DIAGNOSIS — S82209A Unspecified fracture of shaft of unspecified tibia, initial encounter for closed fracture: Secondary | ICD-10-CM | POA: Diagnosis present

## 2015-01-16 DIAGNOSIS — Z01818 Encounter for other preprocedural examination: Secondary | ICD-10-CM | POA: Insufficient documentation

## 2015-01-16 DIAGNOSIS — K631 Perforation of intestine (nontraumatic): Secondary | ICD-10-CM | POA: Diagnosis present

## 2015-01-16 DIAGNOSIS — C7981 Secondary malignant neoplasm of breast: Secondary | ICD-10-CM

## 2015-01-16 DIAGNOSIS — J95821 Acute postprocedural respiratory failure: Secondary | ICD-10-CM | POA: Diagnosis not present

## 2015-01-16 DIAGNOSIS — Z794 Long term (current) use of insulin: Secondary | ICD-10-CM | POA: Diagnosis not present

## 2015-01-16 DIAGNOSIS — K659 Peritonitis, unspecified: Secondary | ICD-10-CM | POA: Diagnosis present

## 2015-01-16 DIAGNOSIS — C3490 Malignant neoplasm of unspecified part of unspecified bronchus or lung: Secondary | ICD-10-CM | POA: Diagnosis present

## 2015-01-16 DIAGNOSIS — Z515 Encounter for palliative care: Secondary | ICD-10-CM | POA: Diagnosis not present

## 2015-01-16 DIAGNOSIS — I10 Essential (primary) hypertension: Secondary | ICD-10-CM | POA: Diagnosis present

## 2015-01-16 DIAGNOSIS — Z79899 Other long term (current) drug therapy: Secondary | ICD-10-CM | POA: Diagnosis not present

## 2015-01-16 DIAGNOSIS — S82831A Other fracture of upper and lower end of right fibula, initial encounter for closed fracture: Secondary | ICD-10-CM

## 2015-01-16 DIAGNOSIS — A419 Sepsis, unspecified organism: Secondary | ICD-10-CM | POA: Diagnosis not present

## 2015-01-16 DIAGNOSIS — L8990 Pressure ulcer of unspecified site, unspecified stage: Secondary | ICD-10-CM | POA: Insufficient documentation

## 2015-01-16 DIAGNOSIS — Z9889 Other specified postprocedural states: Secondary | ICD-10-CM

## 2015-01-16 DIAGNOSIS — R109 Unspecified abdominal pain: Secondary | ICD-10-CM | POA: Insufficient documentation

## 2015-01-16 DIAGNOSIS — S82409A Unspecified fracture of shaft of unspecified fibula, initial encounter for closed fracture: Secondary | ICD-10-CM

## 2015-01-16 DIAGNOSIS — R100 Acute abdomen: Secondary | ICD-10-CM | POA: Diagnosis not present

## 2015-01-16 HISTORY — DX: Malignant (primary) neoplasm, unspecified: C80.1

## 2015-01-16 LAB — BASIC METABOLIC PANEL
Anion gap: 10 (ref 5–15)
BUN: 22 mg/dL — AB (ref 6–20)
CO2: 25 mmol/L (ref 22–32)
Calcium: 8.8 mg/dL — ABNORMAL LOW (ref 8.9–10.3)
Chloride: 98 mmol/L — ABNORMAL LOW (ref 101–111)
Creatinine, Ser: 0.56 mg/dL (ref 0.44–1.00)
GFR calc Af Amer: >60 mL/min (ref >60)
GLUCOSE: 94 mg/dL (ref 65–99)
POTASSIUM: 4.6 mmol/L (ref 3.5–5.1)
Sodium: 133 mmol/L — ABNORMAL LOW (ref 135–145)

## 2015-01-16 LAB — CBC WITH DIFFERENTIAL/PLATELET
Basophils Absolute: 0 10*3/uL (ref 0.0–0.1)
Basophils Relative: 0 %
EOS PCT: 0 %
Eosinophils Absolute: 0 10*3/uL (ref 0.0–0.7)
HEMATOCRIT: 37.5 % (ref 36.0–46.0)
Hemoglobin: 13 g/dL (ref 12.0–15.0)
LYMPHS ABS: 0.6 10*3/uL — AB (ref 0.7–4.0)
LYMPHS PCT: 4 %
MCH: 32.3 pg (ref 26.0–34.0)
MCHC: 34.7 g/dL (ref 30.0–36.0)
MCV: 93.1 fL (ref 78.0–100.0)
MONO ABS: 0.6 10*3/uL (ref 0.1–1.0)
Monocytes Relative: 4 %
Neutro Abs: 13.7 10*3/uL — ABNORMAL HIGH (ref 1.7–7.7)
Neutrophils Relative %: 92 %
PLATELETS: 437 10*3/uL — AB (ref 150–400)
RBC: 4.03 MIL/uL (ref 3.87–5.11)
RDW: 13.9 % (ref 11.5–15.5)
WBC: 14.9 10*3/uL — ABNORMAL HIGH (ref 4.0–10.5)

## 2015-01-16 LAB — GLUCOSE, CAPILLARY
GLUCOSE-CAPILLARY: 130 mg/dL — AB (ref 65–99)
GLUCOSE-CAPILLARY: 135 mg/dL — AB (ref 65–99)

## 2015-01-16 MED ORDER — ACETAMINOPHEN 325 MG PO TABS
650.0000 mg | ORAL_TABLET | Freq: Four times a day (QID) | ORAL | Status: DC | PRN
  Filled 2015-01-16: qty 2

## 2015-01-16 MED ORDER — ENOXAPARIN SODIUM 40 MG/0.4ML ~~LOC~~ SOLN
40.0000 mg | SUBCUTANEOUS | Status: DC
  Administered 2015-01-16: 40 mg via SUBCUTANEOUS
  Filled 2015-01-16: qty 0.4

## 2015-01-16 MED ORDER — CALCIUM CARBONATE 600 MG PO TABS: 600.0000 mg | ORAL_TABLET | Freq: Every day | ORAL | Status: DC

## 2015-01-16 MED ORDER — ONDANSETRON HCL 4 MG PO TABS
4.0000 mg | ORAL_TABLET | Freq: Four times a day (QID) | ORAL | Status: DC | PRN
  Administered 2015-01-18: 4 mg via ORAL
  Filled 2015-01-16: qty 1

## 2015-01-16 MED ORDER — ADULT MULTIVITAMIN W/MINERALS CH
1.0000 | ORAL_TABLET | Freq: Every day | ORAL | Status: DC
  Administered 2015-01-17 – 2015-01-19 (×2): 1 via ORAL
  Filled 2015-01-16 (×2): qty 1

## 2015-01-16 MED ORDER — HYDROMORPHONE HCL 1 MG/ML IJ SOLN
1.0000 mg | INTRAMUSCULAR | Status: DC | PRN
  Administered 2015-01-16 (×2): 1 mg via INTRAVENOUS
  Filled 2015-01-16 (×2): qty 1

## 2015-01-16 MED ORDER — BISACODYL 10 MG RE SUPP
10.0000 mg | Freq: Every day | RECTAL | Status: DC
  Administered 2015-01-17: 10 mg via RECTAL
  Filled 2015-01-16: qty 1

## 2015-01-16 MED ORDER — HYDROMORPHONE HCL 1 MG/ML IJ SOLN
1.0000 mg | INTRAMUSCULAR | Status: DC | PRN
  Administered 2015-01-17 – 2015-01-19 (×5): 1 mg via INTRAVENOUS
  Filled 2015-01-16 (×6): qty 1

## 2015-01-16 MED ORDER — VERAPAMIL HCL ER 240 MG PO TBCR
240.0000 mg | EXTENDED_RELEASE_TABLET | Freq: Every day | ORAL | Status: DC
  Administered 2015-01-16 – 2015-01-17 (×2): 240 mg via ORAL
  Filled 2015-01-16 (×4): qty 1

## 2015-01-16 MED ORDER — DOCUSATE SODIUM 100 MG PO CAPS
100.0000 mg | ORAL_CAPSULE | Freq: Two times a day (BID) | ORAL | Status: DC
  Administered 2015-01-16 – 2015-01-19 (×5): 100 mg via ORAL
  Filled 2015-01-16 (×5): qty 1

## 2015-01-16 MED ORDER — ACETAMINOPHEN 650 MG RE SUPP: 650.0000 mg | Freq: Four times a day (QID) | RECTAL | Status: DC | PRN

## 2015-01-16 MED ORDER — INSULIN ASPART 100 UNIT/ML ~~LOC~~ SOLN
0.0000 [IU] | Freq: Three times a day (TID) | SUBCUTANEOUS | Status: DC
  Administered 2015-01-17: 2 [IU] via SUBCUTANEOUS
  Administered 2015-01-17 – 2015-01-18 (×3): 1 [IU] via SUBCUTANEOUS

## 2015-01-16 MED ORDER — INSULIN ASPART 100 UNIT/ML ~~LOC~~ SOLN: 0.0000 [IU] | Freq: Every day | SUBCUTANEOUS | Status: DC

## 2015-01-16 MED ORDER — PANTOPRAZOLE SODIUM 40 MG PO TBEC
40.0000 mg | DELAYED_RELEASE_TABLET | Freq: Every day | ORAL | Status: DC
  Administered 2015-01-17 – 2015-01-19 (×3): 40 mg via ORAL
  Filled 2015-01-16 (×3): qty 1

## 2015-01-16 MED ORDER — CALCIUM CARBONATE 1250 (500 CA) MG PO TABS
1.0000 | ORAL_TABLET | Freq: Every day | ORAL | Status: DC
  Administered 2015-01-17 – 2015-01-19 (×2): 500 mg via ORAL
  Filled 2015-01-16 (×2): qty 1

## 2015-01-16 MED ORDER — POLYETHYLENE GLYCOL 3350 17 G PO PACK
17.0000 g | PACK | Freq: Two times a day (BID) | ORAL | Status: DC
  Administered 2015-01-16 – 2015-01-17 (×3): 17 g via ORAL
  Filled 2015-01-16 (×3): qty 1

## 2015-01-16 MED ORDER — ENSURE ENLIVE PO LIQD
237.0000 mL | Freq: Two times a day (BID) | ORAL | Status: DC
  Administered 2015-01-17 (×2): 237 mL via ORAL

## 2015-01-16 MED ORDER — OXYCODONE HCL 5 MG PO TABS
5.0000 mg | ORAL_TABLET | Freq: Four times a day (QID) | ORAL | Status: DC | PRN
  Administered 2015-01-16 – 2015-01-17 (×3): 5 mg via ORAL
  Filled 2015-01-16 (×4): qty 1

## 2015-01-16 MED ORDER — DEXAMETHASONE 4 MG PO TABS
4.0000 mg | ORAL_TABLET | Freq: Four times a day (QID) | ORAL | Status: DC
  Administered 2015-01-16 – 2015-01-19 (×10): 4 mg via ORAL
  Filled 2015-01-16 (×11): qty 1

## 2015-01-16 MED ORDER — HYDROMORPHONE HCL 1 MG/ML IJ SOLN
1.0000 mg | Freq: Once | INTRAMUSCULAR | Status: AC
  Administered 2015-01-16: 1 mg via INTRAVENOUS
  Filled 2015-01-16: qty 1

## 2015-01-16 NOTE — ED Provider Notes (Signed)
CSN: [id number]     Arrival date & time 01/16/15  1351 History   First MD Initiated Contact with Patient 01/16/15 1354     Chief Complaint  Patient presents with  . Leg Injury     (Consider location/radiation/quality/duration/timing/severity/associated sxs/prior Treatment) The history is provided by the patient and a relative. No language interpreter was used.  Kristin Griffin is a 64 y.o female with a history of hypertension and recently diagnosed with stage IV non-small cell lung cancer, adenocarcinoma with 2 large brain metastasis in addition to bone and left renal upper lobe lesion who presents after having falling while trying to sit down in the car. She states that her bottom missed the seat and she fell onto her leg bruising her right knee. No treatment prior to arrival. She states she takes oxycodone twice a day for pain. She denies any numbness or tingling to the leg. Past Medical History  Diagnosis Date  . Hypertension    Past Surgical History  Procedure Laterality Date  . Abdominal hysterectomy    . Video bronchoscopy with endobronchial navigation N/A 01/08/2015    Procedure: VIDEO BRONCHOSCOPY WITH ENDOBRONCHIAL NAVIGATION;  Surgeon: Collene Gobble, MD;  Location: [place];  Service: Thoracic;  Laterality: N/A;   No family history on file. Social History  Substance Use Topics  . Smoking status: Former Smoker -- 0.50 packs/day    Quit date: 10/22/2013  . Smokeless tobacco: None  . Alcohol Use: Yes     Comment: daily   OB History    No data available     Review of Systems  Musculoskeletal: Positive for joint swelling, arthralgias and gait problem.  Neurological: Negative for numbness.  All other systems reviewed and are negative.     Allergies  Review of patient's allergies indicates no known allergies.  Home Medications   Prior to Admission medications   Medication Sig Start Date End Date Taking? Authorizing Provider  alendronate (FOSAMAX) 70 MG tablet Take 1  tablet (70 mg total) by mouth every 7 (seven) days. Take with a full glass of water on an empty stomach. Patient not taking: Reported on 01/01/2015 12/21/13   Shawnee Knapp, MD  bisacodyl (DULCOLAX) 10 MG suppository Place 1 suppository (10 mg total) rectally daily. 01/12/15   Ripudeep Krystal Eaton, MD  calcium carbonate (OS-CAL) 600 MG TABS tablet Take 600 mg by mouth daily with breakfast.     Historical Provider, MD  dexamethasone (DECADRON) 4 MG tablet Take 1 tablet (4 mg total) by mouth every 6 (six) hours. 01/12/15   Ripudeep Krystal Eaton, MD  docusate sodium (COLACE) 100 MG capsule Take 1 capsule (100 mg total) by mouth 2 (two) times daily. 01/12/15   Ripudeep Krystal Eaton, MD  feeding supplement, ENSURE ENLIVE, (ENSURE ENLIVE) LIQD Take 237 mLs by mouth 2 (two) times daily between meals. 01/12/15   Ripudeep Krystal Eaton, MD  insulin aspart (NOVOLOG) 100 UNIT/ML injection Inject 0-15 Units into the skin 3 (three) times daily with meals. Sliding scale  CBG 70 - 120: 0 units: CBG 121 - 150: 2 units; CBG 151 - 200: 3 units; CBG 201 - 250: 5 units; CBG 251 - 300: 8 units;CBG 301 - 350: 11 units; CBG 351 - 400: 15 units; CBG > 400 : 15 units and notify MD 01/12/15   Ripudeep Krystal Eaton, MD  insulin aspart (NOVOLOG) 100 UNIT/ML injection Inject 3 Units into the skin 3 (three) times daily with meals. 01/12/15   Ripudeep Krystal Eaton,  MD  lisinopril-hydrochlorothiazide (PRINZIDE,ZESTORETIC) 20-25 MG per tablet take 1 tablet by mouth once daily 11/16/14   Shawnee Knapp, MD  Multiple Vitamin (MULTIVITAMIN) capsule Take 1 capsule by mouth daily.    Historical Provider, MD  Omega-3 Fatty Acids (FISH OIL) 1000 MG CAPS Take by mouth.    Historical Provider, MD  ondansetron (ZOFRAN) 4 MG tablet Take 1 tablet (4 mg total) by mouth every 6 (six) hours as needed for nausea. 01/12/15   Ripudeep Krystal Eaton, MD  oxyCODONE (OXY IR/ROXICODONE) 5 MG immediate release tablet Take 1 tablet (5 mg total) by mouth every 6 (six) hours as needed for moderate pain. 01/12/15   Ripudeep Krystal Eaton, MD  pantoprazole (PROTONIX) 40 MG tablet Take 1 tablet (40 mg total) by mouth daily. 01/12/15   Ripudeep Krystal Eaton, MD  polyethylene glycol (MIRALAX / GLYCOLAX) packet Take 17 g by mouth 2 (two) times daily. 01/12/15   Ripudeep Krystal Eaton, MD  verapamil (CALAN-SR) 240 MG CR tablet TAKE 1 TABLET BY MOUTH AT BEDTIME 11/18/14   Chelle Jeffery, PA-C   BP 160/86 mmHg  Pulse 83  Temp(Src) 98.1 F (36.7 C) (Oral)  Resp 18  Ht '5\' 5"'$  (1.651 m)  Wt 116 lb (52.617 kg)  BMI 19.30 kg/m2  SpO2 97% Physical Exam  Constitutional: She is oriented to person, place, and time. She appears well-developed and well-nourished. No distress.  HENT:  Head: Normocephalic and atraumatic.  Eyes: Conjunctivae are normal.  Neck: Normal range of motion. Neck supple.  Cardiovascular: Normal rate.   Pulmonary/Chest: Effort normal. No respiratory distress.  Abdominal: Soft. There is no tenderness.  Musculoskeletal:  Right lower leg: Significant swelling and ecchymosis to the proximal tibia and fibula. No knee effusion. 2+ DP pulse. Able to flex toes. Pain with plantar flexion and dorsiflexion of the foot. Lower leg is externally rotated.  Neurological: She is alert and oriented to person, place, and time.  Skin: Skin is warm and dry.  Nursing note and vitals reviewed.   ED Course  Procedures (including critical care time) Labs Review Labs Reviewed  CBC WITH DIFFERENTIAL/PLATELET - Abnormal; Notable for the following:    WBC 14.9 (*)    Platelets 437 (*)    Neutro Abs 13.7 (*)    Lymphs Abs 0.6 (*)    All other components within normal limits  BASIC METABOLIC PANEL - Abnormal; Notable for the following:    Sodium 133 (*)    Chloride 98 (*)    BUN 22 (*)    Calcium 8.8 (*)    All other components within normal limits    Imaging Review Dg Tibia/fibula Right  01/16/2015   CLINICAL DATA:  Car door slammed into right leg.  EXAM: RIGHT TIBIA AND FIBULA - 2 VIEW  COMPARISON:  None.  FINDINGS: Comminuted and  displaced fractures involving the proximal tibia and fibula. There is mild lateral displacement of the tibial fracture. Tibial fracture does not clearly involve the tibial plateau and there is no evidence for a knee joint effusion. The knee is located. The distal tibia and fibula are intact. No gross abnormality to the ankle.  IMPRESSION: Comminuted and displaced fractures of the proximal tibia and fibula.   Electronically Signed   By: Markus Daft M.D.   On: 01/16/2015 14:50   Dg Knee Complete 4 Views Right  01/16/2015   CLINICAL DATA:  Right knee and proximal tib-fib pain, bruising. Leg slammed by car door.  EXAM: RIGHT KNEE - COMPLETE  4+ VIEW  COMPARISON:  None.  FINDINGS: There is a comminuted fracture through the proximal right tibial metaphysis. Fracture fragments are moderately displaced. Note definite extension into the knee joint. No joint effusion. There is also a fracture through the right fibular neck.  IMPRESSION: Comminuted proximal right tibial metaphyseal fracture with mild displacement.  Right fibular neck fracture.   Electronically Signed   By: Rolm Baptise M.D.   On: 01/16/2015 14:49     EKG Interpretation None      MDM   Final diagnoses:  Fracture of neck of fibula, right, closed, initial encounter  Closed right tibial fracture, initial encounter  Patient presents for proximal lower extremity swelling after fall. She is unable to bear weight on the leg and has significant swelling and ecchymosis of the proximal tibia and fibula. X-ray shows comminuted proximal right tibial metaphyseal fracture with mild displacement and right fibular neck fracture. She is currently neurovascularly intact. I spoke to Dr. Ninfa Linden with orthopedics who requested a CT of the knee and stated that he would see the patient. '1mg'$  Dilaudid was ordered when necessary every hour. I discussed this patient with the resident and explained that the plan was to call Dr. Ninfa Linden once the CT resulted.   Ottie Glazier, PA-C 01/16/15 1600  Dr. Ninfa Linden is in the ED and recommended hospitalist to admit due to multiple comorbidities.  He stated he would do surgery tomorrow.   I spoke to the hospitalist who agreed to admit to medsurg.   Ottie Glazier, PA-C 01/16/15 1628  Lacretia Leigh, MD 01/19/15 1247

## 2015-01-16 NOTE — Consult Note (Signed)
Reason for Consult:  Right proximal tibia fracture Referring Physician: EDP  Kristin Griffin is an 64 y.o. female.  HPI:   64 yo female recently diagnosed with stage IV lung cancer and brain mets.  She was right-sided weakness and sustained a mechanical fall in a parking lot today while getting into a car.  She was recently discharged from [facility] to skilled nursing.  She was brought to [facility] ED and found to have a right proximal tibia fracture.  Ortho is consulted for further evaluation and treatment.  She reoprts right knee/leg pain and denies foot numbness.  She appears comfortable.  Past Medical History  Diagnosis Date  . Hypertension     Past Surgical History  Procedure Laterality Date  . Abdominal hysterectomy    . Video bronchoscopy with endobronchial navigation N/A 01/08/2015    Procedure: VIDEO BRONCHOSCOPY WITH ENDOBRONCHIAL NAVIGATION;  Surgeon: Collene Gobble, MD;  Location: [place];  Service: Thoracic;  Laterality: N/A;    No family history on file.  Social History:  reports that she quit smoking about 14 months ago. She does not have any smokeless tobacco history on file. She reports that she drinks alcohol. She reports that she does not use illicit drugs.  Allergies: No Known Allergies  Medications: I have reviewed the patient's current medications.  Results for orders placed or performed during the hospital encounter of 01/16/15 (from the past 48 hour(s))  CBC with Differential     Status: Abnormal   Collection Time: 01/16/15  2:52 PM  Result Value Ref Range   WBC 14.9 (H) 4.0 - 10.5 K/uL   RBC 4.03 3.87 - 5.11 MIL/uL   Hemoglobin 13.0 12.0 - 15.0 g/dL   HCT 37.5 36.0 - 46.0 %   MCV 93.1 78.0 - 100.0 fL   MCH 32.3 26.0 - 34.0 pg   MCHC 34.7 30.0 - 36.0 g/dL   RDW 13.9 11.5 - 15.5 %   Platelets 437 (H) 150 - 400 K/uL   Neutrophils Relative % 92 %   Neutro Abs 13.7 (H) 1.7 - 7.7 K/uL   Lymphocytes Relative 4 %   Lymphs Abs 0.6 (L) 0.7 - 4.0 K/uL   Monocytes  Relative 4 %   Monocytes Absolute 0.6 0.1 - 1.0 K/uL   Eosinophils Relative 0 %   Eosinophils Absolute 0.0 0.0 - 0.7 K/uL   Basophils Relative 0 %   Basophils Absolute 0.0 0.0 - 0.1 K/uL  Basic metabolic panel     Status: Abnormal   Collection Time: 01/16/15  2:52 PM  Result Value Ref Range   Sodium 133 (L) 135 - 145 mmol/L   Potassium 4.6 3.5 - 5.1 mmol/L   Chloride 98 (L) 101 - 111 mmol/L   CO2 25 22 - 32 mmol/L   Glucose, Bld 94 65 - 99 mg/dL   BUN 22 (H) 6 - 20 mg/dL   Creatinine, Ser 0.56 0.44 - 1.00 mg/dL   Calcium 8.8 (L) 8.9 - 10.3 mg/dL   GFR calc non Af Amer >60 >60 mL/min   GFR calc Af Amer >60 >60 mL/min    Comment: (NOTE) The eGFR has been calculated using the CKD EPI equation. This calculation has not been validated in all clinical situations. eGFR's persistently <60 mL/min signify possible Chronic Kidney Disease.    Anion gap 10 5 - 15    Dg Tibia/fibula Right  01/16/2015   CLINICAL DATA:  Car door slammed into right leg.  EXAM: RIGHT TIBIA  AND FIBULA - 2 VIEW  COMPARISON:  None.  FINDINGS: Comminuted and displaced fractures involving the proximal tibia and fibula. There is mild lateral displacement of the tibial fracture. Tibial fracture does not clearly involve the tibial plateau and there is no evidence for a knee joint effusion. The knee is located. The distal tibia and fibula are intact. No gross abnormality to the ankle.  IMPRESSION: Comminuted and displaced fractures of the proximal tibia and fibula.   Electronically Signed   By: Markus Daft M.D.   On: 01/16/2015 14:50   Dg Knee Complete 4 Views Right  01/16/2015   CLINICAL DATA:  Right knee and proximal tib-fib pain, bruising. Leg slammed by car door.  EXAM: RIGHT KNEE - COMPLETE 4+ VIEW  COMPARISON:  None.  FINDINGS: There is a comminuted fracture through the proximal right tibial metaphysis. Fracture fragments are moderately displaced. Note definite extension into the knee joint. No joint effusion. There is  also a fracture through the right fibular neck.  IMPRESSION: Comminuted proximal right tibial metaphyseal fracture with mild displacement.  Right fibular neck fracture.   Electronically Signed   By: Rolm Baptise M.D.   On: 01/16/2015 14:49    ROS Blood pressure 160/86, pulse 83, temperature 98.1 F (36.7 C), temperature source Oral, resp. rate 18, height _0  (1.651 m), weight 52.617 kg (116 lb), SpO2 97 %. Physical Exam  Musculoskeletal:       Right knee: She exhibits decreased range of motion, swelling and deformity. Tenderness found. Lateral joint line tenderness noted.       Legs:  Her right foot is well-perfused and has good sensation. No evidence of compartment syndrome.   Assessment/Plan: Right proximal tibia fracture 1)  This fracture is unstable and will require surgery this week once her soft-tissue swelling subsides.  She will need to be strict non-weigh t bearing on that leg.  Given her recent lung cancer diagnosis witth brain mets and recent hospitalization, I feel that she needs a consult from [facility] for surgerical clearance and medical management.  Surgery will be this week. She is supporsed to get a brain MRI this week and start radiation therapy to her brain mets 01/27/15.  A CT scan is needed to assess the fracture for possible intra-articular extension and for surgical planning.  She can have a regular diet today and needs inpatient admission for close observation given the right leg swelling as well as pain management prior to surgery in the next few days.  Mcarthur Rossetti 01/16/2015, 4:28 PM

## 2015-01-16 NOTE — H&P (Signed)
History and Physical  ZILDA NO HAL:[id number] DOB: 1951/01/14 DOA: 01/16/2015  Referring physician: Dr. Ashok Cordia - EDP PCP: Delman Cheadle, MD   Chief Complaint: s/p mechanical fall > right proximal tibia fracture + fibular neck fracture  HPI: 64 y.o. female w/ a hx of HTN and Stage IV NSCLC (adneo) diagnosed earlier this month who presented to the ED w/ c/o leg pain after suffering a mechanical fall in a parking lot.  She denies syncope, CP, dizziness, or focal weakness beyond her known recent deficits.    On arrival to the ED, Xrays note a right proximal tibia fracture and a fibular neck fracture.  Orthopedics has seen the pt, and feels she will need eventual surgical correction of this wound, once her edema has improved.  Due to her medical comorbidities, [facility] is asked to care for the pt during her acute stay.    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, & Left renal upper lobe lesion - lung biopsy on 9/16, pathology confirmed adenocarcinoma - to f/u w/ Rad Onc for brain irradiation and [facility] for Chemo   Dehydration Gently hydrate - follow clinically  Hyponatremia Due to volume depletion - follow w/ IV resuscitation   Right sided weakness Due to brain mets - on decadron - stable since most recent admission   Protein-calorie malnutrition, severe with underlying malignancy  Hypertension  BP poorly controlled presently - resume home meds and follow, w/ pain control  Osteoporosis   Tobacco abuse   Code Status: FULL  DVT Prophylaxis: lovenox Family Communication: no family present at time of exam  Disposition Plan: admit med bed - hydrate - for eventual orthopedic surgery   Past Medical History  Diagnosis Date  . Hypertension    Past  Surgical History  Procedure Laterality Date  . Abdominal hysterectomy    . Video bronchoscopy with endobronchial navigation N/A 01/08/2015    Procedure: VIDEO BRONCHOSCOPY WITH ENDOBRONCHIAL NAVIGATION;  Surgeon: Collene Gobble, MD;  Location: [place];  Service: Thoracic;  Laterality: N/A;    No Known Allergies  Social Hx:  reports that she quit smoking about 14 months ago. She does not have any smokeless tobacco history on file. She reports that she drinks alcohol. She reports that she does not use illicit drugs.  Family Hx:  No family history on file.   Review of Systems:  Constitutional:  No Fevers, chills, fatigue.  HEENT:  No headaches, Difficulty swallowing,Tooth/dental problems,Sore throat,  No sneezing, itching, ear ache, nasal congestion, post nasal drip,  Cardio-vascular:  No chest pain, Orthopnea, PND, palpitations  GI:  No heartburn, indigestion, abdominal pain, nausea, vomiting, diarrhea, change in bowel habits, loss of appetite  Resp:  No excess mucus, no productive cough, No non-productive cough, No coughing up of blood.No change in color of mucus.No wheezing.No chest wall deformity  Skin:  no rash or lesions.  GU:  no dysuria, change in color of urine, no urgency or frequency. No flank pain.  Musculoskeletal:  No back pain.  Psych:  No change in mood or affect. No depression or anxiety. No memory loss.   Prior to Admission medications   Medication Sig Start Date End Date Taking? Authorizing Provider  alendronate (FOSAMAX) 70 MG tablet Take 1 tablet (70 mg total) by mouth every  7 (seven) days. Take with a full glass of water on an empty stomach. Patient taking differently: Take 70 mg by mouth every 7 (seven) days. Take with a full glass of water on an empty stomach on Thursdays 12/21/13  Yes Shawnee Knapp, MD  bisacodyl (DULCOLAX) 10 MG suppository Place 1 suppository (10 mg total) rectally daily. Patient taking differently: Place 10 mg rectally daily at 6 PM.  01/12/15   Yes Ripudeep K Rai, MD  calcium carbonate (OS-CAL) 600 MG TABS tablet Take 600 mg by mouth daily with breakfast.    Yes Historical Provider, MD  dexamethasone (DECADRON) 4 MG tablet Take 1 tablet (4 mg total) by mouth every 6 (six) hours. 01/12/15  Yes Ripudeep Krystal Eaton, MD  docusate sodium (COLACE) 100 MG capsule Take 1 capsule (100 mg total) by mouth 2 (two) times daily. 01/12/15  Yes Ripudeep Krystal Eaton, MD  feeding supplement, ENSURE ENLIVE, (ENSURE ENLIVE) LIQD Take 237 mLs by mouth 2 (two) times daily between meals. 01/12/15  Yes Ripudeep Krystal Eaton, MD  insulin aspart (NOVOLOG) 100 UNIT/ML injection Inject 3 Units into the skin 3 (three) times daily with meals. Patient taking differently: Inject 3 Units into the skin See admin instructions. For steroid induced hyperglycemia:  Inject 0-5 units subcutaneously BEFORE meals (CBG 0-150 0 units, >150 5 units) and inject 3 units subcutaneously 3 times daily WITH meals 01/12/15  Yes Ripudeep Krystal Eaton, MD  lisinopril-hydrochlorothiazide (PRINZIDE,ZESTORETIC) 20-25 MG per tablet take 1 tablet by mouth once daily 11/16/14  Yes Shawnee Knapp, MD  Multiple Vitamin (MULTIVITAMIN WITH MINERALS) TABS tablet Take 1 tablet by mouth daily.   Yes Historical Provider, MD  Omega-3 Fatty Acids (FISH OIL) 1000 MG CAPS Take 1,000 mg by mouth daily.    Yes Historical Provider, MD  ondansetron (ZOFRAN) 4 MG tablet Take 1 tablet (4 mg total) by mouth every 6 (six) hours as needed for nausea. 01/12/15  Yes Ripudeep Krystal Eaton, MD  oxyCODONE (OXY IR/ROXICODONE) 5 MG immediate release tablet Take 1 tablet (5 mg total) by mouth every 6 (six) hours as needed for moderate pain. 01/12/15  Yes Ripudeep Krystal Eaton, MD  pantoprazole (PROTONIX) 40 MG tablet Take 1 tablet (40 mg total) by mouth daily. 01/12/15  Yes Ripudeep Krystal Eaton, MD  polyethylene glycol (MIRALAX / GLYCOLAX) packet Take 17 g by mouth 2 (two) times daily. 01/12/15  Yes Ripudeep Krystal Eaton, MD  traMADol (ULTRAM) 50 MG tablet Take 50 mg by mouth every 8 (eight)  hours as needed (pain). 01/12/15  Yes Historical Provider, MD  verapamil (CALAN-SR) 240 MG CR tablet TAKE 1 TABLET BY MOUTH AT BEDTIME Patient taking differently: Take 240 mg by mouth at bedtime.  11/18/14  Yes Chelle Jeffery, PA-C  insulin aspart (NOVOLOG) 100 UNIT/ML injection Inject 0-15 Units into the skin 3 (three) times daily with meals. Sliding scale  CBG 70 - 120: 0 units: CBG 121 - 150: 2 units; CBG 151 - 200: 3 units; CBG 201 - 250: 5 units; CBG 251 - 300: 8 units;CBG 301 - 350: 11 units; CBG 351 - 400: 15 units; CBG > 400 : 15 units and notify MD Patient not taking: Reported on 01/16/2015 01/12/15   Ripudeep Krystal Eaton, MD    Physical Exam: Filed Vitals:   01/16/15 1515 01/16/15 1530 01/16/15 1600 01/16/15 1615  BP: 149/84 160/86 158/85 149/87  Pulse: 79 83 75 86  Temp:      TempSrc:      Resp:  Height:      Weight:      SpO2: 98% 97% 95% 95%    Wt Readings from Last 3 Encounters:  01/16/15 52.617 kg (116 lb)  01/03/15 47.7 kg (105 lb 2.6 oz)  02/03/14 51.166 kg (112 lb 12.8 oz)    General: No acute respiratory distress HEENT: Normocephalic,atraumatic, pupils equal round reactive to light and accommodation, extraocular muscles intact bilaterally, OC/OP clear, sclera nonicteric Lungs: Clear to auscultation bilaterally without wheezes or rhonchi, with good air movement throughout all fields Cardiovascular: Regular rate and rhythm without murmur gallop or rub normal S1 and S2 Abdomen: Nontender, nondistended, soft, bowel sounds present, no organomegaly, no rebound, no ascites Extremities: No significant cyanosis, clubbing, edema bilateral lower extremities Neurologic: Alert and oriented x4, cranial nerves II through XII intact bilaterally, 5 over 5 strength bilateral upper and lower extremities, no Babinski, intact to sensation of touch throughout  Labs on Admission:  Basic Metabolic Panel:  Recent Labs Lab 01/12/15 0845 01/16/15 1452  NA 137 133*  K 4.3 4.6  CL 104 98*    CO2 23 25  GLUCOSE 95 94  BUN 19 22*  CREATININE 0.60 0.56  CALCIUM 9.2 8.8*    CBC:  Recent Labs Lab 01/12/15 0845 01/16/15 1452  WBC 12.9* 14.9*  NEUTROABS  --  13.7*  HGB 14.8 13.0  HCT 41.5 37.5  MCV 91.2 93.1  PLT 403* 437*   CBG:  Recent Labs Lab 01/11/15 1723 01/11/15 2111 01/12/15 0816 01/12/15 1200 01/12/15 1659  GLUCAP 117* 119* 96 121* 119*    Radiological Exams on Admission: Dg Tibia/fibula Right  01/16/2015   CLINICAL DATA:  Car door slammed into right leg.  EXAM: RIGHT TIBIA AND FIBULA - 2 VIEW  COMPARISON:  None.  FINDINGS: Comminuted and displaced fractures involving the proximal tibia and fibula. There is mild lateral displacement of the tibial fracture. Tibial fracture does not clearly involve the tibial plateau and there is no evidence for a knee joint effusion. The knee is located. The distal tibia and fibula are intact. No gross abnormality to the ankle.  IMPRESSION: Comminuted and displaced fractures of the proximal tibia and fibula.   Electronically Signed   By: Markus Daft M.D.   On: 01/16/2015 14:50   Dg Knee Complete 4 Views Right  01/16/2015   CLINICAL DATA:  Right knee and proximal tib-fib pain, bruising. Leg slammed by car door.  EXAM: RIGHT KNEE - COMPLETE 4+ VIEW  COMPARISON:  None.  FINDINGS: There is a comminuted fracture through the proximal right tibial metaphysis. Fracture fragments are moderately displaced. Note definite extension into the knee joint. No joint effusion. There is also a fracture through the right fibular neck.  IMPRESSION: Comminuted proximal right tibial metaphyseal fracture with mild displacement.  Right fibular neck fracture.   Electronically Signed   By: Rolm Baptise M.D.   On: 01/16/2015 14:49    EKG: no new EKG obtained this admit   Time spent: 50+ mins  Cherene Altes, MD [facility] For Consults/Admissions - Flow Manager - [phone] Office  [phone] Pager [phone]  On-Call/Text  Page:      [email or web address]      password [email or web address]

## 2015-01-16 NOTE — Progress Notes (Signed)
Orthopedic Tech Progress Note Patient Details:  Kristin Griffin July 08, 1950 [id number] Applied Velcro knee immobilizer to RLE.  Pulses, sensation, motion intact before and after application.  Capillary refill less than 2 seconds before and after application. Ortho Devices Type of Ortho Device: Knee Immobilizer Ortho Device/Splint Location: RLE Ortho Device/Splint Interventions: Application   Darrol Poke 01/16/2015, 5:19 PM

## 2015-01-16 NOTE — ED Notes (Signed)
Pt placed into gown and on monitor upon arrival to room. Pt monitored by blood pressure and pulse ox.  

## 2015-01-16 NOTE — ED Notes (Addendum)
Patient states she was getting in the car and her right leg caught between the car and the door. Patient  Has some bruising to the right knee. Pedal pulses doppler. Patient able to move toes to right foot. Denies any sensation changes. Patient has some swelling noted bilateral states baseline due to steroids. Patient alert and oriented on arrival. Deformity noted to right leg.

## 2015-01-16 NOTE — ED Notes (Signed)
Pt placed on monitor upon return to room. Pt remains monitored by blood pressure and pulse ox. Pts family remains at bedside.

## 2015-01-17 DIAGNOSIS — S8290XA Unspecified fracture of unspecified lower leg, initial encounter for closed fracture: Secondary | ICD-10-CM

## 2015-01-17 DIAGNOSIS — I10 Essential (primary) hypertension: Secondary | ICD-10-CM

## 2015-01-17 DIAGNOSIS — M6289 Other specified disorders of muscle: Secondary | ICD-10-CM

## 2015-01-17 LAB — COMPREHENSIVE METABOLIC PANEL
ALBUMIN: 2.5 g/dL — AB (ref 3.5–5.0)
ALK PHOS: 52 U/L (ref 38–126)
ALT: 41 U/L (ref 14–54)
ANION GAP: 8 (ref 5–15)
AST: 26 U/L (ref 15–41)
BILIRUBIN TOTAL: 0.6 mg/dL (ref 0.3–1.2)
BUN: 19 mg/dL (ref 6–20)
CALCIUM: 8.3 mg/dL — AB (ref 8.9–10.3)
CO2: 27 mmol/L (ref 22–32)
Chloride: 99 mmol/L — ABNORMAL LOW (ref 101–111)
Creatinine, Ser: 0.68 mg/dL (ref 0.44–1.00)
GFR calc Af Amer: >60 mL/min (ref >60)
GLUCOSE: 118 mg/dL — AB (ref 65–99)
POTASSIUM: 3.8 mmol/L (ref 3.5–5.1)
Sodium: 134 mmol/L — ABNORMAL LOW (ref 135–145)
TOTAL PROTEIN: 4.9 g/dL — AB (ref 6.5–8.1)

## 2015-01-17 LAB — GLUCOSE, CAPILLARY
Glucose-Capillary: 88 mg/dL (ref 65–99)
Glucose-Capillary: 133 mg/dL — ABNORMAL HIGH (ref 65–99)
Glucose-Capillary: 153 mg/dL — ABNORMAL HIGH (ref 65–99)

## 2015-01-17 LAB — CBC
HCT: 36.4 % (ref 36.0–46.0)
Hemoglobin: 12.5 g/dL (ref 12.0–15.0)
MCH: 32.1 pg (ref 26.0–34.0)
MCHC: 34.3 g/dL (ref 30.0–36.0)
MCV: 93.6 fL (ref 78.0–100.0)
Platelets: 419 10*3/uL — ABNORMAL HIGH (ref 150–400)
RBC: 3.89 MIL/uL (ref 3.87–5.11)
RDW: 14 % (ref 11.5–15.5)
WBC: 21.9 10*3/uL — AB (ref 4.0–10.5)

## 2015-01-17 LAB — MRSA PCR SCREENING: MRSA BY PCR: NEGATIVE

## 2015-01-17 MED ORDER — SODIUM CHLORIDE 0.9 % IV BOLUS (SEPSIS)
500.0000 mL | Freq: Once | INTRAVENOUS | Status: AC
  Administered 2015-01-17: 500 mL via INTRAVENOUS

## 2015-01-17 MED ORDER — LISINOPRIL 20 MG PO TABS
20.0000 mg | ORAL_TABLET | Freq: Every day | ORAL | Status: DC
  Administered 2015-01-17 – 2015-01-19 (×3): 20 mg via ORAL
  Filled 2015-01-17 (×3): qty 1

## 2015-01-17 MED ORDER — SODIUM CHLORIDE 0.9 % IV SOLN
INTRAVENOUS | Status: DC
  Administered 2015-01-17 – 2015-01-18 (×2): via INTRAVENOUS

## 2015-01-17 NOTE — Progress Notes (Signed)
Spoke with patient at bedside.  Plan for surgery tomorrow if patient has been medically optimized.  NPO after midnight.  Hold anticoagulation.  No signs of compartment syndrome. Consent obtained.  Azucena Cecil, MD [place] 3:47 PM

## 2015-01-17 NOTE — Progress Notes (Signed)
Patient ID: Kristin Griffin, female   DOB: 30-Apr-1950, 64 y.o.   MRN: [id number] Her right leg is definitely too swollen for surgery today.  I instructed nursing to help with icing throughout the day today.  Will need surgery this week, likely Tuesday or Wednesday.  Can get follow-up Brain MRI as an inpatient this week (she was scheduled to have a brain MRI 9/28 to assess her brain mets).  Will be non-weight bearing on her right leg for likely the next 4-6 weeks minimum.  Will continue to follow closely and update about timing for surgery.  No evidence of compartment syndrome thus far.

## 2015-01-17 NOTE — Progress Notes (Signed)
[facility] Progress Note   [name]  BRA:[id number]  DOB: Sep 30, 1950  DOA: 01/16/2015 PCP: Delman Cheadle, MD  Brief narrative: Kristin Griffin is a 64 y.o. female with hypertension, stage IV non-small cell lung cancer-adenoma who presents after falling in the parking lot. The patient was residing in a skilled nursing facility for rehabilitation after recent hospital stay.   Subjective: Complains of pain in her right leg. 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 -check urine sodium and osmolality -Hold HCTZ    Protein-calorie malnutrition, severe -Add protein supplements  Leukocytosis -Likely from Decadron and possibly a stress response as well -No signs or symptoms of infection    Code Status:     Code Status Orders        Start     Ordered   01/16/15 1745  Full code   Continuous     01/16/15 1745     Disposition Plan: Return to skilled nursing facility when stable DVT prophylaxis: Lovenox Consultants: Orthopedic surgery  Antibiotics: Anti-infectives    None      Objective: Filed Weights   01/16/15 1343  Weight: 52.617 kg (116 lb)    Intake/Output Summary (Last 24 hours) at 01/17/15 1232 Last data filed at 01/17/15 0900  Gross per 24 hour  Intake    480 ml   Output      0 ml  Net    480 ml     Vitals Filed Vitals:   01/16/15 1615 01/16/15 1732 01/16/15 2055 01/17/15 0553  BP: 149/87 155/81 144/47 131/77  Pulse: 86 88 104 73  Temp:  99 F (37.2 C) 97.5 F (36.4 C) 98.7 F (37.1 C)  TempSrc:  Oral Oral Oral  Resp:  '16 17 18  '$ Height:      Weight:      SpO2: 95% 94% 96% 95%    Exam:  General:  Pt is alert, not in acute distress  HEENT: No icterus, No thrush, oral mucosa moist  Cardiovascular: regular rate and rhythm, S1/S2 No murmur  Respiratory: clear to auscultation bilaterally   Abdomen: Soft, +Bowel sounds, non tender, non distended, no guarding  MSK: No cyanosis or clubbing + edema and bruising of left leg extended from below the knee to just above the ankle  Data Reviewed: Basic Metabolic Panel:  Recent Labs Lab 01/12/15 0845 01/16/15 1452 01/17/15 0447  NA 137 133* 134*  K 4.3 4.6 3.8  CL 104 98* 99*  CO2 '23 25 27  '$ GLUCOSE 95 94 118*  BUN 19 22* 19  CREATININE 0.60 0.56 0.68  CALCIUM 9.2 8.8* 8.3*   Liver Function Tests:  Recent Labs Lab 01/17/15 0447  AST 26  ALT 41  ALKPHOS 52  BILITOT 0.6  PROT 4.9*  ALBUMIN 2.5*   No results for input(s): LIPASE, AMYLASE in the last 168 hours. No results for input(s): AMMONIA in the last 168 hours. CBC:  Recent Labs Lab 01/12/15 0845 01/16/15 1452 01/17/15 0447  WBC 12.9* 14.9* 21.9*  NEUTROABS  --  13.7*  --   HGB 14.8 13.0 12.5  HCT 41.5 37.5 36.4  MCV 91.2 93.1 93.6  PLT 403* 437* 419*   Cardiac Enzymes: No results for input(s): CKTOTAL, CKMB, CKMBINDEX, TROPONINI in the last 168 hours. BNP (last 3 results) No results for input(s): BNP in the last 8760 hours.  ProBNP (last 3 results) No results for input(s): PROBNP in the last 8760 hours.  CBG:  Recent Labs Lab 01/12/15 1659 01/16/15 1919 01/16/15 2157 01/17/15 0620 01/17/15 1123  GLUCAP 119* 130* 135* 88 133*    Recent Results (from the past 240 hour(s))  MRSA PCR Screening      Status: None   Collection Time: 01/17/15  7:23 AM  Result Value Ref Range Status   MRSA by PCR NEGATIVE NEGATIVE Final    Comment:        The GeneXpert MRSA Assay (FDA approved for NASAL specimens only), is one component of a comprehensive MRSA colonization surveillance program. It is not intended to diagnose MRSA infection nor to guide or monitor treatment for MRSA infections.      Studies: Dg Tibia/fibula Right  01/16/2015   CLINICAL DATA:  Car door slammed into right leg.  EXAM: RIGHT TIBIA AND FIBULA - 2 VIEW  COMPARISON:  None.  FINDINGS: Comminuted and displaced fractures involving the proximal tibia and fibula. There is mild lateral displacement of the tibial fracture. Tibial fracture does not clearly involve the tibial plateau and there is no evidence for a knee joint effusion. The knee is located. The distal tibia and fibula are intact. No gross abnormality to the ankle.  IMPRESSION: Comminuted and displaced fractures of the proximal tibia and fibula.   Electronically Signed   By: Markus Daft M.D.   On: 01/16/2015 14:50   Ct Knee Right Wo Contrast  01/16/2015   CLINICAL DATA:  Tib-fib fracture. Hit in the leg with slamming car door  EXAM: CT OF THE RIGHT KNEE WITHOUT CONTRAST  TECHNIQUE: Multidetector CT imaging of the right knee was performed according to the standard protocol. Multiplanar CT image reconstructions were also generated.  COMPARISON:  Plain films earlier today  FINDINGS: Comminuted fracture noted within the proximal tibial metaphysis with multiple fracture fragments. Fracture fragments are mildly displaced. No visible intra-articular extension. Mildly impacted femoral neck fracture. No femoral abnormality. No joint effusion. Vascular calcifications posterior to the left knee in the popliteal artery.  IMPRESSION: Comminuted, displaced proximal right tibial metaphyseal fracture. No intra-articular extension visualized.  Mildly impacted fibular neck fracture.    Electronically Signed   By: Rolm Baptise M.D.   On: 01/16/2015 17:05   Dg Knee Complete 4 Views Right  01/16/2015   CLINICAL DATA:  Right knee and proximal tib-fib pain, bruising. Leg slammed by car door.  EXAM: RIGHT KNEE - COMPLETE 4+ VIEW  COMPARISON:  None.  FINDINGS: There is a comminuted fracture through the proximal right tibial metaphysis. Fracture fragments are moderately displaced. Note definite extension into the knee joint. No joint effusion. There is also a fracture through the right fibular neck.  IMPRESSION: Comminuted proximal right tibial metaphyseal fracture with mild displacement.  Right fibular neck fracture.   Electronically Signed   By: Rolm Baptise M.D.   On: 01/16/2015 14:49    Scheduled Meds:  Scheduled Meds: . bisacodyl  10 mg Rectal q1800  . calcium carbonate  1 tablet Oral Q  breakfast  . dexamethasone  4 mg Oral 4 times per day  . docusate sodium  100 mg Oral BID  . enoxaparin (LOVENOX) injection  40 mg Subcutaneous Q24H  . feeding supplement (ENSURE ENLIVE)  237 mL Oral BID BM  . insulin aspart  0-5 Units Subcutaneous QHS  . insulin aspart  0-9 Units Subcutaneous TID WC  . multivitamin with minerals  1 tablet Oral Daily  . pantoprazole  40 mg Oral Daily  . polyethylene glycol  17 g Oral BID  . verapamil  240 mg Oral QHS   Continuous Infusions:   Time spent on care of this patient: 35 min   [place], MD 01/17/2015, 12:32 PM  LOS: 1 day   [facility] Office  [phone] Pager - Text Page per [email or web address] If 7PM-7AM, please contact night-coverage [email or web address]

## 2015-01-18 ENCOUNTER — Inpatient Hospital Stay (HOSPITAL_COMMUNITY): Payer: Medicare Other

## 2015-01-18 ENCOUNTER — Inpatient Hospital Stay (HOSPITAL_COMMUNITY): Payer: Medicare Other | Admitting: Anesthesiology

## 2015-01-18 ENCOUNTER — Encounter (HOSPITAL_COMMUNITY): Payer: Self-pay

## 2015-01-18 ENCOUNTER — Encounter (HOSPITAL_COMMUNITY)
Admission: EM | Disposition: A | Discharge status: Hospice | Payer: Self-pay | Source: Home / Self Care | Attending: Internal Medicine

## 2015-01-18 DIAGNOSIS — S8290XS Unspecified fracture of unspecified lower leg, sequela: Secondary | ICD-10-CM

## 2015-01-18 HISTORY — PX: ORIF TIBIA PLATEAU: SHX2132

## 2015-01-18 LAB — CBC
HEMATOCRIT: 37.2 % (ref 36.0–46.0)
Hemoglobin: 12.8 g/dL (ref 12.0–15.0)
MCH: 31.8 pg (ref 26.0–34.0)
MCHC: 34.4 g/dL (ref 30.0–36.0)
MCV: 92.5 fL (ref 78.0–100.0)
PLATELETS: 418 10*3/uL — AB (ref 150–400)
RBC: 4.02 MIL/uL (ref 3.87–5.11)
RDW: 13.8 % (ref 11.5–15.5)
WBC: 30.8 10*3/uL — ABNORMAL HIGH (ref 4.0–10.5)

## 2015-01-18 LAB — CBC WITH DIFFERENTIAL/PLATELET
Basophils Absolute: 0 10*3/uL (ref 0.0–0.1)
Basophils Relative: 0 %
Eosinophils Absolute: 0 10*3/uL (ref 0.0–0.7)
Eosinophils Relative: 0 %
HCT: 40.4 % (ref 36.0–46.0)
Hemoglobin: 14.1 g/dL (ref 12.0–15.0)
LYMPHS ABS: 0.3 10*3/uL — AB (ref 0.7–4.0)
LYMPHS PCT: 2 %
MCH: 32 pg (ref 26.0–34.0)
MCHC: 34.9 g/dL (ref 30.0–36.0)
MCV: 91.8 fL (ref 78.0–100.0)
MONOS PCT: 2 %
Monocytes Absolute: 0.3 10*3/uL (ref 0.1–1.0)
NEUTROS PCT: 96 %
Neutro Abs: 12.9 10*3/uL — ABNORMAL HIGH (ref 1.7–7.7)
PLATELETS: 427 10*3/uL — AB (ref 150–400)
RBC: 4.4 MIL/uL (ref 3.87–5.11)
RDW: 13.6 % (ref 11.5–15.5)
WBC: 13.5 10*3/uL — AB (ref 4.0–10.5)

## 2015-01-18 LAB — URINE MICROSCOPIC-ADD ON

## 2015-01-18 LAB — SURGICAL PCR SCREEN
MRSA, PCR: NEGATIVE
Staphylococcus aureus: POSITIVE — AB

## 2015-01-18 LAB — URINALYSIS, ROUTINE W REFLEX MICROSCOPIC
Glucose, UA: NEGATIVE mg/dL
Hgb urine dipstick: NEGATIVE
Ketones, ur: 15 mg/dL — AB
NITRITE: POSITIVE — AB
PH: 6 (ref 5.0–8.0)
Protein, ur: 30 mg/dL — AB
SPECIFIC GRAVITY, URINE: 1.029 (ref 1.005–1.030)
UROBILINOGEN UA: 1 mg/dL (ref 0.0–1.0)

## 2015-01-18 LAB — BASIC METABOLIC PANEL
Anion gap: 7 (ref 5–15)
BUN: 12 mg/dL (ref 6–20)
CHLORIDE: 99 mmol/L — AB (ref 101–111)
CO2: 27 mmol/L (ref 22–32)
CREATININE: 0.48 mg/dL (ref 0.44–1.00)
Calcium: 8.6 mg/dL — ABNORMAL LOW (ref 8.9–10.3)
GFR calc Af Amer: >60 mL/min (ref >60)
GFR calc non Af Amer: >60 mL/min (ref >60)
GLUCOSE: 125 mg/dL — AB (ref 65–99)
POTASSIUM: 4.3 mmol/L (ref 3.5–5.1)
SODIUM: 133 mmol/L — AB (ref 135–145)

## 2015-01-18 LAB — GLUCOSE, CAPILLARY
GLUCOSE-CAPILLARY: 120 mg/dL — AB (ref 65–99)
GLUCOSE-CAPILLARY: 91 mg/dL (ref 65–99)
Glucose-Capillary: 142 mg/dL — ABNORMAL HIGH (ref 65–99)
Glucose-Capillary: 133 mg/dL — ABNORMAL HIGH (ref 65–99)
Glucose-Capillary: 122 mg/dL — ABNORMAL HIGH (ref 65–99)

## 2015-01-18 LAB — OSMOLALITY, URINE: OSMOLALITY UR: 835 mosm/kg (ref 390–1090)

## 2015-01-18 LAB — SODIUM, URINE, RANDOM: SODIUM UR: 189 mmol/L

## 2015-01-18 SURGERY — OPEN REDUCTION INTERNAL FIXATION (ORIF) TIBIAL PLATEAU
Anesthesia: General | Laterality: Right

## 2015-01-18 MED ORDER — PHENYLEPHRINE HCL 10 MG/ML IJ SOLN
INTRAMUSCULAR | Status: DC | PRN
  Administered 2015-01-18 (×3): 80 ug via INTRAVENOUS
  Administered 2015-01-18: 120 ug via INTRAVENOUS
  Administered 2015-01-18: 80 ug via INTRAVENOUS

## 2015-01-18 MED ORDER — CEFAZOLIN SODIUM-DEXTROSE 2-3 GM-% IV SOLR
2.0000 g | Freq: Four times a day (QID) | INTRAVENOUS | Status: AC
  Administered 2015-01-18 – 2015-01-19 (×3): 2 g via INTRAVENOUS
  Filled 2015-01-18 (×3): qty 50

## 2015-01-18 MED ORDER — ONDANSETRON HCL 4 MG PO TABS: 4.0000 mg | ORAL_TABLET | Freq: Four times a day (QID) | ORAL | Status: DC | PRN

## 2015-01-18 MED ORDER — OXYCODONE HCL 5 MG PO TABS: 5.0000 mg | ORAL_TABLET | ORAL | Status: DC | PRN

## 2015-01-18 MED ORDER — MEPERIDINE HCL 25 MG/ML IJ SOLN: 6.2500 mg | INTRAMUSCULAR | Status: DC | PRN

## 2015-01-18 MED ORDER — HYDROMORPHONE HCL 1 MG/ML IJ SOLN: 0.2500 mg | INTRAMUSCULAR | Status: DC | PRN

## 2015-01-18 MED ORDER — ALUM & MAG HYDROXIDE-SIMETH 200-200-20 MG/5ML PO SUSP: 30.0000 mL | ORAL | Status: DC | PRN

## 2015-01-18 MED ORDER — 0.9 % SODIUM CHLORIDE (POUR BTL) OPTIME
TOPICAL | Status: DC | PRN
  Administered 2015-01-18: 400 mL

## 2015-01-18 MED ORDER — ALBUMIN HUMAN 5 % IV SOLN
INTRAVENOUS | Status: DC | PRN
  Administered 2015-01-18: 16:00:00 via INTRAVENOUS

## 2015-01-18 MED ORDER — ACETAMINOPHEN 650 MG RE SUPP: 650.0000 mg | Freq: Four times a day (QID) | RECTAL | Status: DC | PRN

## 2015-01-18 MED ORDER — FENTANYL CITRATE (PF) 250 MCG/5ML IJ SOLN
INTRAMUSCULAR | Status: AC
  Filled 2015-01-18: qty 5

## 2015-01-18 MED ORDER — MIDAZOLAM HCL 2 MG/2ML IJ SOLN
INTRAMUSCULAR | Status: AC
  Filled 2015-01-18: qty 4

## 2015-01-18 MED ORDER — METOCLOPRAMIDE HCL 5 MG PO TABS
5.0000 mg | ORAL_TABLET | Freq: Three times a day (TID) | ORAL | Status: DC | PRN
  Filled 2015-01-18: qty 2

## 2015-01-18 MED ORDER — PHENYLEPHRINE HCL 10 MG/ML IJ SOLN
10.0000 mg | INTRAVENOUS | Status: DC | PRN
  Administered 2015-01-18: 25 ug/min via INTRAVENOUS

## 2015-01-18 MED ORDER — ROCURONIUM BROMIDE 50 MG/5ML IV SOLN
INTRAVENOUS | Status: AC
  Filled 2015-01-18: qty 1

## 2015-01-18 MED ORDER — FENTANYL CITRATE (PF) 100 MCG/2ML IJ SOLN
INTRAMUSCULAR | Status: DC | PRN
  Administered 2015-01-18: 50 ug via INTRAVENOUS
  Administered 2015-01-18: 25 ug via INTRAVENOUS
  Administered 2015-01-18: 50 ug via INTRAVENOUS

## 2015-01-18 MED ORDER — ACETAMINOPHEN 325 MG PO TABS
650.0000 mg | ORAL_TABLET | Freq: Four times a day (QID) | ORAL | Status: DC | PRN
  Administered 2015-01-19: 650 mg via ORAL

## 2015-01-18 MED ORDER — ACETAMINOPHEN 10 MG/ML IV SOLN
INTRAVENOUS | Status: DC | PRN
  Administered 2015-01-18: 1000 mg via INTRAVENOUS

## 2015-01-18 MED ORDER — MENTHOL 3 MG MT LOZG: 1.0000 | LOZENGE | OROMUCOSAL | Status: DC | PRN

## 2015-01-18 MED ORDER — ONDANSETRON HCL 4 MG/2ML IJ SOLN
INTRAMUSCULAR | Status: AC
  Filled 2015-01-18: qty 2

## 2015-01-18 MED ORDER — MORPHINE SULFATE (PF) 2 MG/ML IV SOLN: 0.5000 mg | INTRAVENOUS | Status: DC | PRN

## 2015-01-18 MED ORDER — DEXTROSE 5 % IV SOLN
500.0000 mg | Freq: Four times a day (QID) | INTRAVENOUS | Status: DC | PRN
  Filled 2015-01-18: qty 5

## 2015-01-18 MED ORDER — PROPOFOL 10 MG/ML IV BOLUS
INTRAVENOUS | Status: AC
  Filled 2015-01-18: qty 20

## 2015-01-18 MED ORDER — FENTANYL CITRATE (PF) 100 MCG/2ML IJ SOLN
INTRAMUSCULAR | Status: AC
  Filled 2015-01-18: qty 2

## 2015-01-18 MED ORDER — SODIUM CHLORIDE 0.9 % IV SOLN
INTRAVENOUS | Status: DC
  Administered 2015-01-18: 125 mL/h via INTRAVENOUS
  Administered 2015-01-19 – 2015-01-20 (×3): via INTRAVENOUS

## 2015-01-18 MED ORDER — GLYCOPYRROLATE 0.2 MG/ML IJ SOLN
INTRAMUSCULAR | Status: DC | PRN
  Administered 2015-01-18: .2 mg via INTRAVENOUS

## 2015-01-18 MED ORDER — EPHEDRINE SULFATE 50 MG/ML IJ SOLN
INTRAMUSCULAR | Status: DC | PRN
  Administered 2015-01-18: 10 mg via INTRAVENOUS

## 2015-01-18 MED ORDER — PHENOL 1.4 % MT LIQD: 1.0000 | OROMUCOSAL | Status: DC | PRN

## 2015-01-18 MED ORDER — ONDANSETRON HCL 4 MG/2ML IJ SOLN
4.0000 mg | Freq: Four times a day (QID) | INTRAMUSCULAR | Status: DC | PRN
  Administered 2015-01-19: 4 mg via INTRAVENOUS
  Filled 2015-01-18: qty 2

## 2015-01-18 MED ORDER — METOCLOPRAMIDE HCL 5 MG/ML IJ SOLN: 5.0000 mg | Freq: Three times a day (TID) | INTRAMUSCULAR | Status: DC | PRN

## 2015-01-18 MED ORDER — LACTATED RINGERS IV SOLN
INTRAVENOUS | Status: DC
  Administered 2015-01-18 – 2015-01-19 (×5): via INTRAVENOUS

## 2015-01-18 MED ORDER — ONDANSETRON HCL 4 MG/2ML IJ SOLN
INTRAMUSCULAR | Status: DC | PRN
  Administered 2015-01-18: 4 mg via INTRAVENOUS

## 2015-01-18 MED ORDER — ESMOLOL HCL 10 MG/ML IV SOLN
INTRAVENOUS | Status: DC | PRN
  Administered 2015-01-18: 20 mg via INTRAVENOUS

## 2015-01-18 MED ORDER — HYDROCODONE-ACETAMINOPHEN 5-325 MG PO TABS: 1.0000 | ORAL_TABLET | Freq: Four times a day (QID) | ORAL | Status: DC | PRN

## 2015-01-18 MED ORDER — LIDOCAINE HCL (CARDIAC) 20 MG/ML IV SOLN
INTRAVENOUS | Status: DC | PRN
  Administered 2015-01-18: 60 mg via INTRAVENOUS
  Administered 2015-01-18: 40 mg via INTRAVENOUS

## 2015-01-18 MED ORDER — LIDOCAINE HCL (CARDIAC) 20 MG/ML IV SOLN
INTRAVENOUS | Status: AC
  Filled 2015-01-18: qty 5

## 2015-01-18 MED ORDER — ACETAMINOPHEN 10 MG/ML IV SOLN
INTRAVENOUS | Status: AC
  Filled 2015-01-18: qty 100

## 2015-01-18 MED ORDER — MIDAZOLAM HCL 2 MG/2ML IJ SOLN
INTRAMUSCULAR | Status: AC
  Filled 2015-01-18: qty 2

## 2015-01-18 MED ORDER — ROCURONIUM BROMIDE 100 MG/10ML IV SOLN
INTRAVENOUS | Status: DC | PRN
  Administered 2015-01-18: 40 mg via INTRAVENOUS

## 2015-01-18 MED ORDER — NEOSTIGMINE METHYLSULFATE 10 MG/10ML IV SOLN
INTRAVENOUS | Status: DC | PRN
  Administered 2015-01-18: 3 mg via INTRAVENOUS

## 2015-01-18 MED ORDER — CEFAZOLIN SODIUM-DEXTROSE 2-3 GM-% IV SOLR
INTRAVENOUS | Status: DC | PRN
  Administered 2015-01-18: 2 g via INTRAVENOUS

## 2015-01-18 MED ORDER — ENOXAPARIN SODIUM 40 MG/0.4ML ~~LOC~~ SOLN
40.0000 mg | SUBCUTANEOUS | Status: DC
  Administered 2015-01-19: 40 mg via SUBCUTANEOUS
  Filled 2015-01-18: qty 0.4

## 2015-01-18 MED ORDER — PROPOFOL 10 MG/ML IV BOLUS
INTRAVENOUS | Status: DC | PRN
  Administered 2015-01-18: 100 mg via INTRAVENOUS

## 2015-01-18 MED ORDER — ONDANSETRON HCL 4 MG/2ML IJ SOLN: 4.0000 mg | Freq: Once | INTRAMUSCULAR | Status: DC | PRN

## 2015-01-18 MED ORDER — METHOCARBAMOL 500 MG PO TABS
500.0000 mg | ORAL_TABLET | Freq: Four times a day (QID) | ORAL | Status: DC | PRN
  Administered 2015-01-19: 500 mg via ORAL
  Filled 2015-01-18 (×2): qty 1

## 2015-01-18 MED ORDER — MIDAZOLAM HCL 5 MG/5ML IJ SOLN
INTRAMUSCULAR | Status: DC | PRN
  Administered 2015-01-18 (×2): 1 mg via INTRAVENOUS

## 2015-01-18 SURGICAL SUPPLY — 68 items
BANDAGE ELASTIC 6 VELCRO ST LF (GAUZE/BANDAGES/DRESSINGS) ×3 IMPLANT
BANDAGE ESMARK 6X9 LF (GAUZE/BANDAGES/DRESSINGS) ×1 IMPLANT
BIT DRILL 2.7 QC 7.9IN LONG (BIT) ×6 IMPLANT
BLADE SURG ROTATE 9660 (MISCELLANEOUS) IMPLANT
BNDG COHESIVE 6X5 TAN STRL LF (GAUZE/BANDAGES/DRESSINGS) ×3 IMPLANT
BNDG ESMARK 6X9 LF (GAUZE/BANDAGES/DRESSINGS) ×3
COVER SURGICAL LIGHT HANDLE (MISCELLANEOUS) ×3 IMPLANT
DRAPE C-ARM 42X72 X-RAY (DRAPES) ×3 IMPLANT
DRAPE C-ARMOR (DRAPES) ×3 IMPLANT
DRAPE IMP U-DRAPE 54X76 (DRAPES) ×3 IMPLANT
DRAPE INCISE IOBAN 66X45 STRL (DRAPES) ×3 IMPLANT
DRAPE ORTHO SPLIT 77X108 STRL (DRAPES) ×6
DRAPE PROXIMA HALF (DRAPES) ×3 IMPLANT
DRAPE SURG ORHT 6 SPLT 77X108 (DRAPES) ×3 IMPLANT
DRAPE U-SHAPE 47X51 STRL (DRAPES) ×3 IMPLANT
DRSG PAD ABDOMINAL 8X10 ST (GAUZE/BANDAGES/DRESSINGS) ×3 IMPLANT
DURAPREP 26ML APPLICATOR (WOUND CARE) ×3 IMPLANT
ELECT REM PT RETURN 9FT ADLT (ELECTROSURGICAL) ×3
ELECTRODE REM PT RTRN 9FT ADLT (ELECTROSURGICAL) ×1 IMPLANT
FACESHIELD STD STERILE (MASK) ×3 IMPLANT
GAUZE XEROFORM 1X8 LF (GAUZE/BANDAGES/DRESSINGS) ×3 IMPLANT
GAUZE XEROFORM 5X9 LF (GAUZE/BANDAGES/DRESSINGS) ×3 IMPLANT
GLOVE NEODERM STRL 7.5 LF PF (GLOVE) ×1 IMPLANT
GLOVE SURG NEODERM 7.5  LF PF (GLOVE) ×2
GLOVE SURG SYN 7.5  E (GLOVE) ×2
GLOVE SURG SYN 7.5 E (GLOVE) ×1 IMPLANT
GOWN STRL REIN XL XLG (GOWN DISPOSABLE) ×9 IMPLANT
K-WIRE 1.6 (WIRE) ×2
K-WIRE FX150X1.6XTROC PNT (WIRE) ×1
KIT BASIN OR (CUSTOM PROCEDURE TRAY) ×3 IMPLANT
KIT ROOM TURNOVER OR (KITS) ×3 IMPLANT
KWIRE FX150X1.6XTROC PNT (WIRE) ×1 IMPLANT
MANIFOLD NEPTUNE II (INSTRUMENTS) ×3 IMPLANT
NDL SUT 6 .5 CRC .975X.05 MAYO (NEEDLE) IMPLANT
NEEDLE MAYO TAPER (NEEDLE)
NS IRRIG 1000ML POUR BTL (IV SOLUTION) ×3 IMPLANT
PACK GENERAL/GYN (CUSTOM PROCEDURE TRAY) ×3 IMPLANT
PAD ABD 8X10 STRL (GAUZE/BANDAGES/DRESSINGS) ×3 IMPLANT
PAD ARMBOARD 7.5X6 YLW CONV (MISCELLANEOUS) ×6 IMPLANT
PADDING CAST COTTON 6X4 STRL (CAST SUPPLIES) ×3 IMPLANT
PLATE TIBIA 13HOLE RT L-P (Plate) ×3 IMPLANT
SCREW 3.5X48 (Screw) ×3 IMPLANT
SCREW 42 (Screw) ×3 IMPLANT
SCREW CANC FT 4.0X75MM (Screw) ×3 IMPLANT
SCREW CANC PERI-LOC 4.0X70MM (Screw) ×3 IMPLANT
SCREW CANCELLOUS 4.0X75 (Screw) ×6 IMPLANT
SCREW CORTEX 3.5X32 (Screw) ×3 IMPLANT
SCREW LOCK 3.5X30 (Screw) ×2 IMPLANT
SCREW LOCK 3.5X34 (Screw) ×4 IMPLANT
SCREW LOCK 3.5X60 (Screw) ×3 IMPLANT
SCREW LOCK 3.5X65 (Screw) ×2 IMPLANT
SCREW LOCK 3.5X70 (Screw) ×3 IMPLANT
SCREW LOCK T20 30X3.5XST CORT (Screw) ×1 IMPLANT
SCREW LOCK T20 34X3.5XST CORT (Screw) ×2 IMPLANT
SCREW LOCK T20 65X3.5XST (Screw) ×1 IMPLANT
SCREW NL 3.5X26 (Screw) ×6 IMPLANT
SCREW NONLOCK 3.5X44 (Screw) ×3 IMPLANT
SPONGE GAUZE 4X4 12PLY STER LF (GAUZE/BANDAGES/DRESSINGS) ×3 IMPLANT
STAPLER VISISTAT 35W (STAPLE) IMPLANT
SUT ETHILON 2 0 FS 18 (SUTURE) IMPLANT
SUT ETHILON 3 0 PS 1 (SUTURE) ×3 IMPLANT
SUT PDS AB 0 CT 36 (SUTURE) IMPLANT
SUT VIC AB 0 CT1 27 (SUTURE) ×2
SUT VIC AB 0 CT1 27XBRD ANBCTR (SUTURE) ×1 IMPLANT
SUT VIC AB 2-0 CT1 27 (SUTURE)
SUT VIC AB 2-0 CT1 TAPERPNT 27 (SUTURE) IMPLANT
TOWEL OR 17X24 6PK STRL BLUE (TOWEL DISPOSABLE) ×3 IMPLANT
TOWEL OR 17X26 10 PK STRL BLUE (TOWEL DISPOSABLE) ×6 IMPLANT

## 2015-01-18 NOTE — H&P (Signed)

## 2015-01-18 NOTE — Anesthesia Procedure Notes (Signed)
Procedure Name: Intubation Date/Time: 01/18/2015 3:40 PM Performed by: Clearnce Sorrel Pre-anesthesia Checklist: Patient identified, Timeout performed, Emergency Drugs available, Suction available and Patient being monitored Patient Re-evaluated:Patient Re-evaluated prior to inductionOxygen Delivery Method: Circle system utilized Preoxygenation: Pre-oxygenation with 100% oxygen Intubation Type: IV induction Ventilation: Mask ventilation without difficulty Laryngoscope Size: Mac and 3 Grade View: Grade I Tube type: Oral Tube size: 7.5 mm Number of attempts: 1 Airway Equipment and Method: Stylet Placement Confirmation: ETT inserted through vocal cords under direct vision,  breath sounds checked- equal and bilateral and positive ETCO2 Secured at: 22 cm Tube secured with: Tape Dental Injury: Teeth and Oropharynx as per pre-operative assessment

## 2015-01-18 NOTE — Progress Notes (Signed)
[facility] Progress Note   Kristin Griffin  GPQ:[id number]  DOB: [birth year]  DOA: 01/16/2015 PCP: Delman Cheadle, MD  Brief narrative: Kristin Griffin is a 64 y.o. female with hypertension, stage IV non-small cell lung cancer-adenoma who presents after falling in the parking lot. The patient was residing in a skilled nursing facility for rehabilitation after recent hospital stay.   Subjective: She began to have abdominal pain last night after being giving a Dulcolax suppository. She states that she believes she is impacted. No fevers or chills. No complaints of nausea or vomiting.  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 and HCTZ use - urine sodium elevated -urine osmolality normal -Hold HCTZ    Protein-calorie malnutrition, severe -Add protein supplements  Leukocytosis -Likely from Decadron and possibly a stress response as well -No signs or symptoms of infection -WBC count improved to 13.5 today    Code Status:     Code Status Orders        Start     Ordered   01/16/15 1745  Full code   Continuous     01/16/15 1745     Disposition Plan: Return to skilled nursing facility when stable DVT prophylaxis:  Lovenox Consultants: Orthopedic surgery  Antibiotics: Anti-infectives    None      Objective: Filed Weights   01/16/15 1343  Weight: 52.617 kg (116 lb)    Intake/Output Summary (Last 24 hours) at 01/18/15 1308 Last data filed at 01/17/15 1751  Gross per 24 hour  Intake    360 ml  Output      0 ml  Net    360 ml     Vitals Filed Vitals:   01/17/15 0553 01/17/15 1415 01/17/15 2048 01/18/15 0617  BP: 131/77 147/76 155/93 167/97  Pulse: 73 98 116 114  Temp: 98.7 F (37.1 C) 98.6 F (37 C) 98.4 F (36.9 C) 97.8 F (36.6 C)  TempSrc: Oral Oral Oral Oral  Resp: '18 18 18 18  '$ Height:      Weight:      SpO2: 95% 95% 96% 96%    Exam:  General:  Pt is alert, not in acute distress  HEENT: No icterus, No thrush, oral mucosa moist  Cardiovascular: regular rate and rhythm, S1/S2 No murmur  Respiratory: clear to auscultation bilaterally   Abdomen: Soft, +Bowel sounds,  tender in suprapubic area left lower quadrant and left upper quadrant, non distended, no guarding  MSK: No cyanosis or clubbing + edema and bruising of left leg extended from below the knee to just above the ankle  Data Reviewed: Basic Metabolic Panel:  Recent Labs Lab 01/12/15 0845 01/16/15 1452 01/17/15 0447 01/18/15 0428  NA 137 133* 134* 133*  K 4.3 4.6 3.8 4.3  CL 104 98* 99* 99*  CO2 '23 25 27 '$ 27  GLUCOSE 95 94 118* 125*  BUN 19 22* 19 12  CREATININE 0.60 0.56 0.68 0.48  CALCIUM 9.2 8.8* 8.3* 8.6*   Liver Function Tests:  Recent Labs Lab 01/17/15 0447  AST 26  ALT 41  ALKPHOS 52  BILITOT 0.6  PROT 4.9*  ALBUMIN 2.5*   No results for input(s): LIPASE, AMYLASE in the last 168 hours. No results for input(s): AMMONIA in the last 168 hours. CBC:  Recent Labs Lab 01/12/15 0845 01/16/15 1452 01/17/15 0447 01/18/15 0428 01/18/15 0817  WBC 12.9* 14.9* 21.9* 30.8* 13.5*  NEUTROABS  --  13.7*  --   --  12.9*  HGB 14.8 13.0 12.5 12.8 14.1  HCT 41.5 37.5 36.4 37.2 40.4  MCV 91.2  93.1 93.6 92.5 91.8  PLT 403* 437* 419* 418* 427*   Cardiac Enzymes: No results for input(s): CKTOTAL, CKMB, CKMBINDEX, TROPONINI in the last 168 hours. BNP (last 3 results) No results for input(s): BNP in the last 8760 hours.  ProBNP (last 3 results) No results for input(s): PROBNP in the last 8760 hours.  CBG:  Recent Labs Lab 01/17/15 0620 01/17/15 1123 01/17/15 1617 01/18/15 0714 01/18/15 1133  GLUCAP 88 133* 153* 142* 133*    Recent Results (from the past 240 hour(s))  MRSA PCR Screening     Status: None   Collection Time: 01/17/15  7:23 AM  Result Value Ref Range Status   MRSA by PCR NEGATIVE NEGATIVE Final    Comment:        The GeneXpert MRSA Assay (FDA approved for NASAL specimens only), is one component of a comprehensive MRSA colonization surveillance program. It is not intended to diagnose MRSA infection nor to guide or monitor treatment for MRSA infections.   Surgical pcr screen     Status: Abnormal   Collection Time: 01/17/15  7:23 AM  Result Value Ref Range Status   MRSA, PCR NEGATIVE NEGATIVE Final   Staphylococcus aureus POSITIVE (A) NEGATIVE Final    Comment:        The Xpert SA Assay (FDA approved for NASAL specimens in patients over 87 years of age), is one component of a comprehensive surveillance program.  Test performance has been validated by [facility] for patients greater than or equal to 37 year old. It is not intended to diagnose infection nor to guide or monitor treatment.      Studies: Dg Tibia/fibula Right  01/16/2015   CLINICAL DATA:  Car door slammed into right leg.  EXAM: RIGHT TIBIA AND FIBULA - 2 VIEW  COMPARISON:  None.  FINDINGS: Comminuted and displaced fractures involving the proximal tibia and fibula. There is mild lateral displacement of the tibial fracture. Tibial fracture does not clearly involve the tibial plateau and there is no evidence for a knee joint effusion. The knee is located. The distal tibia and  fibula are intact. No gross abnormality to the ankle.  IMPRESSION: Comminuted and displaced fractures of the proximal tibia and fibula.   Electronically Signed   By: Markus Daft M.D.   On: 01/16/2015 14:50   Ct Knee Right Wo Contrast  01/16/2015   CLINICAL DATA:  Tib-fib fracture. Hit in the leg with slamming car door  EXAM: CT OF THE RIGHT KNEE WITHOUT CONTRAST  TECHNIQUE: Multidetector CT imaging of the right knee was performed according to the standard protocol. Multiplanar CT image reconstructions were also generated.  COMPARISON:  Plain films earlier today  FINDINGS: Comminuted fracture noted within the proximal tibial metaphysis with  multiple fracture fragments. Fracture fragments are mildly displaced. No visible intra-articular extension. Mildly impacted femoral neck fracture. No femoral abnormality. No joint effusion. Vascular calcifications posterior to the left knee in the popliteal artery.  IMPRESSION: Comminuted, displaced proximal right tibial metaphyseal fracture. No intra-articular extension visualized.  Mildly impacted fibular neck fracture.   Electronically Signed   By: Rolm Baptise M.D.   On: 01/16/2015 17:05   Dg Knee Complete 4 Views Right  01/16/2015   CLINICAL DATA:  Right knee and proximal tib-fib pain, bruising. Leg slammed by car door.  EXAM: RIGHT KNEE - COMPLETE 4+ VIEW  COMPARISON:  None.  FINDINGS: There is a comminuted fracture through the proximal right tibial metaphysis. Fracture fragments are moderately displaced. Note definite extension into the knee joint. No joint effusion. There is also a fracture through the right fibular neck.  IMPRESSION: Comminuted proximal right tibial metaphyseal fracture with mild displacement.  Right fibular neck fracture.   Electronically Signed   By: Rolm Baptise M.D.   On: 01/16/2015 14:49   Dg Abd Portable 1v  01/18/2015   CLINICAL DATA:  Onset of mid abdominal pain last night  EXAM: PORTABLE ABDOMEN - 1 VIEW  COMPARISON:  01/05/2015   FINDINGS: The bowel gas pattern is normal. No radio-opaque calculi or other significant radiographic abnormality are seen.  IMPRESSION: Nonobstructive bowel gas pattern.   Electronically Signed   By: Kerby Moors M.D.   On: 01/18/2015 11:15    Scheduled Meds:  Scheduled Meds: . bisacodyl  10 mg Rectal q1800  . calcium carbonate  1 tablet Oral Q breakfast  . dexamethasone  4 mg Oral 4 times per day  . docusate sodium  100 mg Oral BID  . feeding supplement (ENSURE ENLIVE)  237 mL Oral BID BM  . insulin aspart  0-5 Units Subcutaneous QHS  . insulin aspart  0-9 Units Subcutaneous TID WC  . lisinopril  20 mg Oral Daily  . multivitamin with minerals  1 tablet Oral Daily  . pantoprazole  40 mg Oral Daily  . verapamil  240 mg Oral QHS   Continuous Infusions: . sodium chloride 125 mL/hr at 01/18/15 0820    Time spent on care of this patient: 66 min   [place], MD 01/18/2015, 1:08 PM  LOS: 2 days   [facility] Office  [phone] Pager - Text Page per [email or web address] If 7PM-7AM, please contact night-coverage [email or web address]

## 2015-01-18 NOTE — Transfer of Care (Signed)
Immediate Anesthesia Transfer of Care Note  Patient: Kristin Griffin  Procedure(s) Performed: Procedure(s): OPEN REDUCTION INTERNAL FIXATION (ORIF) TIBIAL PLATEAU (Right)  Patient Location: PACU  Anesthesia Type:General  Level of Consciousness: awake, alert  and oriented  Airway & Oxygen Therapy: Patient Spontanous Breathing and Patient connected to face mask oxygen  Post-op Assessment: Report given to RN and Post -op Vital signs reviewed and stable  Post vital signs: Reviewed and stable  Last Vitals:  Filed Vitals:   01/18/15 1423  BP: 156/100  Pulse: 133  Temp: 36.3 C  Resp: 16    Complications: No apparent anesthesia complications

## 2015-01-18 NOTE — Progress Notes (Signed)
Patient's WBC is trending up.  She's been afebrile.  Appreciate hospitalist input on this matter prior to surgery today at 3 pm.  Thanks.  Kristin Cecil, MD [place] 7:34 AM

## 2015-01-18 NOTE — Progress Notes (Signed)
Utiliziation review completed

## 2015-01-18 NOTE — Clinical Social Work Note (Signed)
Clinical Social Work Assessment  Patient Details  Name: Kristin Griffin MRN: [id number] Date of Birth: 1950/05/21  Date of referral:  01/18/15               Reason for consult:  Facility Placement, Discharge Planning                Permission sought to share information with:  Facility Sport and exercise psychologist, Family Supports Permission granted to share information::     Name::        Agency::  [facility]  Relationship::     Contact Information:     Housing/Transportation Living arrangements for the past 2 months:  [place], [place] of Information:  Patient Patient Interpreter Needed:  None Criminal Activity/Legal Involvement Pertinent to Current Situation/Hospitalization:  No - Comment as needed Significant Relationships:  Other(Comment) (Patient did not disclose relationships at this time.) Lives with:  Facility Resident, Self Do you feel safe going back to the place where you live?   (Patient awaiting completion of surgery on 9/26 prior to deciding on whether to return to [facility].) Need for family participation in patient care:  No (Coment) (Patient able to make own decisions.)  Care giving concerns:  Patient expressed no concerns at this time.   Social Worker assessment / plan:  CSW received referral stating patient admitted from [facility]. CSW met with patient to discuss discharge disposition. Per patient, patient was only at [facility] for two nights prior to admitting to [facility]. Patient states that patient is unsure of discharge plan at this time. Per chart review, patient to undergo surgery on 01/18/2015. CSW to continue to follow and assist with discharge planning needs post-surgery.  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  Patient/Family's Understanding of and Emotional Response to Diagnosis, Current Treatment, and Prognosis:  Patient understanding and agreeable to CSW plan of care.  Emotional Assessment Appearance:  Appears stated age Attitude/Demeanor/Rapport:  Other (Pleasant.) Affect (typically observed):  Pleasant, Accepting, Appropriate Orientation:  Oriented to Self, Oriented to Place, Oriented to  Time, Oriented to Situation Alcohol / Substance use:  Not Applicable Psych involvement (Current and /or in the community):  No (Comment) (Not appropriate on this admission.)  Discharge Needs  Concerns to be addressed:  No discharge needs identified Readmission within the last 30 days:  No Current discharge risk:  None Barriers to Discharge:  No Barriers Identified   Caroline Sauger, LCSW 01/18/2015, 12:18 PM [phone]

## 2015-01-18 NOTE — Op Note (Signed)
   Date of Surgery: 01/18/2015  INDICATIONS: Ms. Shiflett is a 64 y.o.-year-old female who sustained a right proximal tibial metaphyseal fracture from a mechanical fall;  The patient did consent to the procedure after discussion of the risks and benefits.  PREOPERATIVE DIAGNOSIS: Right tibial metaphyseal fracture  POSTOPERATIVE DIAGNOSIS: Same.  PROCEDURE:  1. Open reduction internal fixation of right tibial metaphyseal fracture 2. Closed treatment of fibular head fracture  SURGEON: N. Eduard Roux, M.D.  ASSIST: Ky Barban, RNFA.  ANESTHESIA:  general  IV FLUIDS AND URINE: See anesthesia.  ESTIMATED BLOOD LOSS: minimal mL.  IMPLANTS: [employer] 3.5 mm proximal tibial plateau plate  DRAINS: none  COMPLICATIONS: None.  DESCRIPTION OF PROCEDURE: The patient was brought to the operating room and placed supine on the operating table.  The patient had been signed prior to the procedure and this was documented. The patient had the anesthesia placed by the anesthesiologist.  A time-out was performed to confirm that this was the correct patient, site, side and location. The patient had an SCD on the opposite lower extremity. The patient did receive antibiotics prior to the incision and was re-dosed during the procedure as needed at indicated intervals.  A nonsterile tourniquet was placed on the upper thigh.  The patient had the operative extremity prepped and draped in the standard surgical fashion.    A curvilinear incision on the lateral aspect of the knee and proximal tibia was made.  We were very careful with soft tissue handling given her frail skin.  Full thickness flaps were created and lifted off of the IT band.  The IT band was incised in line with the incision.  The IT band was sharply elevated off of Gerdy's tubercle.  The proximal tibia was exposed for placement of the plate.  A cobb elevator was slid down the lateral aspect of the tibia to create a pocket for the plate.  The  appropriate length of the plate was determined using fluoroscopy.  The plate was slid down the tibia to the appropriate position and held with 2 K wires.  Fluoroscopy was used to confirm the placement.  I then obtained a provisional reduction with translation and traction.  I then placed an apex screw in order to gain further reduction of the fracture.  I then placed 3 raftering screws in the proximal portion of the plate.  I then placed 4 nonlocking screws in the shaft percutaneously in order to bring the plate down to the bone.  A kickstand screw was then placed.  Final xrays were taken.  The wound was thoroughly irrigated and closed with 0 vicryl for the IT band, 2.0 vicryl for subcutaneous layer and 3.0 nylon for the skin.  Sterile dressings were applied and the leg was placed back in a knee immobilizer.  Of note the compartments remained soft.  All sponge counts were correct.  POSTOPERATIVE PLAN: The patient will be on lovenox for DVT prophylaxis and non weight bearing.  Azucena Cecil, MD [place] 7:26 PM

## 2015-01-18 NOTE — Anesthesia Preprocedure Evaluation (Addendum)
Anesthesia Evaluation  Patient identified by MRN, date of birth, ID band Patient awake    Reviewed: Allergy & Precautions, NPO status , Patient's Chart, lab work & pertinent test results, reviewed documented beta blocker date and time   Airway Mallampati: II  TM Distance: >3 FB Neck ROM: Full    Dental  (+) Edentulous Upper, Poor Dentition, Dental Advisory Given   Pulmonary former smoker,  Lung cancer with mets   Pulmonary exam normal        Cardiovascular hypertension, Pt. on home beta blockers Normal cardiovascular exam     Neuro/Psych    GI/Hepatic   Endo/Other    Renal/GU      Musculoskeletal   Abdominal   Peds  Hematology   Anesthesia Other Findings   Reproductive/Obstetrics                            Anesthesia Physical Anesthesia Plan  ASA: III  Anesthesia Plan: General   Post-op Pain Management:    Induction: Intravenous  Airway Management Planned: Oral ETT  Additional Equipment:   Intra-op Plan:   Post-operative Plan: Extubation in OR  Informed Consent: I have reviewed the patients History and Physical, chart, labs and discussed the procedure including the risks, benefits and alternatives for the proposed anesthesia with the patient or authorized representative who has indicated his/her understanding and acceptance.     Plan Discussed with: CRNA and Surgeon  Anesthesia Plan Comments:         Anesthesia Quick Evaluation

## 2015-01-19 ENCOUNTER — Inpatient Hospital Stay (HOSPITAL_COMMUNITY): Payer: Medicare Other

## 2015-01-19 ENCOUNTER — Encounter (HOSPITAL_COMMUNITY)
Admission: EM | Disposition: A | Discharge status: Hospice | Payer: Self-pay | Source: Home / Self Care | Attending: Internal Medicine

## 2015-01-19 ENCOUNTER — Inpatient Hospital Stay (HOSPITAL_COMMUNITY): Payer: Medicare Other | Admitting: Anesthesiology

## 2015-01-19 ENCOUNTER — Encounter (HOSPITAL_COMMUNITY): Payer: Self-pay | Admitting: Orthopaedic Surgery

## 2015-01-19 DIAGNOSIS — Z01818 Encounter for other preprocedural examination: Secondary | ICD-10-CM

## 2015-01-19 DIAGNOSIS — E871 Hypo-osmolality and hyponatremia: Secondary | ICD-10-CM

## 2015-01-19 DIAGNOSIS — R1084 Generalized abdominal pain: Secondary | ICD-10-CM

## 2015-01-19 DIAGNOSIS — C3430 Malignant neoplasm of lower lobe, unspecified bronchus or lung: Secondary | ICD-10-CM

## 2015-01-19 DIAGNOSIS — R109 Unspecified abdominal pain: Secondary | ICD-10-CM | POA: Insufficient documentation

## 2015-01-19 DIAGNOSIS — C7931 Secondary malignant neoplasm of brain: Secondary | ICD-10-CM

## 2015-01-19 HISTORY — PX: LAPAROTOMY: SHX154

## 2015-01-19 LAB — CBC
HCT: 35.9 % — ABNORMAL LOW (ref 36.0–46.0)
HEMATOCRIT: 27.2 % — AB (ref 36.0–46.0)
HEMOGLOBIN: 12.5 g/dL (ref 12.0–15.0)
HEMOGLOBIN: 9.4 g/dL — AB (ref 12.0–15.0)
MCH: 32.1 pg (ref 26.0–34.0)
MCH: 31.4 pg (ref 26.0–34.0)
MCHC: 34.8 g/dL (ref 30.0–36.0)
MCHC: 34.6 g/dL (ref 30.0–36.0)
MCV: 92.1 fL (ref 78.0–100.0)
MCV: 91 fL (ref 78.0–100.0)
Platelets: 283 10*3/uL (ref 150–400)
Platelets: 209 10*3/uL (ref 150–400)
RBC: 3.9 MIL/uL (ref 3.87–5.11)
RBC: 2.99 MIL/uL — ABNORMAL LOW (ref 3.87–5.11)
RDW: 14.1 % (ref 11.5–15.5)
RDW: 13.9 % (ref 11.5–15.5)
WBC: 9.1 10*3/uL (ref 4.0–10.5)
WBC: 3.3 10*3/uL — AB (ref 4.0–10.5)

## 2015-01-19 LAB — BLOOD GAS, ARTERIAL
ACID-BASE DEFICIT: 4.4 mmol/L — AB (ref 0.0–2.0)
BICARBONATE: 20.6 meq/L (ref 20.0–24.0)
DRAWN BY: 437071
FIO2: 0.5
LHR: 16 {breaths}/min
MECHVT: 470 mL
O2 SAT: 90.5 %
PATIENT TEMPERATURE: 99.2
PEEP: 5 cmH2O
PO2 ART: 69.2 mmHg — AB (ref 80.0–100.0)
TCO2: 21.8 mmol/L (ref 0–100)
pCO2 arterial: 41.2 mmHg (ref 35.0–45.0)
pH, Arterial: 7.322 — ABNORMAL LOW (ref 7.350–7.450)

## 2015-01-19 LAB — BASIC METABOLIC PANEL
ANION GAP: 8 (ref 5–15)
BUN: 25 mg/dL — ABNORMAL HIGH (ref 6–20)
CHLORIDE: 102 mmol/L (ref 101–111)
CO2: 24 mmol/L (ref 22–32)
Calcium: 8.2 mg/dL — ABNORMAL LOW (ref 8.9–10.3)
Creatinine, Ser: 0.93 mg/dL (ref 0.44–1.00)
GFR calc non Af Amer: >60 mL/min (ref >60)
Glucose, Bld: 92 mg/dL (ref 65–99)
Potassium: 4.8 mmol/L (ref 3.5–5.1)
Sodium: 134 mmol/L — ABNORMAL LOW (ref 135–145)

## 2015-01-19 LAB — URINE CULTURE: Culture: NO GROWTH

## 2015-01-19 LAB — GLUCOSE, CAPILLARY
GLUCOSE-CAPILLARY: 85 mg/dL (ref 65–99)
GLUCOSE-CAPILLARY: 107 mg/dL — AB (ref 65–99)
GLUCOSE-CAPILLARY: 109 mg/dL — AB (ref 65–99)
GLUCOSE-CAPILLARY: 94 mg/dL (ref 65–99)
Glucose-Capillary: 109 mg/dL — ABNORMAL HIGH (ref 65–99)
Glucose-Capillary: 97 mg/dL (ref 65–99)

## 2015-01-19 LAB — LACTIC ACID, PLASMA: Lactic Acid, Venous: 3.4 mmol/L (ref 0.5–2.0)

## 2015-01-19 SURGERY — LAPAROTOMY, EXPLORATORY
Anesthesia: General | Site: Abdomen

## 2015-01-19 MED ORDER — MIDAZOLAM HCL 2 MG/2ML IJ SOLN
INTRAMUSCULAR | Status: AC
  Filled 2015-01-19: qty 4

## 2015-01-19 MED ORDER — ROCURONIUM BROMIDE 50 MG/5ML IV SOLN
INTRAVENOUS | Status: AC
  Filled 2015-01-19: qty 1

## 2015-01-19 MED ORDER — IOHEXOL 300 MG/ML  SOLN: 25.0000 mL | INTRAMUSCULAR | Status: DC

## 2015-01-19 MED ORDER — FENTANYL CITRATE (PF) 100 MCG/2ML IJ SOLN: 50.0000 ug | INTRAMUSCULAR | Status: DC | PRN

## 2015-01-19 MED ORDER — FENTANYL CITRATE (PF) 100 MCG/2ML IJ SOLN
INTRAMUSCULAR | Status: DC | PRN
  Administered 2015-01-19 (×6): 50 ug via INTRAVENOUS

## 2015-01-19 MED ORDER — DEXAMETHASONE SODIUM PHOSPHATE 4 MG/ML IJ SOLN
4.0000 mg | Freq: Four times a day (QID) | INTRAMUSCULAR | Status: DC
  Administered 2015-01-19 – 2015-01-21 (×6): 4 mg via INTRAVENOUS
  Filled 2015-01-19 (×10): qty 1

## 2015-01-19 MED ORDER — ETOMIDATE 2 MG/ML IV SOLN
INTRAVENOUS | Status: DC | PRN
  Administered 2015-01-19: 10 mg via INTRAVENOUS

## 2015-01-19 MED ORDER — FENTANYL CITRATE (PF) 100 MCG/2ML IJ SOLN: 50.0000 ug | Freq: Once | INTRAMUSCULAR | Status: DC

## 2015-01-19 MED ORDER — PHENYLEPHRINE 40 MCG/ML (10ML) SYRINGE FOR IV PUSH (FOR BLOOD PRESSURE SUPPORT)
PREFILLED_SYRINGE | INTRAVENOUS | Status: AC
  Filled 2015-01-19: qty 10

## 2015-01-19 MED ORDER — PANTOPRAZOLE SODIUM 40 MG IV SOLR
40.0000 mg | INTRAVENOUS | Status: DC
  Administered 2015-01-19 – 2015-01-22 (×4): 40 mg via INTRAVENOUS
  Filled 2015-01-19 (×5): qty 40

## 2015-01-19 MED ORDER — FENTANYL CITRATE (PF) 250 MCG/5ML IJ SOLN
INTRAMUSCULAR | Status: AC
  Filled 2015-01-19: qty 5

## 2015-01-19 MED ORDER — ALBUMIN HUMAN 5 % IV SOLN
INTRAVENOUS | Status: DC | PRN
Start: 2015-01-19 — End: 2015-01-19
  Administered 2015-01-19 (×4): via INTRAVENOUS

## 2015-01-19 MED ORDER — SUCCINYLCHOLINE CHLORIDE 20 MG/ML IJ SOLN
INTRAMUSCULAR | Status: DC | PRN
  Administered 2015-01-19: 100 mg via INTRAVENOUS

## 2015-01-19 MED ORDER — PROPOFOL 10 MG/ML IV BOLUS
INTRAVENOUS | Status: AC
  Filled 2015-01-19: qty 20

## 2015-01-19 MED ORDER — PHENYLEPHRINE HCL 10 MG/ML IJ SOLN
INTRAMUSCULAR | Status: DC | PRN
  Administered 2015-01-19 (×3): 80 ug via INTRAVENOUS

## 2015-01-19 MED ORDER — MIDAZOLAM HCL 2 MG/2ML IJ SOLN: 1.0000 mg | INTRAMUSCULAR | Status: DC | PRN

## 2015-01-19 MED ORDER — ENOXAPARIN SODIUM 30 MG/0.3ML ~~LOC~~ SOLN
30.0000 mg | SUBCUTANEOUS | Status: DC
  Administered 2015-01-20: 30 mg via SUBCUTANEOUS
  Filled 2015-01-19 (×2): qty 0.3

## 2015-01-19 MED ORDER — 0.9 % SODIUM CHLORIDE (POUR BTL) OPTIME
TOPICAL | Status: DC | PRN
  Administered 2015-01-19 (×6): 1000 mL

## 2015-01-19 MED ORDER — FENTANYL BOLUS VIA INFUSION
25.0000 ug | INTRAVENOUS | Status: DC | PRN
Start: 2015-01-19 — End: 2015-01-20
  Administered 2015-01-20: 25 ug via INTRAVENOUS
  Filled 2015-01-19: qty 25

## 2015-01-19 MED ORDER — SODIUM CHLORIDE 0.9 % IV SOLN
25.0000 ug/h | INTRAVENOUS | Status: DC
  Administered 2015-01-19: 25 ug/h via INTRAVENOUS
  Filled 2015-01-19: qty 50

## 2015-01-19 MED ORDER — METOPROLOL TARTRATE 1 MG/ML IV SOLN
2.5000 mg | INTRAVENOUS | Status: DC | PRN
  Administered 2015-01-19: 5 mg via INTRAVENOUS
  Filled 2015-01-19 (×2): qty 5

## 2015-01-19 MED ORDER — PIPERACILLIN-TAZOBACTAM 3.375 G IVPB
3.3750 g | Freq: Three times a day (TID) | INTRAVENOUS | Status: DC
  Administered 2015-01-20 – 2015-01-22 (×8): 3.375 g via INTRAVENOUS
  Filled 2015-01-19 (×11): qty 50

## 2015-01-19 MED ORDER — ARTIFICIAL TEARS OP OINT
TOPICAL_OINTMENT | OPHTHALMIC | Status: AC
  Filled 2015-01-19: qty 10.5

## 2015-01-19 MED ORDER — ONDANSETRON HCL 4 MG/2ML IJ SOLN
INTRAMUSCULAR | Status: AC
  Filled 2015-01-19: qty 2

## 2015-01-19 MED ORDER — PROPOFOL 500 MG/50ML IV EMUL
INTRAVENOUS | Status: DC | PRN
  Administered 2015-01-19: 25 ug/kg/min via INTRAVENOUS

## 2015-01-19 MED ORDER — PHENYLEPHRINE HCL 10 MG/ML IJ SOLN
30.0000 ug/min | INTRAVENOUS | Status: DC
  Filled 2015-01-19: qty 1

## 2015-01-19 MED ORDER — IOHEXOL 300 MG/ML  SOLN: 25.0000 mL | INTRAMUSCULAR | Status: AC

## 2015-01-19 MED ORDER — DEXAMETHASONE SODIUM PHOSPHATE 4 MG/ML IJ SOLN
4.0000 mg | Freq: Four times a day (QID) | INTRAMUSCULAR | Status: DC
  Filled 2015-01-19 (×2): qty 1

## 2015-01-19 MED ORDER — PANTOPRAZOLE SODIUM 40 MG IV SOLR: 40.0000 mg | Freq: Two times a day (BID) | INTRAVENOUS | Status: DC

## 2015-01-19 MED ORDER — PHENYLEPHRINE HCL 10 MG/ML IJ SOLN
10.0000 mg | INTRAVENOUS | Status: DC | PRN
  Administered 2015-01-19: 100 ug/min via INTRAVENOUS

## 2015-01-19 MED ORDER — ARTIFICIAL TEARS OP OINT
TOPICAL_OINTMENT | OPHTHALMIC | Status: DC | PRN
  Administered 2015-01-19: 1 via OPHTHALMIC

## 2015-01-19 MED ORDER — STERILE WATER FOR INJECTION IJ SOLN
INTRAMUSCULAR | Status: AC
  Filled 2015-01-19: qty 10

## 2015-01-19 MED ORDER — PROPOFOL 1000 MG/100ML IV EMUL: 0.0000 ug/kg/min | INTRAVENOUS | Status: DC

## 2015-01-19 MED ORDER — ROCURONIUM BROMIDE 100 MG/10ML IV SOLN
INTRAVENOUS | Status: DC | PRN
  Administered 2015-01-19: 20 mg via INTRAVENOUS
  Administered 2015-01-19: 30 mg via INTRAVENOUS
  Administered 2015-01-19: 20 mg via INTRAVENOUS

## 2015-01-19 SURGICAL SUPPLY — 51 items
BLADE SURG ROTATE 9660 (MISCELLANEOUS) IMPLANT
CANISTER SUCTION 2500CC (MISCELLANEOUS) ×3 IMPLANT
CHLORAPREP W/TINT 26ML (MISCELLANEOUS) ×3 IMPLANT
COVER MAYO STAND STRL (DRAPES) IMPLANT
COVER SURGICAL LIGHT HANDLE (MISCELLANEOUS) ×3 IMPLANT
DRAPE LAPAROSCOPIC ABDOMINAL (DRAPES) ×3 IMPLANT
DRAPE PROXIMA HALF (DRAPES) IMPLANT
DRAPE UTILITY XL STRL (DRAPES) ×6 IMPLANT
DRAPE WARM FLUID 44X44 (DRAPE) ×3 IMPLANT
DRSG OPSITE POSTOP 4X10 (GAUZE/BANDAGES/DRESSINGS) IMPLANT
DRSG OPSITE POSTOP 4X8 (GAUZE/BANDAGES/DRESSINGS) IMPLANT
DRSG PAD ABDOMINAL 8X10 ST (GAUZE/BANDAGES/DRESSINGS) ×3 IMPLANT
ELECT BLADE 6.5 EXT (BLADE) IMPLANT
ELECT CAUTERY BLADE 6.4 (BLADE) ×3 IMPLANT
ELECT REM PT RETURN 9FT ADLT (ELECTROSURGICAL) ×3
ELECTRODE REM PT RTRN 9FT ADLT (ELECTROSURGICAL) ×1 IMPLANT
GAUZE SPONGE 4X4 12PLY STRL (GAUZE/BANDAGES/DRESSINGS) ×3 IMPLANT
GLOVE BIO SURGEON STRL SZ8 (GLOVE) ×3 IMPLANT
GLOVE BIOGEL PI IND STRL 8 (GLOVE) ×1 IMPLANT
GLOVE BIOGEL PI INDICATOR 8 (GLOVE) ×2
GOWN STRL REUS W/ TWL LRG LVL3 (GOWN DISPOSABLE) ×2 IMPLANT
GOWN STRL REUS W/ TWL XL LVL3 (GOWN DISPOSABLE) ×1 IMPLANT
GOWN STRL REUS W/TWL LRG LVL3 (GOWN DISPOSABLE) ×4
GOWN STRL REUS W/TWL XL LVL3 (GOWN DISPOSABLE) ×2
KIT BASIN OR (CUSTOM PROCEDURE TRAY) ×3 IMPLANT
KIT OSTOMY DRAINABLE 2.75 STR (WOUND CARE) ×3 IMPLANT
KIT ROOM TURNOVER OR (KITS) ×3 IMPLANT
LIGASURE IMPACT 36 18CM CVD LR (INSTRUMENTS) ×3 IMPLANT
NS IRRIG 1000ML POUR BTL (IV SOLUTION) ×18 IMPLANT
PACK GENERAL/GYN (CUSTOM PROCEDURE TRAY) ×3 IMPLANT
PAD ARMBOARD 7.5X6 YLW CONV (MISCELLANEOUS) ×3 IMPLANT
PENCIL BUTTON HOLSTER BLD 10FT (ELECTRODE) IMPLANT
RELOAD PROXIMATE 75MM BLUE (ENDOMECHANICALS) ×3 IMPLANT
SPECIMEN JAR LARGE (MISCELLANEOUS) IMPLANT
SPONGE LAP 18X18 X RAY DECT (DISPOSABLE) IMPLANT
STAPLER CUT CVD 40MM BLUE (STAPLE) ×3 IMPLANT
STAPLER PROXIMATE 75MM BLUE (STAPLE) ×3 IMPLANT
STAPLER VISISTAT 35W (STAPLE) ×3 IMPLANT
SUCTION POOLE TIP (SUCTIONS) ×3 IMPLANT
SUT PDS AB 1 TP1 96 (SUTURE) ×6 IMPLANT
SUT VIC AB 2-0 SH 18 (SUTURE) ×3 IMPLANT
SUT VIC AB 3-0 SH 18 (SUTURE) ×6 IMPLANT
SUT VICRYL AB 2 0 TIES (SUTURE) ×3 IMPLANT
SUT VICRYL AB 3 0 TIES (SUTURE) ×3 IMPLANT
TAPE CLOTH SURG 6X10 WHT LF (GAUZE/BANDAGES/DRESSINGS) ×3 IMPLANT
TOWEL OR 17X24 6PK STRL BLUE (TOWEL DISPOSABLE) ×3 IMPLANT
TOWEL OR 17X26 10 PK STRL BLUE (TOWEL DISPOSABLE) ×3 IMPLANT
TRAY FOLEY CATH 16FRSI W/METER (SET/KITS/TRAYS/PACK) IMPLANT
TUBE CONNECTING 12'X1/4 (SUCTIONS)
TUBE CONNECTING 12X1/4 (SUCTIONS) IMPLANT
YANKAUER SUCT BULB TIP NO VENT (SUCTIONS) ×3 IMPLANT

## 2015-01-19 NOTE — Progress Notes (Signed)
[facility] Progress Note   PIEPER KASIK  EUM:[id number]  DOB: Sep 20, 1950  DOA: 01/16/2015 PCP: Delman Cheadle, MD  Brief narrative: Kristin Griffin is a 64 y.o. female with hypertension, stage IV non-small cell lung cancer-adenoma who presents after falling in the parking lot. The patient was residing in a skilled nursing facility for rehabilitation after recent hospital stay. She began having abdominal cramping on the night of 9/25. She states it started after a Dulcolax suppository was given to her for constipation. She did not have any bowel movements. An abdominal x-ray the following morning revealed stool throughout the colon and another suppository was given which did result in a bowel movement.   Subjective: This morning she continues to have lower abdominal tenderness. No complaints of nausea or vomiting-she has no appetite.  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 and HCTZ use - urine sodium elevated but this may be falsely elevated due to HCTZ use -urine osmolality normal -Hold HCTZ -Continue IV fluids until better able to tolerate food    Protein-calorie malnutrition, severe -Add protein supplements  Leukocytosis -Likely from  Decadron and possibly a stress response as well -No signs or symptoms of infection -WBC count improved to normal today    Code Status:     Code Status Orders        Start     Ordered   01/16/15 1745  Full code   Continuous     01/16/15 1745     Disposition Plan: Return to skilled nursing facility when stable DVT prophylaxis: Lovenox Consultants: Orthopedic surgery  Antibiotics: Anti-infectives    Start     Dose/Rate Route Frequency Ordered Stop   01/18/15 2130  ceFAZolin (ANCEF) IVPB 2 g/50 mL premix     2 g 100 mL/hr over 30 Minutes Intravenous Every 6 hours 01/18/15 2054 01/19/15 1106      Objective: Filed Weights   01/16/15 1343  Weight: 52.617 kg (116 lb)    Intake/Output Summary (Last 24 hours) at 01/19/15 1316 Last data filed at 01/19/15 5400  Gross per 24 hour  Intake   2925 ml  Output    600 ml  Net   2325 ml     Vitals Filed Vitals:   01/18/15 2002 01/19/15 0018 01/19/15 0520 01/19/15 1036  BP: 97/58 100/65 124/70 124/70  Pulse: 104 115 129   Temp: 97.6 F (36.4 C) 97.9 F (36.6 C) 98.1 F (36.7 C)   TempSrc: Oral Oral Oral   Resp: '18 17 18   '$ Height:      Weight:      SpO2: 93% 96% 92%     Exam:  General:  Pt is alert, not in acute distress  HEENT: No icterus, No thrush, oral mucosa moist  Cardiovascular: regular rate and rhythm, S1/S2 No murmur  Respiratory: clear to auscultation bilaterally  Abdomen: Soft, +Bowel sounds,  tender in suprapubic area left lower quadrant and right lower quadrant, non distended, no guarding  MSK: No cyanosis or clubbing -dressing on right leg not opened  Data Reviewed: Basic Metabolic Panel:  Recent Labs Lab 01/16/15 1452 01/17/15 0447 01/18/15 0428 01/19/15 0840  NA 133* 134* 133* 134*  K 4.6 3.8 4.3 4.8  CL 98* 99* 99* 102  CO2 '25 27 27 24  '$ GLUCOSE 94 118* 125* 92  BUN 22* 19 12 25*  CREATININE 0.56 0.68 0.48 0.93  CALCIUM 8.8* 8.3* 8.6* 8.2*   Liver Function Tests:  Recent  Labs Lab 01/17/15 0447  AST 26  ALT 41  ALKPHOS 52  BILITOT 0.6  PROT 4.9*  ALBUMIN 2.5*   No results for input(s): LIPASE, AMYLASE in the last 168 hours. No results for input(s): AMMONIA in the last 168 hours. CBC:  Recent Labs Lab 01/16/15 1452 01/17/15 0447 01/18/15 0428 01/18/15 0817 01/19/15 0840  WBC 14.9* 21.9* 30.8* 13.5* 9.1  NEUTROABS 13.7*  --   --  12.9*  --   HGB 13.0 12.5 12.8 14.1 12.5  HCT 37.5 36.4 37.2 40.4 35.9*  MCV 93.1 93.6 92.5 91.8 92.1  PLT 437* 419* 418* 427* 283   Cardiac Enzymes: No results for input(s): CKTOTAL, CKMB, CKMBINDEX, TROPONINI in the last 168 hours. BNP (last 3 results) No results for input(s): BNP in the last 8760 hours.  ProBNP (last 3 results) No results for input(s): PROBNP in the last 8760 hours.  CBG:  Recent Labs Lab 01/18/15 1442 01/18/15 1845 01/18/15 2117 01/19/15 0649 01/19/15 1201  GLUCAP 120* 85 91 107* 109*    Recent Results (from the past 240 hour(s))  MRSA PCR Screening     Status: None   Collection Time: 01/17/15  7:23 AM  Result Value Ref Range Status   MRSA by PCR NEGATIVE NEGATIVE Final    Comment:        The GeneXpert MRSA Assay (FDA approved for NASAL specimens only), is one component of a comprehensive MRSA colonization surveillance program. It is not intended to diagnose MRSA infection nor to guide or monitor treatment for MRSA infections.   Surgical pcr screen     Status: Abnormal   Collection Time: 01/17/15  7:23 AM  Result Value Ref Range Status   MRSA, PCR NEGATIVE NEGATIVE Final   Staphylococcus aureus POSITIVE (A) NEGATIVE Final    Comment:        The Xpert SA Assay (FDA approved for NASAL specimens in patients over 41 years of age), is one component of a comprehensive surveillance program.  Test performance has been validated by [facility] for patients greater than or equal to 38 year old. It is not intended to diagnose infection nor to guide or monitor treatment.    Urine culture     Status: None   Collection Time: 01/18/15 10:48 AM  Result Value Ref Range Status   Specimen Description URINE, CATHETERIZED  Final   Special Requests NONE  Final   Culture NO GROWTH 1 DAY  Final   Report Status 01/19/2015 FINAL  Final     Studies: Dg Tibia/fibula Right  01/18/2015   CLINICAL DATA:  Tibial fracture, ORIF  EXAM: DG C-ARM 61-120 MIN; RIGHT TIBIA AND FIBULA - 2 VIEW  COMPARISON:  CT RIGHT knee 01/16/2015  FLUOROSCOPY TIME:  2 minutes 35 seconds  Number of images:  4  FINDINGS: Four digital C-arm fluoroscopic images obtained intraoperatively demonstrate placement of  a lateral plate and multiple screws across to a comminuted oblique fracture of the proximal RIGHT tibial metadiaphysis.  Knee joint alignment normal.  Minimally displaced RIGHT fibular neck fracture.  No additional fracture, dislocation or bone destruction.  Bones diffusely demineralized.  IMPRESSION: Post ORIF of the proximal RIGHT tibia.   Electronically Signed   By: Lavonia Dana M.D.   On: 01/18/2015 17:27   Dg Abd Portable 1v  01/18/2015   CLINICAL DATA:  Onset of mid abdominal pain last night  EXAM: PORTABLE ABDOMEN - 1 VIEW  COMPARISON:  01/05/2015  FINDINGS: The bowel gas pattern is normal. No radio-opaque calculi or other significant radiographic abnormality are seen.  IMPRESSION: Nonobstructive bowel gas pattern.   Electronically Signed   By: Kerby Moors M.D.   On: 01/18/2015 11:15   Dg C-arm 61-120 Min  01/18/2015   CLINICAL DATA:  Tibial fracture, ORIF  EXAM: DG C-ARM 61-120 MIN; RIGHT TIBIA AND FIBULA - 2 VIEW  COMPARISON:  CT RIGHT knee 01/16/2015  FLUOROSCOPY TIME:  2 minutes 35 seconds  Number of images:  4  FINDINGS: Four digital C-arm fluoroscopic images obtained intraoperatively demonstrate placement of a lateral plate and multiple screws across to a comminuted oblique fracture of the proximal RIGHT tibial metadiaphysis.  Knee joint alignment normal.  Minimally displaced RIGHT fibular  neck fracture.  No additional fracture, dislocation or bone destruction.  Bones diffusely demineralized.  IMPRESSION: Post ORIF of the proximal RIGHT tibia.   Electronically Signed   By: Lavonia Dana M.D.   On: 01/18/2015 17:27    Scheduled Meds:  Scheduled Meds: . bisacodyl  10 mg Rectal q1800  . calcium carbonate  1 tablet Oral Q breakfast  . dexamethasone  4 mg Oral 4 times per day  . docusate sodium  100 mg Oral BID  . enoxaparin (LOVENOX) injection  40 mg Subcutaneous Q24H  . feeding supplement (ENSURE ENLIVE)  237 mL Oral BID BM  . insulin aspart  0-5 Units Subcutaneous QHS  . insulin aspart  0-9 Units Subcutaneous TID WC  . lisinopril  20 mg Oral Daily  . multivitamin with minerals  1 tablet Oral Daily  . pantoprazole  40 mg Oral Daily  . verapamil  240 mg Oral QHS   Continuous Infusions: . sodium chloride 125 mL/hr at 01/18/15 0820  . sodium chloride 125 mL/hr at 01/19/15 0528  . lactated ringers 10 mL/hr at 01/18/15 1439    Time spent on care of this patient: 35 min   Lithia [place], MD 01/19/2015, 1:16 PM  LOS: 3 days   [facility] Office  [phone] Pager - Text Page per [email or web address] If 7PM-7AM, please contact night-coverage [email or web address]

## 2015-01-19 NOTE — Care Management Important Message (Signed)
Important Message  Patient Details  Name: Kristin Griffin MRN: [id number] Date of Birth: 1950-09-11   Medicare Important Message Given:  Yes-second notification given    Delorse Lek 01/19/2015, 2:34 PM

## 2015-01-19 NOTE — Progress Notes (Signed)
Orthopedic Tech Progress Note Patient Details:  Kristin Griffin Oct 30, 1950 [id number]  Patient ID: Kristin Griffin, female   DOB: 24-Dec-1950, 64 y.o.   MRN: [id number] Called in advanced brace order; spoke with Festus Aloe, [place] 01/19/2015, 9:10 AM

## 2015-01-19 NOTE — Consult Note (Signed)
Reason for Consult peritonitis Referring Physician: Wynelle Cleveland MD   Kristin Griffin is an 64 y.o. female.  HPI: Asked to see pt due to abdominal pain the last 2 days and CT today shows perforated viscous.  Pt has stage 4 lung cancer with brain mets ans severe PC malnutrition.  Pain is 10/10 severe and diffuse. She also has coffee ground emesis.  Her brother is at her bedside.   Past Medical History  Diagnosis Date  . Hypertension     Past Surgical History  Procedure Laterality Date  . Abdominal hysterectomy    . Video bronchoscopy with endobronchial navigation N/A 01/08/2015    Procedure: VIDEO BRONCHOSCOPY WITH ENDOBRONCHIAL NAVIGATION;  Surgeon: Collene Gobble, MD;  Location: [facility] OR;  Service: Thoracic;  Laterality: N/A;  . Orif tibia plateau Right 01/18/2015    Procedure: OPEN REDUCTION INTERNAL FIXATION (ORIF) TIBIAL PLATEAU;  Surgeon: Leandrew Koyanagi, MD;  Location: [place];  Service: Orthopedics;  Laterality: Right;    No family history on file.  Social History:  reports that she quit smoking about 14 months ago. She does not have any smokeless tobacco history on file. She reports that she drinks alcohol. She reports that she does not use illicit drugs.  Allergies: No Known Allergies  Medications: I have reviewed the patient's current medications.  Results for orders placed or performed during the hospital encounter of 01/16/15 (from the past 48 hour(s))  Sodium, urine, random     Status: None   Collection Time: 01/18/15  1:54 AM  Result Value Ref Range   Sodium, Ur 189 mmol/L  Osmolality, urine     Status: None   Collection Time: 01/18/15  1:54 AM  Result Value Ref Range   Osmolality, Ur 835 390 - 1090 mOsm/kg    Comment: Performed at [facility] metabolic panel     Status: Abnormal   Collection Time: 01/18/15  4:28 AM  Result Value Ref Range   Sodium 133 (L) 135 - 145 mmol/L   Potassium 4.3 3.5 - 5.1 mmol/L   Chloride 99 (L) 101 - 111 mmol/L   CO2 27 22 - 32  mmol/L   Glucose, Bld 125 (H) 65 - 99 mg/dL   BUN 12 6 - 20 mg/dL   Creatinine, Ser 0.48 0.44 - 1.00 mg/dL   Calcium 8.6 (L) 8.9 - 10.3 mg/dL   GFR calc non Af Amer >60 >60 mL/min   GFR calc Af Amer >60 >60 mL/min    Comment: (NOTE) The eGFR has been calculated using the CKD EPI equation. This calculation has not been validated in all clinical situations. eGFR's persistently <60 mL/min signify possible Chronic Kidney Disease.    Anion gap 7 5 - 15  CBC     Status: Abnormal   Collection Time: 01/18/15  4:28 AM  Result Value Ref Range   WBC 30.8 (H) 4.0 - 10.5 K/uL   RBC 4.02 3.87 - 5.11 MIL/uL   Hemoglobin 12.8 12.0 - 15.0 g/dL   HCT 37.2 36.0 - 46.0 %   MCV 92.5 78.0 - 100.0 fL   MCH 31.8 26.0 - 34.0 pg   MCHC 34.4 30.0 - 36.0 g/dL   RDW 13.8 11.5 - 15.5 %   Platelets 418 (H) 150 - 400 K/uL  Glucose, capillary     Status: Abnormal   Collection Time: 01/18/15  7:14 AM  Result Value Ref Range   Glucose-Capillary 142 (H) 65 - 99 mg/dL  CBC with  Differential/Platelet     Status: Abnormal   Collection Time: 01/18/15  8:17 AM  Result Value Ref Range   WBC 13.5 (H) 4.0 - 10.5 K/uL   RBC 4.40 3.87 - 5.11 MIL/uL   Hemoglobin 14.1 12.0 - 15.0 g/dL   HCT 40.4 36.0 - 46.0 %   MCV 91.8 78.0 - 100.0 fL   MCH 32.0 26.0 - 34.0 pg   MCHC 34.9 30.0 - 36.0 g/dL   RDW 13.6 11.5 - 15.5 %   Platelets 427 (H) 150 - 400 K/uL   Neutrophils Relative % 96 %   Neutro Abs 12.9 (H) 1.7 - 7.7 K/uL   Lymphocytes Relative 2 %   Lymphs Abs 0.3 (L) 0.7 - 4.0 K/uL   Monocytes Relative 2 %   Monocytes Absolute 0.3 0.1 - 1.0 K/uL   Eosinophils Relative 0 %   Eosinophils Absolute 0.0 0.0 - 0.7 K/uL   Basophils Relative 0 %   Basophils Absolute 0.0 0.0 - 0.1 K/uL  Urine culture     Status: None   Collection Time: 01/18/15 10:48 AM  Result Value Ref Range   Specimen Description URINE, CATHETERIZED    Special Requests NONE    Culture NO GROWTH 1 DAY    Report Status 01/19/2015 FINAL   Urinalysis,  Routine w reflex microscopic (not at [facility])     Status: Abnormal   Collection Time: 01/18/15 10:48 AM  Result Value Ref Range   Color, Urine ORANGE (A) YELLOW    Comment: BIOCHEMICALS MAY BE AFFECTED BY COLOR   APPearance HAZY (A) CLEAR   Specific Gravity, Urine 1.029 1.005 - 1.030   pH 6.0 5.0 - 8.0   Glucose, UA NEGATIVE NEGATIVE mg/dL   Hgb urine dipstick NEGATIVE NEGATIVE   Bilirubin Urine SMALL (A) NEGATIVE   Ketones, ur 15 (A) NEGATIVE mg/dL   Protein, ur 30 (A) NEGATIVE mg/dL   Urobilinogen, UA 1.0 0.0 - 1.0 mg/dL   Nitrite POSITIVE (A) NEGATIVE   Leukocytes, UA SMALL (A) NEGATIVE  Urine microscopic-add on     Status: Abnormal   Collection Time: 01/18/15 10:48 AM  Result Value Ref Range   Squamous Epithelial / LPF RARE RARE   WBC, UA 0-2 <3 WBC/hpf   RBC / HPF 0-2 <3 RBC/hpf   Bacteria, UA RARE RARE   Casts GRANULAR CAST (A) NEGATIVE    Comment: HYALINE CASTS   Urine-Other MUCOUS PRESENT   Glucose, capillary     Status: Abnormal   Collection Time: 01/18/15 11:33 AM  Result Value Ref Range   Glucose-Capillary 133 (H) 65 - 99 mg/dL   Comment 1 Repeat Test    Comment 2 Document in Chart   Glucose, capillary     Status: Abnormal   Collection Time: 01/18/15  2:14 PM  Result Value Ref Range   Glucose-Capillary 122 (H) 65 - 99 mg/dL   Comment 1 Repeat Test    Comment 2 Document in Chart   Glucose, capillary     Status: None   Collection Time: 01/18/15  6:45 PM  Result Value Ref Range   Glucose-Capillary 85 65 - 99 mg/dL   Comment 1 Notify RN    Comment 2 Document in Chart   Glucose, capillary     Status: None   Collection Time: 01/18/15  9:17 PM  Result Value Ref Range   Glucose-Capillary 91 65 - 99 mg/dL   Comment 1 Notify RN   Glucose, capillary     Status: Abnormal  Collection Time: 01/19/15  6:49 AM  Result Value Ref Range   Glucose-Capillary 107 (H) 65 - 99 mg/dL  Basic metabolic panel     Status: Abnormal   Collection Time: 01/19/15  8:40 AM  Result Value  Ref Range   Sodium 134 (L) 135 - 145 mmol/L   Potassium 4.8 3.5 - 5.1 mmol/L   Chloride 102 101 - 111 mmol/L   CO2 24 22 - 32 mmol/L   Glucose, Bld 92 65 - 99 mg/dL   BUN 25 (H) 6 - 20 mg/dL   Creatinine, Ser 0.93 0.44 - 1.00 mg/dL   Calcium 8.2 (L) 8.9 - 10.3 mg/dL   GFR calc non Af Amer >60 >60 mL/min   GFR calc Af Amer >60 >60 mL/min    Comment: (NOTE) The eGFR has been calculated using the CKD EPI equation. This calculation has not been validated in all clinical situations. eGFR's persistently <60 mL/min signify possible Chronic Kidney Disease.    Anion gap 8 5 - 15  CBC     Status: Abnormal   Collection Time: 01/19/15  8:40 AM  Result Value Ref Range   WBC 9.1 4.0 - 10.5 K/uL   RBC 3.90 3.87 - 5.11 MIL/uL   Hemoglobin 12.5 12.0 - 15.0 g/dL   HCT 35.9 (L) 36.0 - 46.0 %   MCV 92.1 78.0 - 100.0 fL   MCH 32.1 26.0 - 34.0 pg   MCHC 34.8 30.0 - 36.0 g/dL   RDW 14.1 11.5 - 15.5 %   Platelets 283 150 - 400 K/uL  Glucose, capillary     Status: Abnormal   Collection Time: 01/19/15 12:01 PM  Result Value Ref Range   Glucose-Capillary 109 (H) 65 - 99 mg/dL   Comment 1 Repeat Test    Comment 2 Document in Chart   Glucose, capillary     Status: Abnormal   Collection Time: 01/19/15  4:35 PM  Result Value Ref Range   Glucose-Capillary 109 (H) 65 - 99 mg/dL   Comment 1 Repeat Test    Comment 2 Document in Chart   Lactic acid, plasma     Status: Abnormal   Collection Time: 01/19/15  5:25 PM  Result Value Ref Range   Lactic Acid, Venous 3.4 (HH) 0.5 - 2.0 mmol/L    Comment: CRITICAL RESULT CALLED TO, READ BACK BY AND VERIFIED WITH: S HOWELL,RN 1800 01/19/15 D BRADLEY     Ct Abdomen Pelvis Wo Contrast  01/19/2015   CLINICAL DATA:  Abdominal pain for 3 weeks  EXAM: CT ABDOMEN AND PELVIS WITHOUT CONTRAST  TECHNIQUE: Multidetector CT imaging of the abdomen and pelvis was performed following the standard protocol without IV contrast.  COMPARISON:  01/04/2015  FINDINGS: Lower chest:  New small to moderate right pleural effusion. 3 cm area of infiltrate in the peripheral aspect of the inferior right middle lobe similar to prior study. Abnormal soft tissue fullness right hilum may represent mucous plugging or adenopathy. It is not evaluated well without contrast and is only partially visualized. There is trace left pleural effusion.  Hepatobiliary: Small gallstones again identified  Pancreas: Normal  Spleen: Normal  Adrenals/Urinary Tract: No acute findings. Known low-attenuation lesion left kidney as described on prior CT scan not seen on today's non contrasted study.  Stomach/Bowel: The colon is relatively decompressed. There is significant sigmoid colon diverticulosis. The majority of oral contrast is within the stomach, which is distended, as well as proximal small bowel, which is also distended. Mid to  distal small bowel appears decompressed.Caliber transition is in the left upper quadrant, in proximal jejunum, seen best on axial image 56, and the involved portion of bowel shows evidence of wall thickening without further detail. Very little oral contrast passes beyond this point.  Vascular/Lymphatic: Extensive atherosclerotic aortoiliac calcification  Reproductive: Not identified  Other: There is pneumoperitoneum. There is a small volume of ascites throughout the abdomen and pelvis. There is edematous change in the mesentery as well as in the subcutaneous soft tissues.  Musculoskeletal: Again identified are numerous small lucencies throughout the lumbar and sacral spine which could represent osteopenia or metastases or multiple myeloma as previously described.  IMPRESSION: 1. Pneumoperitoneum implying the presence of surgical abdomen, perforation is suspected. There is a high-grade proximal small bowel obstruction as described above. 2. Small volume of ascites. Edematous change in the subcutaneous soft tissues and mesentery. Right pleural effusion. 3. Irregular opacity right lung base again  identified. Abnormal right hilar soft tissue. 4. Lucent lesions in the spine as previously described. These could reflect osteoporosis versus myeloma or metastasis. Critical Value/emergent results were called by telephone at the time of interpretation on 01/19/2015 at 4:44 pm to Dr. Debbe Odea , who verbally acknowledged these results.   Electronically Signed   By: Skipper Cliche M.D.   On: 01/19/2015 16:45   Dg Tibia/fibula Right  01/18/2015   CLINICAL DATA:  Tibial fracture, ORIF  EXAM: DG C-ARM 61-120 MIN; RIGHT TIBIA AND FIBULA - 2 VIEW  COMPARISON:  CT RIGHT knee 01/16/2015  FLUOROSCOPY TIME:  2 minutes 35 seconds  Number of images:  4  FINDINGS: Four digital C-arm fluoroscopic images obtained intraoperatively demonstrate placement of a lateral plate and multiple screws across to a comminuted oblique fracture of the proximal RIGHT tibial metadiaphysis.  Knee joint alignment normal.  Minimally displaced RIGHT fibular neck fracture.  No additional fracture, dislocation or bone destruction.  Bones diffusely demineralized.  IMPRESSION: Post ORIF of the proximal RIGHT tibia.   Electronically Signed   By: Lavonia Dana M.D.   On: 01/18/2015 17:27   Dg Abd Portable 1v  01/18/2015   CLINICAL DATA:  Onset of mid abdominal pain last night  EXAM: PORTABLE ABDOMEN - 1 VIEW  COMPARISON:  01/05/2015  FINDINGS: The bowel gas pattern is normal. No radio-opaque calculi or other significant radiographic abnormality are seen.  IMPRESSION: Nonobstructive bowel gas pattern.   Electronically Signed   By: Kerby Moors M.D.   On: 01/18/2015 11:15   Dg C-arm 61-120 Min  01/18/2015   CLINICAL DATA:  Tibial fracture, ORIF  EXAM: DG C-ARM 61-120 MIN; RIGHT TIBIA AND FIBULA - 2 VIEW  COMPARISON:  CT RIGHT knee 01/16/2015  FLUOROSCOPY TIME:  2 minutes 35 seconds  Number of images:  4  FINDINGS: Four digital C-arm fluoroscopic images obtained intraoperatively demonstrate placement of a lateral plate and multiple screws across to a  comminuted oblique fracture of the proximal RIGHT tibial metadiaphysis.  Knee joint alignment normal.  Minimally displaced RIGHT fibular neck fracture.  No additional fracture, dislocation or bone destruction.  Bones diffusely demineralized.  IMPRESSION: Post ORIF of the proximal RIGHT tibia.   Electronically Signed   By: Lavonia Dana M.D.   On: 01/18/2015 17:27    Review of Systems  Constitutional: Positive for weight loss, malaise/fatigue and diaphoresis.  Eyes: Positive for blurred vision.  Respiratory: Positive for shortness of breath.   Cardiovascular: Positive for orthopnea.  Gastrointestinal: Positive for nausea, vomiting and abdominal pain.  Genitourinary:  Negative.   Musculoskeletal: Negative.   Neurological: Positive for weakness and headaches.  Endo/Heme/Allergies: Negative.   Psychiatric/Behavioral: Negative.    Blood pressure 114/65, pulse 127, temperature 98.6 F (37 C), temperature source Oral, resp. rate 16, height '5\' 5"'  (1.651 m), weight 52.617 kg (116 lb), SpO2 90 %. Physical Exam  Constitutional: She appears well-developed and well-nourished.  HENT:  Head: Normocephalic and atraumatic.  Eyes: Pupils are equal, round, and reactive to light.  Neck: Normal range of motion. Neck supple.  Cardiovascular: Tachycardia present.   Respiratory: She has wheezes.  GI: She exhibits distension. There is tenderness. There is rebound and guarding.  Neurological: She is alert.  Skin: Skin is warm.  Psychiatric: She is slowed. She exhibits a depressed mood.    Assessment/Plan: Perforated viscous probably duodenal ulcer Patient Active Problem List   Diagnosis Date Noted  . Hyponatremia 01/19/2015  . Tibia/fibula fracture 01/16/2015  . Lung cancer, lower lobe 01/11/2015  . Right lower lobe lung mass   . S/P bronchoscopy with biopsy   . Brain metastasis 01/07/2015  . Lung mass   . Lytic bone lesions on xray   . Lung nodule 01/04/2015  . Hypokalemia 01/04/2015  .  Protein-calorie malnutrition, severe 01/04/2015  . Right sided weakness 01/03/2015  . Brain mass 01/03/2015  . Osteoporosis 12/15/2013  . Hypertension   Long discussion with patient and brother given her circumstances With stage 4 lung cancer and brain mets outlook bleak Explained that she may not survive any intervention and not repairing ulcer would lead to probable death She is unlikely to survive more than 30 days with or without  Surgery She would like to proceed due to severe pain and hope this would alleviate that. The procedure has been discussed with the patient.  Alternative therapies have been discussed with the patient.  Operative risks include bleeding,  Infection,  Organ injury,  Nerve injury,  Blood vessel injury,  DVT,  Pulmonary embolism,  Death,  And possible reoperation.  Medical management risks include worsening of present situation.  The success of the procedure is 50 -90 % at treating patients symptoms.  The patient understands and agrees to proceed.  CORNETT,THOMAS A. 01/19/2015, 6:11 PM

## 2015-01-19 NOTE — Progress Notes (Signed)
   Subjective:  Patient is pleasantly confused today.  Objective:   VITALS:   Filed Vitals:   01/18/15 1932 01/18/15 2002 01/19/15 0018 01/19/15 0520  BP:  97/58 100/65 124/70  Pulse: 101 104 115 129  Temp:  97.6 F (36.4 C) 97.9 F (36.6 C) 98.1 F (36.7 C)  TempSrc:  Oral Oral Oral  Resp: '23 18 17 18  '$ Height:      Weight:      SpO2: 93% 93% 96% 92%    ABD soft Neurovascular intact Sensation intact distally Intact pulses distally Dorsiflexion/Plantar flexion intact Incision: dressing C/D/I and no drainage No cellulitis present Compartment soft   Lab Results  Component Value Date   WBC 13.5* 01/18/2015   HGB 14.1 01/18/2015   HCT 40.4 01/18/2015   MCV 91.8 01/18/2015   PLT 427* 01/18/2015     Assessment/Plan:  1 Day Post-Op   - Expected postop acute blood loss anemia - will monitor for symptoms - Up with PT/OT - DVT ppx - SCDs, ambulation, lovenox - NWB operative extremity - Pain control - bledsoe brace to be delivered this am  Marianna Payment 01/19/2015, 8:01 AM [phone]

## 2015-01-19 NOTE — Progress Notes (Signed)
As morning progress, pt seems less agitated and more appropriate in dialog. She is insisting on a laxative or enema for BM today.  Having some abd discomfort.

## 2015-01-19 NOTE — Progress Notes (Signed)
ANTIBIOTIC CONSULT NOTE - INITIAL  Pharmacy Consult for zosyn Indication: intra-abdominal infectino  No Known Allergies  Patient Measurements: Height: '5\' 5"'$  (165.1 cm) Weight: 116 lb (52.617 kg) IBW/kg (Calculated) : 57 Adjusted Body Weight:   Vital Signs: Temp: 98.6 F (37 C) (09/27 1334) Temp Source: Oral (09/27 0520) BP: 114/65 mmHg (09/27 1334) Pulse Rate: 127 (09/27 1334) Intake/Output from previous day: 09/26 0701 - 09/27 0700 In: 2925 [I.V.:2675; IV Piggyback:250] Out: 600 [Urine:550; Blood:50] Intake/Output from this shift:    Labs:  Recent Labs  01/17/15 0447 01/18/15 0428 01/18/15 0817 01/19/15 0840  WBC 21.9* 30.8* 13.5* 9.1  HGB 12.5 12.8 14.1 12.5  PLT 419* 418* 427* 283  CREATININE 0.68 0.48  --  0.93   Estimated Creatinine Clearance: 50.1 mL/min (by C-G formula based on Cr of 0.93). No results for input(s): VANCOTROUGH, VANCOPEAK, VANCORANDOM, GENTTROUGH, GENTPEAK, GENTRANDOM, TOBRATROUGH, TOBRAPEAK, TOBRARND, AMIKACINPEAK, AMIKACINTROU, AMIKACIN in the last 72 hours.   Microbiology: Recent Results (from the past 720 hour(s))  Urine culture     Status: None   Collection Time: 01/03/15  8:10 PM  Result Value Ref Range Status   Specimen Description URINE, CLEAN CATCH  Final   Special Requests NONE  Final   Culture MULTIPLE SPECIES PRESENT, SUGGEST RECOLLECTION  Final   Report Status 01/05/2015 FINAL  Final  MRSA PCR Screening     Status: None   Collection Time: 01/17/15  7:23 AM  Result Value Ref Range Status   MRSA by PCR NEGATIVE NEGATIVE Final    Comment:        The GeneXpert MRSA Assay (FDA approved for NASAL specimens only), is one component of a comprehensive MRSA colonization surveillance program. It is not intended to diagnose MRSA infection nor to guide or monitor treatment for MRSA infections.   Surgical pcr screen     Status: Abnormal   Collection Time: 01/17/15  7:23 AM  Result Value Ref Range Status   MRSA, PCR NEGATIVE  NEGATIVE Final   Staphylococcus aureus POSITIVE (A) NEGATIVE Final    Comment:        The Xpert SA Assay (FDA approved for NASAL specimens in patients over 62 years of age), is one component of a comprehensive surveillance program.  Test performance has been validated by [facility] for patients greater than or equal to 33 year old. It is not intended to diagnose infection nor to guide or monitor treatment.   Urine culture     Status: None   Collection Time: 01/18/15 10:48 AM  Result Value Ref Range Status   Specimen Description URINE, CATHETERIZED  Final   Special Requests NONE  Final   Culture NO GROWTH 1 DAY  Final   Report Status 01/19/2015 FINAL  Final    Medical History: Past Medical History  Diagnosis Date  . Hypertension     Medications:  Scheduled:  . calcium carbonate  1 tablet Oral Q breakfast  . dexamethasone  4 mg Oral 4 times per day  . docusate sodium  100 mg Oral BID  . [START ON 01/20/2015] enoxaparin (LOVENOX) injection  30 mg Subcutaneous Q24H  . feeding supplement (ENSURE ENLIVE)  237 mL Oral BID BM  . insulin aspart  0-5 Units Subcutaneous QHS  . insulin aspart  0-9 Units Subcutaneous TID WC  . multivitamin with minerals  1 tablet Oral Daily  . pantoprazole  40 mg Oral Daily   Infusions:  . sodium chloride 125 mL/hr at 01/18/15 0820  .  sodium chloride 125 mL/hr at 01/19/15 0528  . lactated ringers 10 mL/hr at 01/18/15 1439   Assessment: 64 yo female with intra-abdominal infection will be started on zosyn.  Goal of Therapy:  Resolution of infection  Plan:  - zosyn 3.375g iv q8h (4h infusion)  So, Tsz-Yin 01/19/2015,4:43 PM

## 2015-01-19 NOTE — Progress Notes (Signed)
At 0043, patient received Robaxin 500 mg PO for some abd discomfort in an attempt to relax patient.  Around 0200, patient started calling out and she seems agitated.  She forgot that she was in the hospital.  Have to keep reminding her of where she is and that she had surgery last night.  Patient is alert, call bell in reach, bed alarm is on.

## 2015-01-19 NOTE — Progress Notes (Signed)
Lactic acid 3,4 Dr Wynelle Cleveland aware

## 2015-01-19 NOTE — Consult Note (Signed)
PULMONARY / CRITICAL CARE MEDICINE   Name: Kristin Griffin MRN: [id number] DOB: 11-06-1950    ADMISSION DATE:  01/16/2015 CONSULTATION DATE:  01/19/2015  REFERRING MD :  Wynelle Cleveland ([facility])  CHIEF COMPLAINT:  Bowel perforation  INITIAL PRESENTATION: 64 year old female with recent diagnosis of Stage IV adenocarcinoma presented to ED from SNF after falling in a parking lot.  Admitted with R proximal tibia and proximal fibular fracture and underwent ORIF, course complicated by perforated viscous. She was taken emergently to OR and was transferred to ICU post-operatively.   STUDIES:  9/27 CT abd > Pneumoperitoneum, perforation suspected, high grade proximal small bowel obstruction. Small ascites. Lucent lesions in spine.   SIGNIFICANT EVENTS: 9/11 > 9/20 admitted with brain mass (r sided weakness), primary unclear, but lung suspected. Nav bronch for tissue > adenocarcinoma 9/24 > fall at [facility], admitted with R leg fx 9/26 > ORIF RLE 9/27 abdominal pain> perforated viscous > to OR emergently (ex lap with sigmoid colectomy with end colostomy), out to ICU on vent.   HISTORY OF PRESENT ILLNESS:  64 year old female with PMH as below, which includes a recent admission for R sided weakness, during which she was found to have brain mass. Further diagnostic testing revealed lung masses and lucency in spine on CT chest. Lung biopsy 9/16 showed adenocarcinoma. She was discharged to [facility] 9/20 with plans for outpatient oncology follow up, however, before she could do this she suffered a fall in a parking lot. She presented to ED for this 9/24 and was found to have R proximal tibia and proximal fibular fracture. 9/25 she had abdominal cramping, but KUB was negative. Laxatives were no successful in creating bowel movement or pain relief. Once soft tissue edema subsided she went to OR 9/26 for ORIF. This procedure was without complication. 9/27 lower abdominal pain/tenderness persisted. She had not had a bowel movement.  CT was ordered and showed pneumoperitoneum, concerning for perforation, and SBO.  She was taken emergently to the OR for repair. It is thought that she will have a poor prognosis due to stage IV cancer and severe malnutrition. She decided to proceed due to the amount of pain she was experiencing. Post-operatively she was taken to ICU for further management. PCCM to evaluate.   PAST MEDICAL HISTORY :   has a past medical history of Hypertension.  has past surgical history that includes Abdominal hysterectomy; Video bronchoscopy with endobronchial navigation (N/A, 01/08/2015); and ORIF tibia plateau (Right, 01/18/2015). Prior to Admission medications   Medication Sig Start Date End Date Taking? Authorizing Provider  alendronate (FOSAMAX) 70 MG tablet Take 1 tablet (70 mg total) by mouth every 7 (seven) days. Take with a full glass of water on an empty stomach. Patient taking differently: Take 70 mg by mouth every 7 (seven) days. Take with a full glass of water on an empty stomach on Thursdays 12/21/13  Yes Shawnee Knapp, MD  bisacodyl (DULCOLAX) 10 MG suppository Place 1 suppository (10 mg total) rectally daily. Patient taking differently: Place 10 mg rectally daily at 6 PM.  01/12/15  Yes Ripudeep K Rai, MD  calcium carbonate (OS-CAL) 600 MG TABS tablet Take 600 mg by mouth daily with breakfast.    Yes Historical Provider, MD  dexamethasone (DECADRON) 4 MG tablet Take 1 tablet (4 mg total) by mouth every 6 (six) hours. 01/12/15  Yes Ripudeep Krystal Eaton, MD  docusate sodium (COLACE) 100 MG capsule Take 1 capsule (100 mg total) by mouth 2 (two)  times daily. 01/12/15  Yes Ripudeep Krystal Eaton, MD  feeding supplement, ENSURE ENLIVE, (ENSURE ENLIVE) LIQD Take 237 mLs by mouth 2 (two) times daily between meals. 01/12/15  Yes Ripudeep Krystal Eaton, MD  insulin aspart (NOVOLOG) 100 UNIT/ML injection Inject 3 Units into the skin 3 (three) times daily with meals. Patient taking differently: Inject 3 Units into the skin See admin  instructions. For steroid induced hyperglycemia:  Inject 0-5 units subcutaneously BEFORE meals (CBG 0-150 0 units, >150 5 units) and inject 3 units subcutaneously 3 times daily WITH meals 01/12/15  Yes Ripudeep Krystal Eaton, MD  lisinopril-hydrochlorothiazide (PRINZIDE,ZESTORETIC) 20-25 MG per tablet take 1 tablet by mouth once daily 11/16/14  Yes Shawnee Knapp, MD  Multiple Vitamin (MULTIVITAMIN WITH MINERALS) TABS tablet Take 1 tablet by mouth daily.   Yes Historical Provider, MD  Omega-3 Fatty Acids (FISH OIL) 1000 MG CAPS Take 1,000 mg by mouth daily.    Yes Historical Provider, MD  ondansetron (ZOFRAN) 4 MG tablet Take 1 tablet (4 mg total) by mouth every 6 (six) hours as needed for nausea. 01/12/15  Yes Ripudeep Krystal Eaton, MD  oxyCODONE (OXY IR/ROXICODONE) 5 MG immediate release tablet Take 1 tablet (5 mg total) by mouth every 6 (six) hours as needed for moderate pain. 01/12/15  Yes Ripudeep Krystal Eaton, MD  pantoprazole (PROTONIX) 40 MG tablet Take 1 tablet (40 mg total) by mouth daily. 01/12/15  Yes Ripudeep Krystal Eaton, MD  polyethylene glycol (MIRALAX / GLYCOLAX) packet Take 17 g by mouth 2 (two) times daily. 01/12/15  Yes Ripudeep Krystal Eaton, MD  traMADol (ULTRAM) 50 MG tablet Take 50 mg by mouth every 8 (eight) hours as needed (pain). 01/12/15  Yes Historical Provider, MD  verapamil (CALAN-SR) 240 MG CR tablet TAKE 1 TABLET BY MOUTH AT BEDTIME Patient taking differently: Take 240 mg by mouth at bedtime.  11/18/14  Yes Chelle Jeffery, PA-C  insulin aspart (NOVOLOG) 100 UNIT/ML injection Inject 0-15 Units into the skin 3 (three) times daily with meals. Sliding scale  CBG 70 - 120: 0 units: CBG 121 - 150: 2 units; CBG 151 - 200: 3 units; CBG 201 - 250: 5 units; CBG 251 - 300: 8 units;CBG 301 - 350: 11 units; CBG 351 - 400: 15 units; CBG > 400 : 15 units and notify MD Patient not taking: Reported on 01/16/2015 01/12/15   Ripudeep Krystal Eaton, MD   No Known Allergies  FAMILY HISTORY:  indicated that her mother is deceased. She indicated  that her father is deceased.  SOCIAL HISTORY:  reports that she quit smoking about 14 months ago. She does not have any smokeless tobacco history on file. She reports that she drinks alcohol. She reports that she does not use illicit drugs.  REVIEW OF SYSTEMS:  Unable to obtain as pt is encephalopathic.  SUBJECTIVE:   VITAL SIGNS: Temp:  [97.6 F (36.4 C)-98.6 F (37 C)] 98.6 F (37 C) (09/27 1334) Pulse Rate:  [101-129] 127 (09/27 1334) Resp:  [16-25] 16 (09/27 1334) BP: (97-124)/(58-70) 114/65 mmHg (09/27 1334) SpO2:  [90 %-96 %] 90 % (09/27 1334) HEMODYNAMICS:   VENTILATOR SETTINGS:   INTAKE / OUTPUT:  Intake/Output Summary (Last 24 hours) at 01/19/15 1905 Last data filed at 01/19/15 0620  Gross per 24 hour  Intake   1575 ml  Output    450 ml  Net   1125 ml    PHYSICAL EXAMINATION: General: Appears older than stated age, resting in bed, in NAD. Neuro:  Sedated, does not follow commands. HEENT: [place]/AT. PERRL, sclerae anicteric. Cardiovascular: Tachy, regular, no M/R/G.  Lungs: Respirations even and unlabored.  Coarse bilaterally. Abdomen: Midline abdominal incision with dressings C/D/I, ostomy bag in place.  BS absent.  Abd soft, non-distended. Musculoskeletal: R leg dressings C/D/I, no edema.  Skin: Intact, warm, no rashes.    LABS:  CBC  Recent Labs Lab 01/18/15 0428 01/18/15 0817 01/19/15 0840  WBC 30.8* 13.5* 9.1  HGB 12.8 14.1 12.5  HCT 37.2 40.4 35.9*  PLT 418* 427* 283   Coag's No results for input(s): APTT, INR in the last 168 hours. BMET  Recent Labs Lab 01/17/15 0447 01/18/15 0428 01/19/15 0840  NA 134* 133* 134*  K 3.8 4.3 4.8  CL 99* 99* 102  CO2 _0 BUN 19 12 25*  CREATININE 0.68 0.48 0.93  GLUCOSE 118* 125* 92   Electrolytes  Recent Labs Lab 01/17/15 0447 01/18/15 0428 01/19/15 0840  CALCIUM 8.3* 8.6* 8.2*   Sepsis Markers  Recent Labs Lab 01/19/15 1725  LATICACIDVEN 3.4*   ABG No results for input(s):  PHART, PCO2ART, PO2ART in the last 168 hours. Liver Enzymes  Recent Labs Lab 01/17/15 0447  AST 26  ALT 41  ALKPHOS 52  BILITOT 0.6  ALBUMIN 2.5*   Cardiac Enzymes No results for input(s): TROPONINI, PROBNP in the last 168 hours. Glucose  Recent Labs Lab 01/18/15 1442 01/18/15 1845 01/18/15 2117 01/19/15 0649 01/19/15 1201 01/19/15 1635  GLUCAP 120* 85 91 107* 109* 109*    Imaging Ct Abdomen Pelvis Wo Contrast  01/19/2015   CLINICAL DATA:  Abdominal pain for 3 weeks  EXAM: CT ABDOMEN AND PELVIS WITHOUT CONTRAST  TECHNIQUE: Multidetector CT imaging of the abdomen and pelvis was performed following the standard protocol without IV contrast.  COMPARISON:  01/04/2015  FINDINGS: Lower chest: New small to moderate right pleural effusion. 3 cm area of infiltrate in the peripheral aspect of the inferior right middle lobe similar to prior study. Abnormal soft tissue fullness right hilum may represent mucous plugging or adenopathy. It is not evaluated well without contrast and is only partially visualized. There is trace left pleural effusion.  Hepatobiliary: Small gallstones again identified  Pancreas: Normal  Spleen: Normal  Adrenals/Urinary Tract: No acute findings. Known low-attenuation lesion left kidney as described on prior CT scan not seen on today's non contrasted study.  Stomach/Bowel: The colon is relatively decompressed. There is significant sigmoid colon diverticulosis. The majority of oral contrast is within the stomach, which is distended, as well as proximal small bowel, which is also distended. Mid to distal small bowel appears decompressed.Caliber transition is in the left upper quadrant, in proximal jejunum, seen best on axial image 56, and the involved portion of bowel shows evidence of wall thickening without further detail. Very little oral contrast passes beyond this point.  Vascular/Lymphatic: Extensive atherosclerotic aortoiliac calcification  Reproductive: Not identified   Other: There is pneumoperitoneum. There is a small volume of ascites throughout the abdomen and pelvis. There is edematous change in the mesentery as well as in the subcutaneous soft tissues.  Musculoskeletal: Again identified are numerous small lucencies throughout the lumbar and sacral spine which could represent osteopenia or metastases or multiple myeloma as previously described.  IMPRESSION: 1. Pneumoperitoneum implying the presence of surgical abdomen, perforation is suspected. There is a high-grade proximal small bowel obstruction as described above. 2. Small volume of ascites. Edematous change in the subcutaneous soft tissues and mesentery. Right pleural effusion. 3.  Irregular opacity right lung base again identified. Abnormal right hilar soft tissue. 4. Lucent lesions in the spine as previously described. These could reflect osteoporosis versus myeloma or metastasis. Critical Value/emergent results were called by telephone at the time of interpretation on 01/19/2015 at 4:44 pm to Dr. Debbe Odea , who verbally acknowledged these results.   Electronically Signed   By: Skipper Cliche M.D.   On: 01/19/2015 16:45     ASSESSMENT / PLAN:  PULMONARY OETT 9/27 >>> A: VDRF in post operative setting. 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 P:   Full vent support. ABG in 1 hour, adjust vent accordingly. VAP prevention measures. SBT in AM if able with goal extubation. CXR in AM. Scheduled to follow up with oncology as an outpatient in early October to discuss chemo.  CARDIOVASCULAR A:  Sinus tachycardia. Intermittent hypotension post op - likely sedation related. H/o HTN. P:  Lopressor PRN to maintain HR < 115. D/c propofol and switch to fentanyl. Neosynephrine PRN to maintain goal MAP > 65. Monitor hemodynamics. Repeat lactate.  RENAL A:   Mild hyponatremia. Pseudohypocalcemia - corrects to 9.5. P:   NS @ 100. Check ionized calcium BMP in AM.  GASTROINTESTINAL A:    Perforated Viscous s/p surgical repair 9/27. GI prophylaxis. Nutrition. P:   Surgery following, post op care per them. SUP: Pantoprazole. NPO.  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 VTE prophylaxis. P:  Scheduled to follow up with oncology as an outpatient in early October. Lovenox / SCD's. CBC in AM.  INFECTIOUS A:   Peritonitis - abundant feculent material noted throughout abdomen during surgery. P:   BCx2 9/27 > Abx: Zosyn, start date 9/27, day 1/x.  ENDOCRINE A:   No acute issues. P:   Follow glucose on BMP. Check TSH.  NEUROLOGIC A:   Acute metabolic encephalopathy due to sedation. 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 P:   Sedation: Fentanyl gtt / Midazolam PRN. RASS goal:  0 to -1. Daily WUA. Continue Decadron. Scheduled to follow up with oncology as an outpatient in early October to discuss chemo.  MUSCULOSKELETAL A: R proximal tibia fracture s/p ORIF 9/26. P: Ortho following, post op care per them.  FAMILY  - Updates:  None.  - Inter-disciplinary family meet or Palliative Care meeting due by:  10/3.  CC time:  35 minutes.   Montey Hora, [place] [facility] Pager: [phone]  or [phone] 01/19/2015, 11:11 PM

## 2015-01-19 NOTE — Evaluation (Signed)
Physical Therapy Evaluation Patient Details Name: Kristin Griffin MRN: [id number] DOB: 10-20-1950 Today's Date: 01/19/2015   History of Present Illness  64 y.o.-year-old female who sustained a right proximal tibial metaphyseal fracture from a mechanical fall s/p ORIF   Clinical Impression  Patient is s/p above surgery resulting in functional limitations due to the deficits listed below (see PT Problem List). Patient will benefit from skilled PT to increase their independence and safety with mobility to allow discharge to the venue listed below. Patient with difficulty following NWB status despite repeated cues. Max assistance for stand pivot transfer at this time.        Follow Up Recommendations SNF    Equipment Recommendations  Other (comment);None recommended by PT (to be addressed at next level of care)    Recommendations for Other Services       Precautions / Restrictions Precautions Precautions: Fall Required Braces or Orthoses: Other Brace/Splint Other Brace/Splint: Bledsoe locked Rt LE Restrictions Weight Bearing Restrictions: Yes RLE Weight Bearing: Non weight bearing      Mobility  Bed Mobility Overal bed mobility: Needs Assistance Bed Mobility: Supine to Sit     Supine to sit: Max assist     General bed mobility comments: needing assist at RLE and trunk as well as cues for sequence.   Transfers Overall transfer level: Needs assistance Equipment used: Rolling walker (2 wheeled) Transfers: Sit to/from [facility] Sit to Stand: Max assist Stand pivot transfers: Max assist       General transfer comment: stand pivot to Lt to sit in recliner. Repeated cues to use UEs to maintain NWB through RLE  Ambulation/Gait                Stairs            Wheelchair Mobility    Modified Rankin (Stroke Patients Only)       Balance Overall balance assessment: Needs assistance Sitting-balance support: No upper extremity  supported Sitting balance-Leahy Scale: Fair     Standing balance support: Bilateral upper extremity supported Standing balance-Leahy Scale: Poor Standing balance comment: using rw                             Pertinent Vitals/Pain Pain Assessment: 0-10 Pain Score: 1  Pain Location: abdomin Pain Descriptors / Indicators: Grimacing Pain Intervention(s): Monitored during session;Limited activity within patient's tolerance;Repositioned (denies knee pain while in bed)    Home Living Family/patient expects to be discharged to:: [place]: Kasandra Knudsen - single point;Walker - standard      Prior Function Level of Independence: Independent with assistive device(s)         Comments: using cane at time for ambulation     Hand Dominance        Extremity/Trunk Assessment               Lower Extremity Assessment: Generalized weakness RLE Deficits / Details: max assist with moving RLE       Communication   Communication: No difficulties  Cognition Arousal/Alertness: Awake/alert Behavior During Therapy: WFL for tasks assessed/performed Overall Cognitive Status: Within Functional Limits for tasks assessed                 General Comments: repeated cues for NWB needed during mobility    General Comments      Exercises  Assessment/Plan    PT Assessment Patient needs continued PT services  PT Diagnosis Difficulty walking;Generalized weakness   PT Problem List Decreased strength;Decreased activity tolerance;Decreased balance;Decreased mobility;Decreased knowledge of use of DME;Decreased safety awareness;Pain  PT Treatment Interventions DME instruction;Gait training;Functional mobility training;Therapeutic activities;Therapeutic exercise;Balance training;Patient/family education   PT Goals (Current goals can be found in the Care Plan section) Acute Rehab PT Goals Patient Stated Goal: move better PT  Goal Formulation: With patient/family Time For Goal Achievement: 02/02/15 Potential to Achieve Goals: Good    Frequency Min 3X/week   Barriers to discharge        Co-evaluation               End of Session Equipment Utilized During Treatment: Gait belt Activity Tolerance: Patient limited by pain;Other (comment) (complaints of abdominal pain, occasional Rt knee pain. ) Patient left: in chair;with call bell/phone within reach;with chair alarm set Nurse Communication: Mobility status;Precautions;Weight bearing status         Time: 1343-1411 PT Time Calculation (min) (ACUTE ONLY): 28 min   Charges:   PT Evaluation $Initial PT Evaluation Tier I: 1 Procedure PT Treatments $Therapeutic Activity: 8-22 mins   PT G Codes:        Cassell Clement, PT, CSCS Pager [phone] Office [phone]  01/19/2015, 2:24 PM

## 2015-01-19 NOTE — Progress Notes (Signed)
Order has been placed for the locked Bledsoe Brace.  Patient will get it later this morning.  Patient does not appear strong enough to use the over head trapeze.  She may do well with turning and assistance from staff.

## 2015-01-19 NOTE — Transfer of Care (Signed)
Immediate Anesthesia Transfer of Care Note  Patient: Kristin Griffin  Procedure(s) Performed: Procedure(s): EXPLORATORY LAPAROTOMY; SIGMOID COLECTOMY; END COLOSTOMY (N/A)  Patient Location: SICU  Anesthesia Type:General  Level of Consciousness: sedated, unresponsive and Patient remains intubated per anesthesia plan  Airway & Oxygen Therapy: Patient remains intubated per anesthesia plan and Patient placed on Ventilator (see vital sign flow sheet for setting)  Post-op Assessment: Report given to RN and Post -op Vital signs reviewed and stable  Post vital signs: Reviewed and stable  Last Vitals:  Filed Vitals:   01/19/15 1949  BP: 107/66  Pulse: 113  Temp: 36.5 C  Resp: 16    Complications: No apparent anesthesia complications

## 2015-01-19 NOTE — Progress Notes (Signed)
Lab came around 0530 this am, patient refused lab work.  Lab will put in the order again for 0900 this morning.

## 2015-01-19 NOTE — Anesthesia Preprocedure Evaluation (Addendum)
Anesthesia Evaluation  Patient identified by MRN, date of birth, ID band Patient awake    Reviewed: Allergy & Precautions, NPO status , Patient's Chart, lab work & pertinent test results, reviewed documented beta blocker date and time   History of Anesthesia Complications Negative for: history of anesthetic complications  Airway Mallampati: II  TM Distance: >3 FB Neck ROM: Full    Dental  (+) Edentulous Upper, Poor Dentition, Dental Advisory Given   Pulmonary former smoker,  Lung cancer with mets   Pulmonary exam normal        Cardiovascular Exercise Tolerance: Poor hypertension, Pt. on home beta blockers (-) angina(-) Past MI  Rhythm:Regular Rate:Tachycardia     Neuro/Psych Brain metastasis negative psych ROS   GI/Hepatic Neg liver ROS, Perforated bowel; coffee ground emesis   Endo/Other  negative endocrine ROS  Renal/GU negative Renal ROS     Musculoskeletal cachexia   Abdominal (+)  Abdomen: tender.    Peds  Hematology negative hematology ROS (+)   Anesthesia Other Findings   Reproductive/Obstetrics                           Anesthesia Physical  Anesthesia Plan  ASA: III and emergent  Anesthesia Plan: General   Post-op Pain Management:    Induction: Intravenous and Rapid sequence  Airway Management Planned: Oral ETT  Additional Equipment:   Intra-op Plan:   Post-operative Plan: Possible Post-op intubation/ventilation  Informed Consent: I have reviewed the patients History and Physical, chart, labs and discussed the procedure including the risks, benefits and alternatives for the proposed anesthesia with the patient or authorized representative who has indicated his/her understanding and acceptance.     Plan Discussed with: CRNA and Surgeon  Anesthesia Plan Comments:        Anesthesia Quick Evaluation

## 2015-01-19 NOTE — Anesthesia Postprocedure Evaluation (Signed)
  Anesthesia Post-op Note  Patient: Kristin Griffin  Procedure(s) Performed: Procedure(s): EXPLORATORY LAPAROTOMY; SIGMOID COLECTOMY; END COLOSTOMY (N/A)  Patient Location: ICU  Anesthesia Type:General  Level of Consciousness: sedated and Patient remains intubated per anesthesia plan  Airway and Oxygen Therapy: Patient remains intubated per anesthesia plan and Patient placed on Ventilator (see vital sign flow sheet for setting)  Post-op Pain: mild  Post-op Assessment: Post-op Vital signs reviewed, Patient's Cardiovascular Status Stable and Respiratory Function Stable LLE Motor Response: Purposeful movement, Responds to commands LLE Sensation: Full sensation RLE Motor Response: Purposeful movement, Responds to commands RLE Sensation: No numbness      Post-op Vital Signs: Reviewed and stable  Last Vitals:  Filed Vitals:   01/19/15 2300  BP: 106/68  Pulse: 151  Temp: 37.3 C  Resp: 16    Complications: No apparent anesthesia complications

## 2015-01-19 NOTE — Op Note (Signed)
Preoperative diagnosis: Perforated viscus with peritonitis  Postoperative diagnosis: Perforated sigmoid colon with Hinchey 4 gross contamination  Procedure: Exploratory laparotomy with sigmoid colectomy with end colostomy  Surgeon: Erroll Luna M.D.  Anesthesia: Gen. 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 She's been on steroids in the last couple weeks due to metastasis to her brain. She was admitted due to a fall and recently had a lower extremety fracture repaired. She did have  abdominal pain  For the last 2 days a CT scan showed a perforated viscus. We had a length discussion about her condition and given her terminal diagnosis of stage IV lung cancer the option of foregoing surgery and providing comfort care was offered to her. She isn't much pain for this she felt he wished to proceed with exploratory laparotomy. I explained to her that her recovery her cancer will grow and most likely be untreatable. She understands that she is a high likelihood of dying from surgery bright due to her severe discomfort she wished to proceed. Her brother was with her at bedside and voices understanding as well.The procedure has been discussed with the patient.  Alternative therapies have been discussed with the patient.  Operative risks include bleeding,  Infection, ostomy   Organ injury,  Nerve injury,  Blood vessel injury,  DVT,  Pulmonary embolism,  Death,  And possible reoperation.  Medical management risks include worsening of present situation.  The success of the procedure is 50 -90 % at treating patients symptoms.  The patient understands and agrees to proceed.  Description of procedure: Patient was seen in the holding area. All questions are answered. She's taken back to the operating room placed supine on the OR table. After induction of general  endotracheal esthesia, a Foley catheter was placed under sterile conditions. Her abdomen was prepped and draped in a sterile fashion. She was on preoperative Zosyn. Timeout was done. Midline incision was made from the xiphoid to just below the umbilicus. Dissection was carried to the subcutaneous fat until the linea alba was identified and opened the midline. Upon opening the peritoneum there is a significant amount of free air and significant intra-abdominal contamination. A retractor was placed. The stomach was examined found to be normal. The duodenum was found to be normal. Small bowel was run had a significant amount of fibrinous exudate on it but no perforation. The appendix identified was inflamed from the external peritonitis not for appendicitis. There is significant fibrinous exudate involving the cecum and ascending colon. Transverse colon was normal as well as the splenic flexure and descending colon. Sigmoid colon was grossly inflamed perforated consistent with diverticulitis. She has significant gross fecal contamination. Entire abdominal cavity. Using a GIA-75 stapler device the distal descending colon was divided. LigaSure was used taken the mesentery down to resect the perforated sigmoid colon segment in a contour were to a 75 stapling devices use the distal portion of the colon to divided. 5 L of irrigation were used to irrigate out the abdominal cavity. In the left lower quadrant circular incision was made for creation of colostomy through the abdominal wall musculature. The colon was brought through this easily. NG tube was positioned in the stomach. Liver was normal no signs of metastatic disease. Fascia closed with #1 double-stranded PDS. Skin was packed open. Colostomy matured with 3-0 Vicryl. Patient was left intubated taken to  the intensive care unit in critical but stable condition. All final counts are found to be correct.

## 2015-01-19 NOTE — Anesthesia Postprocedure Evaluation (Signed)
  Anesthesia Post-op Note  Patient: Kristin Griffin  Procedure(s) Performed: Procedure(s): OPEN REDUCTION INTERNAL FIXATION (ORIF) TIBIAL PLATEAU (Right)  Patient Location: PACU  Anesthesia Type:General  Level of Consciousness: awake, alert , oriented and patient cooperative  Airway and Oxygen Therapy: Patient Spontanous Breathing and Patient connected to nasal cannula oxygen  Post-op Pain: mild  Post-op Assessment: Post-op Vital signs reviewed, Patient's Cardiovascular Status Stable, Respiratory Function Stable, Patent Airway, No signs of Nausea or vomiting and Pain level controlled LLE Motor Response: Purposeful movement, Responds to commands LLE Sensation: Full sensation RLE Motor Response: Purposeful movement, Responds to commands RLE Sensation: No numbness      Post-op Vital Signs: Reviewed and stable  Last Vitals:  Filed Vitals:   01/19/15 0520  BP: 124/70  Pulse: 129  Temp: 36.7 C  Resp: 18    Complications: No apparent anesthesia complications

## 2015-01-20 ENCOUNTER — Other Ambulatory Visit: Payer: No Typology Code available for payment source

## 2015-01-20 ENCOUNTER — Inpatient Hospital Stay
Admit: 2015-01-20 | Discharge: 2015-01-20 | Disposition: A | Discharge status: Home / Self Care | Payer: No Typology Code available for payment source | Attending: Radiation Oncology | Admitting: Radiation Oncology

## 2015-01-20 ENCOUNTER — Encounter (HOSPITAL_COMMUNITY): Payer: Self-pay | Admitting: Surgery

## 2015-01-20 ENCOUNTER — Inpatient Hospital Stay (HOSPITAL_COMMUNITY): Payer: Medicare Other

## 2015-01-20 ENCOUNTER — Encounter: Payer: Self-pay | Admitting: Radiation Therapy

## 2015-01-20 DIAGNOSIS — R6521 Severe sepsis with septic shock: Secondary | ICD-10-CM

## 2015-01-20 DIAGNOSIS — J95821 Acute postprocedural respiratory failure: Secondary | ICD-10-CM

## 2015-01-20 DIAGNOSIS — A419 Sepsis, unspecified organism: Secondary | ICD-10-CM

## 2015-01-20 DIAGNOSIS — L8990 Pressure ulcer of unspecified site, unspecified stage: Secondary | ICD-10-CM | POA: Insufficient documentation

## 2015-01-20 DIAGNOSIS — J9601 Acute respiratory failure with hypoxia: Secondary | ICD-10-CM

## 2015-01-20 DIAGNOSIS — E43 Unspecified severe protein-calorie malnutrition: Secondary | ICD-10-CM

## 2015-01-20 LAB — BASIC METABOLIC PANEL
ANION GAP: 8 (ref 5–15)
ANION GAP: 8 (ref 5–15)
BUN: 30 mg/dL — ABNORMAL HIGH (ref 6–20)
BUN: 32 mg/dL — AB (ref 6–20)
CALCIUM: 7.5 mg/dL — AB (ref 8.9–10.3)
CHLORIDE: 103 mmol/L (ref 101–111)
CO2: 23 mmol/L (ref 22–32)
CO2: 21 mmol/L — ABNORMAL LOW (ref 22–32)
Calcium: 7.6 mg/dL — ABNORMAL LOW (ref 8.9–10.3)
Chloride: 101 mmol/L (ref 101–111)
Creatinine, Ser: 1.27 mg/dL — ABNORMAL HIGH (ref 0.44–1.00)
Creatinine, Ser: 1.21 mg/dL — ABNORMAL HIGH (ref 0.44–1.00)
GFR calc non Af Amer: 43 mL/min — ABNORMAL LOW (ref >60)
GFR, EST AFRICAN AMERICAN: 50 mL/min — AB (ref >60)
GFR, EST AFRICAN AMERICAN: 53 mL/min — AB (ref >60)
GFR, EST NON AFRICAN AMERICAN: 46 mL/min — AB (ref >60)
GLUCOSE: 98 mg/dL (ref 65–99)
Glucose, Bld: 95 mg/dL (ref 65–99)
POTASSIUM: 4 mmol/L (ref 3.5–5.1)
POTASSIUM: 4.3 mmol/L (ref 3.5–5.1)
SODIUM: 132 mmol/L — AB (ref 135–145)
Sodium: 132 mmol/L — ABNORMAL LOW (ref 135–145)

## 2015-01-20 LAB — CBC
HCT: 27 % — ABNORMAL LOW (ref 36.0–46.0)
HEMOGLOBIN: 9.4 g/dL — AB (ref 12.0–15.0)
MCH: 31.5 pg (ref 26.0–34.0)
MCHC: 34.8 g/dL (ref 30.0–36.0)
MCV: 90.6 fL (ref 78.0–100.0)
PLATELETS: 189 10*3/uL (ref 150–400)
RBC: 2.98 MIL/uL — AB (ref 3.87–5.11)
RDW: 13.9 % (ref 11.5–15.5)
WBC: 4.3 10*3/uL (ref 4.0–10.5)

## 2015-01-20 LAB — GLUCOSE, CAPILLARY
GLUCOSE-CAPILLARY: 87 mg/dL (ref 65–99)
GLUCOSE-CAPILLARY: 74 mg/dL (ref 65–99)
GLUCOSE-CAPILLARY: 62 mg/dL — AB (ref 65–99)
GLUCOSE-CAPILLARY: 84 mg/dL (ref 65–99)
GLUCOSE-CAPILLARY: 151 mg/dL — AB (ref 65–99)
Glucose-Capillary: 122 mg/dL — ABNORMAL HIGH (ref 65–99)

## 2015-01-20 LAB — MAGNESIUM: Magnesium: 1.6 mg/dL — ABNORMAL LOW (ref 1.7–2.4)

## 2015-01-20 LAB — LACTIC ACID, PLASMA: Lactic Acid, Venous: 2 mmol/L (ref 0.5–2.0)

## 2015-01-20 LAB — TRIGLYCERIDES: Triglycerides: 95 mg/dL (ref <150)

## 2015-01-20 LAB — PHOSPHORUS: Phosphorus: 4.8 mg/dL — ABNORMAL HIGH (ref 2.5–4.6)

## 2015-01-20 LAB — TSH: TSH: 0.227 u[IU]/mL — ABNORMAL LOW (ref 0.350–4.500)

## 2015-01-20 MED ORDER — CHLORHEXIDINE GLUCONATE CLOTH 2 % EX PADS
6.0000 | MEDICATED_PAD | Freq: Every day | CUTANEOUS | Status: AC
  Administered 2015-01-20 – 2015-01-24 (×5): 6 via TOPICAL

## 2015-01-20 MED ORDER — ENOXAPARIN SODIUM 40 MG/0.4ML ~~LOC~~ SOLN
40.0000 mg | SUBCUTANEOUS | Status: DC
  Administered 2015-01-21 – 2015-01-23 (×3): 40 mg via SUBCUTANEOUS
  Filled 2015-01-20 (×4): qty 0.4

## 2015-01-20 MED ORDER — CHLORHEXIDINE GLUCONATE 0.12% ORAL RINSE (MEDLINE KIT)
15.0000 mL | Freq: Two times a day (BID) | OROMUCOSAL | Status: DC
Start: 2015-01-20 — End: 2015-01-22
  Administered 2015-01-20 – 2015-01-21 (×3): 15 mL via OROMUCOSAL

## 2015-01-20 MED ORDER — SODIUM CHLORIDE 0.9 % IV BOLUS (SEPSIS)
500.0000 mL | Freq: Once | INTRAVENOUS | Status: AC
  Administered 2015-01-20: 500 mL via INTRAVENOUS

## 2015-01-20 MED ORDER — ANTISEPTIC ORAL RINSE SOLUTION (CORINZ)
7.0000 mL | Freq: Four times a day (QID) | OROMUCOSAL | Status: DC
  Administered 2015-01-20 – 2015-01-24 (×15): 7 mL via OROMUCOSAL

## 2015-01-20 MED ORDER — FENTANYL CITRATE (PF) 100 MCG/2ML IJ SOLN
25.0000 ug | INTRAMUSCULAR | Status: DC | PRN
  Administered 2015-01-20 – 2015-01-21 (×5): 25 ug via INTRAVENOUS
  Administered 2015-01-22 (×3): 50 ug via INTRAVENOUS
  Administered 2015-01-22: 25 ug via INTRAVENOUS
  Administered 2015-01-23 – 2015-01-24 (×7): 50 ug via INTRAVENOUS
  Filled 2015-01-20 (×17): qty 2

## 2015-01-20 MED ORDER — MUPIROCIN 2 % EX OINT
1.0000 "application " | TOPICAL_OINTMENT | Freq: Two times a day (BID) | CUTANEOUS | Status: DC
  Administered 2015-01-20 – 2015-01-24 (×9): 1 via NASAL
  Filled 2015-01-20 (×2): qty 22

## 2015-01-20 MED ORDER — DEXTROSE-NACL 5-0.9 % IV SOLN
INTRAVENOUS | Status: DC
  Administered 2015-01-20 (×2): via INTRAVENOUS
  Administered 2015-01-21: 1000 mL via INTRAVENOUS
  Administered 2015-01-21 – 2015-01-23 (×4): via INTRAVENOUS

## 2015-01-20 MED ORDER — SODIUM CHLORIDE 0.9 % IV SOLN
500.0000 mL | Freq: Once | INTRAVENOUS | Status: AC
Start: 2015-01-20 — End: 2015-01-20
  Administered 2015-01-20: 500 mL via INTRAVENOUS

## 2015-01-20 MED ORDER — MAGNESIUM SULFATE 2 GM/50ML IV SOLN
2.0000 g | Freq: Once | INTRAVENOUS | Status: AC
  Administered 2015-01-20: 2 g via INTRAVENOUS
  Filled 2015-01-20: qty 50

## 2015-01-20 MED ORDER — FLEET ENEMA 7-19 GM/118ML RE ENEM
1.0000 | ENEMA | Freq: Once | RECTAL | Status: DC
  Filled 2015-01-20: qty 1

## 2015-01-20 NOTE — Progress Notes (Signed)
During change of shift, @ 2000, called received  & requested that report be called to [phone]. It was. Patient and famliy @ bedside no further distress noted.)Patient taken to Sx.

## 2015-01-20 NOTE — Progress Notes (Signed)
OT Cancellation Note  Patient Details Name: Kristin Griffin MRN: [id number] DOB: 01/18/1951   Cancelled Treatment:    Reason Eval/Treat Not Completed: Patient not medically ready - Pt extubated at 9:55.  Will try back this pm as schedule allows   Darlina Rumpf [place], OTR/L [phone]  01/20/2015, 10:56 AM

## 2015-01-20 NOTE — Progress Notes (Signed)
Patient noted to have a change in breathing and more lethargic; CCM notified and Dr. Lamonte Sakai bedside to evaluate patient.  He spoke with patients son along with Palliative care MD and decision made to make patient DNR.

## 2015-01-20 NOTE — Progress Notes (Signed)
Called to bedside by RN for increasing lethargy, increased WOB.  Son at bedside.  One dose PRN fentanyl given earlier today for abd pain. Pt arouses to loud voice but quickly falls back to sleep.  Lungs essentially clear, good air movement.  Per son oncology has not offered any treatment for her cancer given her multiple co-morbidities and overall decline.  Discussed at length with son and Dr. Hilma Favors who arrived for palliative care meeting.  Suspect pt is simply unable to maintain her respiratory status r/t overall decline, but may also be some degree of residual sedation from pain medications while on vent.   PLAN -  DNR Cont supportive care for now  NO ABG  Transition to comfort measures only if declines further and liberalize analgesia   Nickolas Madrid, NP 01/20/2015  2:24 PM Pager: [phone] or [phone]   Attending Note:  I have examined patient, reviewed labs, studies and notes. I have discussed the case with Shon Millet, and I agree with the data and plans as amended above. I have discussed Ms Braziel's status with her son at bedside. She has become obtunded, is clearly declining although is hemodynamically stable, adequately oxygenating. At this point we have agreed that she should be DNR, we will follow her MS, resp status. I suspect she will continue to decline and we will transition to comfort care this pm. Her ostomy is dusky - a clear change from this am.  Appreciate Dr Delanna Ahmadi assistance with palliative care. Independent critical care time is 45 minutes.   Baltazar Apo, MD, PhD 01/20/2015, 2:33 PM [facility] [phone] or if no answer [phone]

## 2015-01-20 NOTE — Progress Notes (Signed)
[facility] Progress Note 1 Day Post-Op  Subjective: Patient is stable on the ventilator.  Not having much urine output.  Very comfortable.  On Fentanyl only.  Objective: Vital signs in last 24 hours: Temp:  [97.7 F (36.5 C)-99.2 F (37.3 C)] 97.7 F (36.5 C) (09/28 0803) Pulse Rate:  [89-159] 104 (09/28 0752) Resp:  [13-26] 16 (09/28 0752) BP: (79-136)/(52-77) 96/63 mmHg (09/28 0752) SpO2:  [90 %-100 %] 100 % (09/28 0752) FiO2 (%):  [40 %-50 %] 40 % (09/28 0752) Weight:  [60.9 kg (134 lb 4.2 oz)] 60.9 kg (134 lb 4.2 oz) (09/28 0546) Last BM Date: 01/18/15  Intake/Output from previous day: 09/27 0701 - 09/28 0700 In: 7170.4 [I.V.:5670.4; NG/GT:150; IV Piggyback:1350] Out: 2418 [Urine:168; Emesis/NG output:250; Blood:200] Intake/Output this shift:    General: Only on small amount of fentanyl.  No distress  Lungs: Clear to auscultation.  Abd: Soft, midline wound is open for wet to dry dressings.  Stoma is pink and viable and has some stool output at the margin.  Extremities: No changes  Neuro: Will awaken.  Seems intact.  Lab Results:  _0 (wbc:2,hgb:2,hct:2,plt:2) BMET ) Recent Labs  01/19/15 2325 01/20/15 0235  NA 132* 132*  K 4.0 4.3  CL 101 103  CO2 23 21*  GLUCOSE 98 95  BUN 30* 32*  CREATININE 1.27* 1.21*  CALCIUM 7.5* 7.6*   PT/INR No results for input(s): LABPROT, INR in the last 72 hours. ABG  Recent Labs  01/19/15 2334  PHART 7.322*  HCO3 20.6    Studies/Results: Ct Abdomen Pelvis Wo Contrast  01/19/2015   CLINICAL DATA:  Abdominal pain for 3 weeks  EXAM: CT ABDOMEN AND PELVIS WITHOUT CONTRAST  TECHNIQUE: Multidetector CT imaging of the abdomen and pelvis was performed following the standard protocol without IV contrast.  COMPARISON:  01/04/2015  FINDINGS: Lower chest: New small to moderate right pleural effusion. 3 cm area of infiltrate in the peripheral aspect of the inferior right middle lobe similar to prior study. Abnormal soft tissue  fullness right hilum may represent mucous plugging or adenopathy. It is not evaluated well without contrast and is only partially visualized. There is trace left pleural effusion.  Hepatobiliary: Small gallstones again identified  Pancreas: Normal  Spleen: Normal  Adrenals/Urinary Tract: No acute findings. Known low-attenuation lesion left kidney as described on prior CT scan not seen on today's non contrasted study.  Stomach/Bowel: The colon is relatively decompressed. There is significant sigmoid colon diverticulosis. The majority of oral contrast is within the stomach, which is distended, as well as proximal small bowel, which is also distended. Mid to distal small bowel appears decompressed.Caliber transition is in the left upper quadrant, in proximal jejunum, seen best on axial image 56, and the involved portion of bowel shows evidence of wall thickening without further detail. Very little oral contrast passes beyond this point.  Vascular/Lymphatic: Extensive atherosclerotic aortoiliac calcification  Reproductive: Not identified  Other: There is pneumoperitoneum. There is a small volume of ascites throughout the abdomen and pelvis. There is edematous change in the mesentery as well as in the subcutaneous soft tissues.  Musculoskeletal: Again identified are numerous small lucencies throughout the lumbar and sacral spine which could represent osteopenia or metastases or multiple myeloma as previously described.  IMPRESSION: 1. Pneumoperitoneum implying the presence of surgical abdomen, perforation is suspected. There is a high-grade proximal small bowel obstruction as described above. 2. Small volume of ascites. Edematous change in the subcutaneous soft tissues and mesentery. Right pleural effusion. 3.  Irregular opacity right lung base again identified. Abnormal right hilar soft tissue. 4. Lucent lesions in the spine as previously described. These could reflect osteoporosis versus myeloma or metastasis. Critical  Value/emergent results were called by telephone at the time of interpretation on 01/19/2015 at 4:44 pm to Dr. Debbe Odea , who verbally acknowledged these results.   Electronically Signed   By: Skipper Cliche M.D.   On: 01/19/2015 16:45   Dg Tibia/fibula Right  01/18/2015   CLINICAL DATA:  Tibial fracture, ORIF  EXAM: DG C-ARM 61-120 MIN; RIGHT TIBIA AND FIBULA - 2 VIEW  COMPARISON:  CT RIGHT knee 01/16/2015  FLUOROSCOPY TIME:  2 minutes 35 seconds  Number of images:  4  FINDINGS: Four digital C-arm fluoroscopic images obtained intraoperatively demonstrate placement of a lateral plate and multiple screws across to a comminuted oblique fracture of the proximal RIGHT tibial metadiaphysis.  Knee joint alignment normal.  Minimally displaced RIGHT fibular neck fracture.  No additional fracture, dislocation or bone destruction.  Bones diffusely demineralized.  IMPRESSION: Post ORIF of the proximal RIGHT tibia.   Electronically Signed   By: Lavonia Dana M.D.   On: 01/18/2015 17:27   Portable Chest Xray  01/20/2015   CLINICAL DATA:  Status post sigmoid colectomy for colon perforation 01/16/2015. History of metastatic lung carcinoma.  EXAM: PORTABLE CHEST 1 VIEW  COMPARISON:  Single view of the chest 01/19/2015. CT chest 01/04/2015.  FINDINGS: Endotracheal tube and NG tube remain in place. Bilateral pulmonary nodules are again seen. Layering right pleural effusion and basilar atelectasis are again identified. Very small left pleural effusion is noted. The appearance of the chest is unchanged.  IMPRESSION: Support apparatus projects in good position.  No change in right greater than left pleural effusions and basilar atelectasis.  Bilateral pulmonary nodules consistent with metastatic disease.   Electronically Signed   By: Inge Rise M.D.   On: 01/20/2015 07:40   Dg Chest Port 1 View  01/20/2015   CLINICAL DATA:  Encounter for intubation  EXAM: PORTABLE CHEST 1 VIEW  COMPARISON:  01/08/2015  FINDINGS:  Endotracheal tube with tip just below the clavicular heads. An orogastric tube reaches the stomach.  Hazy and streaky opacity of the right chest consistent with layering pleural fluid and atelectasis seen on abdominal CT from earlier the same day. Trace left pleural effusion.  Patchy indistinct nodular airspace opacities, recently evaluated by chest CT 01/04/2015.  Normal heart size and aortic contours.  IMPRESSION: 1. Endotracheal and orogastric tubes in good position. 2. Layering right pleural effusion with atelectasis. 3. Bilateral pulmonary nodules, reference chest CT 01/04/2015   Electronically Signed   By: Monte Fantasia M.D.   On: 01/20/2015 00:08   Dg Abd Portable 1v  01/18/2015   CLINICAL DATA:  Onset of mid abdominal pain last night  EXAM: PORTABLE ABDOMEN - 1 VIEW  COMPARISON:  01/05/2015  FINDINGS: The bowel gas pattern is normal. No radio-opaque calculi or other significant radiographic abnormality are seen.  IMPRESSION: Nonobstructive bowel gas pattern.   Electronically Signed   By: Kerby Moors M.D.   On: 01/18/2015 11:15   Dg C-arm 61-120 Min  01/18/2015   CLINICAL DATA:  Tibial fracture, ORIF  EXAM: DG C-ARM 61-120 MIN; RIGHT TIBIA AND FIBULA - 2 VIEW  COMPARISON:  CT RIGHT knee 01/16/2015  FLUOROSCOPY TIME:  2 minutes 35 seconds  Number of images:  4  FINDINGS: Four digital C-arm fluoroscopic images obtained intraoperatively demonstrate placement of a lateral plate and  multiple screws across to a comminuted oblique fracture of the proximal RIGHT tibial metadiaphysis.  Knee joint alignment normal.  Minimally displaced RIGHT fibular neck fracture.  No additional fracture, dislocation or bone destruction.  Bones diffusely demineralized.  IMPRESSION: Post ORIF of the proximal RIGHT tibia.   Electronically Signed   By: Lavonia Dana M.D.   On: 01/18/2015 17:27    Anti-infectives: Anti-infectives    Start     Dose/Rate Route Frequency Ordered Stop   01/19/15 1800  piperacillin-tazobactam  (ZOSYN) IVPB 3.375 g     3.375 g 12.5 mL/hr over 240 Minutes Intravenous Every 8 hours 01/19/15 1645     01/18/15 2130  ceFAZolin (ANCEF) IVPB 2 g/50 mL premix     2 g 100 mL/hr over 30 Minutes Intravenous Every 6 hours 01/18/15 2054 01/19/15 1106      Assessment/Plan: s/p Procedure(s): EXPLORATORY LAPAROTOMY; SIGMOID COLECTOMY; END COLOSTOMY Stable and seems like she may be able ti extubate in the near future.  This is fine from surgical standpoint.  Will start wet-to-dry dressing soon.  LOS: 4 days   Kathryne Eriksson. Dahlia Bailiff, MD, FACS [phone] [facility] 01/20/2015

## 2015-01-20 NOTE — Consult Note (Signed)
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 CCM talking with patient's son about next steps and goals of care. DNR has now been established.  Patient not able to communicate well, lethargic. Son states he has a significant amount of finincial affairs that he needs to get in order and is feeling lik e"this has just happened so fast". Oncology has recommended hospice and essentially recommended no treatment options give her current level of debility and poor prognosis.  On my assessment she is labored breathing mostly unresponsive/severely lethargic, showing signs that she may be acutely declining and nearing EOL. She is mottled up to her waist and her stoma appears very dark and her abdomen is tender.  Contacts/Participants in Discussion: Primary Decision Maker: Son HCPOA: no    Code Status/Advance Care Planning:  DNR  Symptom Management:   Pain: Currently has PRN fenanyl orders-ok to continue  Palliative Prophylaxis: Delirium precautions  Additional Recommendations (Limitations, Scope, Preferences):   Psycho-social/Spiritual:   Support System: good  Desire for further Chaplaincy support:yes  Prognosis: < 2 weeks  Discharge Planning:  Home with Hospice       Chief Complaint/History of Present Illness: Abdominal Pain  Primary Diagnoses  Present on Admission:  . Tibia/fibula fracture . Hypertension . Osteoporosis . Right sided  weakness . Lung cancer, lower lobe . Brain metastasis . Protein-calorie malnutrition, severe . (Resolved) Tibia fracture  Palliative Review of Systems: Unable to obtain- patient has been declining at home, +weakness, fatigue, nausea, vomiting I have reviewed the medical record, interviewed the patient and family, and examined the patient. The following aspects are pertinent.  Past Medical History  Diagnosis Date  . Hypertension    Social History   Social History  . Marital Status: Married    Spouse Name: N/A  . Number of Children: N/A  . Years of Education: N/A   Social History Main Topics  . Smoking status: Former Smoker -- 0.50 packs/day    Quit date: 10/22/2013  . Smokeless tobacco: None  . Alcohol Use: Yes     Comment: daily  . Drug Use: No  . Sexual Activity: Not Asked   Other Topics Concern  . None   Social History Narrative   No family history on file. Scheduled Meds: . antiseptic oral rinse  7 mL Mouth Rinse QID  . chlorhexidine gluconate  15 mL Mouth Rinse BID  . Chlorhexidine Gluconate Cloth  6 each Topical Daily  . dexamethasone  4 mg Intravenous 4 times per day  . [START ON 01/21/2015] enoxaparin (LOVENOX) injection  40 mg Subcutaneous Q24H  . mupirocin ointment  1 application Nasal BID  . pantoprazole (PROTONIX) IV  40 mg Intravenous Q24H  . piperacillin-tazobactam (ZOSYN)  IV  3.375 g Intravenous Q8H   Continuous Infusions: . sodium chloride Stopped (01/20/15 1244)  . dextrose 5 % and 0.9% NaCl 150 mL/hr at 01/20/15 1243   PRN Meds:.acetaminophen **OR** acetaminophen, acetaminophen **OR** acetaminophen, fentaNYL (SUBLIMAZE) injection, menthol-cetylpyridinium **OR** phenol, methocarbamol **OR** methocarbamol (ROBAXIN)  IV, metoCLOPramide **  OR** metoCLOPramide (REGLAN) injection, metoprolol, ondansetron **OR** ondansetron (ZOFRAN) IV Medications Prior to Admission:  Prior to Admission medications   Medication Sig Start Date End Date Taking? Authorizing  Provider  alendronate (FOSAMAX) 70 MG tablet Take 1 tablet (70 mg total) by mouth every 7 (seven) days. Take with a full glass of water on an empty stomach. Patient taking differently: Take 70 mg by mouth every 7 (seven) days. Take with a full glass of water on an empty stomach on Thursdays 12/21/13  Yes Shawnee Knapp, MD  bisacodyl (DULCOLAX) 10 MG suppository Place 1 suppository (10 mg total) rectally daily. Patient taking differently: Place 10 mg rectally daily at 6 PM.  01/12/15  Yes Ripudeep K Rai, MD  calcium carbonate (OS-CAL) 600 MG TABS tablet Take 600 mg by mouth daily with breakfast.    Yes Historical Provider, MD  dexamethasone (DECADRON) 4 MG tablet Take 1 tablet (4 mg total) by mouth every 6 (six) hours. 01/12/15  Yes Ripudeep Krystal Eaton, MD  docusate sodium (COLACE) 100 MG capsule Take 1 capsule (100 mg total) by mouth 2 (two) times daily. 01/12/15  Yes Ripudeep Krystal Eaton, MD  feeding supplement, ENSURE ENLIVE, (ENSURE ENLIVE) LIQD Take 237 mLs by mouth 2 (two) times daily between meals. 01/12/15  Yes Ripudeep Krystal Eaton, MD  insulin aspart (NOVOLOG) 100 UNIT/ML injection Inject 3 Units into the skin 3 (three) times daily with meals. Patient taking differently: Inject 3 Units into the skin See admin instructions. For steroid induced hyperglycemia:  Inject 0-5 units subcutaneously BEFORE meals (CBG 0-150 0 units, >150 5 units) and inject 3 units subcutaneously 3 times daily WITH meals 01/12/15  Yes Ripudeep Krystal Eaton, MD  lisinopril-hydrochlorothiazide (PRINZIDE,ZESTORETIC) 20-25 MG per tablet take 1 tablet by mouth once daily 11/16/14  Yes Shawnee Knapp, MD  Multiple Vitamin (MULTIVITAMIN WITH MINERALS) TABS tablet Take 1 tablet by mouth daily.   Yes Historical Provider, MD  Omega-3 Fatty Acids (FISH OIL) 1000 MG CAPS Take 1,000 mg by mouth daily.    Yes Historical Provider, MD  ondansetron (ZOFRAN) 4 MG tablet Take 1 tablet (4 mg total) by mouth every 6 (six) hours as needed for nausea. 01/12/15  Yes Ripudeep Krystal Eaton,  MD  oxyCODONE (OXY IR/ROXICODONE) 5 MG immediate release tablet Take 1 tablet (5 mg total) by mouth every 6 (six) hours as needed for moderate pain. 01/12/15  Yes Ripudeep Krystal Eaton, MD  pantoprazole (PROTONIX) 40 MG tablet Take 1 tablet (40 mg total) by mouth daily. 01/12/15  Yes Ripudeep Krystal Eaton, MD  polyethylene glycol (MIRALAX / GLYCOLAX) packet Take 17 g by mouth 2 (two) times daily. 01/12/15  Yes Ripudeep Krystal Eaton, MD  traMADol (ULTRAM) 50 MG tablet Take 50 mg by mouth every 8 (eight) hours as needed (pain). 01/12/15  Yes Historical Provider, MD  verapamil (CALAN-SR) 240 MG CR tablet TAKE 1 TABLET BY MOUTH AT BEDTIME Patient taking differently: Take 240 mg by mouth at bedtime.  11/18/14  Yes Chelle Jeffery, PA-C  insulin aspart (NOVOLOG) 100 UNIT/ML injection Inject 0-15 Units into the skin 3 (three) times daily with meals. Sliding scale  CBG 70 - 120: 0 units: CBG 121 - 150: 2 units; CBG 151 - 200: 3 units; CBG 201 - 250: 5 units; CBG 251 - 300: 8 units;CBG 301 - 350: 11 units; CBG 351 - 400: 15 units; CBG > 400 : 15 units and notify MD Patient not taking: Reported on 01/16/2015 01/12/15   Ripudeep K  Rai, MD   No Known Allergies CBC:    Component Value Date/Time   WBC 4.3 01/20/2015 0235   WBC 7.2 01/01/2015 1524   HGB 9.4* 01/20/2015 0235   HGB 14.9 01/01/2015 1524   HCT 27.0* 01/20/2015 0235   HCT 45.5 01/01/2015 1524   PLT 189 01/20/2015 0235   MCV 90.6 01/20/2015 0235   MCV 93.7 01/01/2015 1524   NEUTROABS 12.9* 01/18/2015 0817   LYMPHSABS 0.3* 01/18/2015 0817   MONOABS 0.3 01/18/2015 0817   EOSABS 0.0 01/18/2015 0817   BASOSABS 0.0 01/18/2015 0817   Comprehensive Metabolic Panel:    Component Value Date/Time   NA 132* 01/20/2015 0235   K 4.3 01/20/2015 0235   CL 103 01/20/2015 0235   CO2 21* 01/20/2015 0235   BUN 32* 01/20/2015 0235   CREATININE 1.21* 01/20/2015 0235   CREATININE 0.73 01/01/2015 1508   GLUCOSE 95 01/20/2015 0235   CALCIUM 7.6* 01/20/2015 0235   AST 26 01/17/2015  0447   ALT 41 01/17/2015 0447   ALKPHOS 52 01/17/2015 0447   BILITOT 0.6 01/17/2015 0447   PROT 4.9* 01/17/2015 0447   ALBUMIN 2.5* 01/17/2015 0447    Physical Exam: Vital Signs: BP 111/59 mmHg  Pulse 112  Temp(Src) 98.7 F (37.1 C) (Axillary)  Resp 20  Ht '5\' 6"'$  (1.676 m)  Wt 60.9 kg (134 lb 4.2 oz)  BMI 21.68 kg/m2  SpO2 98% SpO2: SpO2: 98 % O2 Device: O2 Device: Nasal Cannula O2 Flow Rate: O2 Flow Rate (L/min): 4 L/min Intake/output summary:  Intake/Output Summary (Last 24 hours) at 01/20/15 1456 Last data filed at 01/20/15 1400  Gross per 24 hour  Intake 8085.35 ml  Output   2823 ml  Net 5262.35 ml   LBM: Last BM Date: 01/18/15 Baseline Weight: Weight: 52.617 kg (116 lb) Most recent weight: Weight: 60.9 kg (134 lb 4.2 oz) (weighed with 1 pillow on bed and leg brace on)  Exam Findings:  Mottled, Tachypnea, Frail, Dark stoma, abdominal tenderness         Palliative Performance Scale: 20              Additional Data Reviewed: Recent Labs     01/19/15  2325  01/20/15  0235  WBC  3.3*  4.3  HGB  9.4*  9.4*  PLT  209  189  NA  132*  132*  BUN  30*  32*  CREATININE  1.27*  1.21*     Time In: 2PM  Time Out: 250PM  Time Total: 50 Greater than 50%  of this time was spent counseling and coordinating care related to the above assessment and plan.  Signed by: Roma Schanz, DO  01/20/2015, 2:56 PM  Please contact Palliative Medicine Team phone at [phone] for questions and concerns.

## 2015-01-20 NOTE — Progress Notes (Signed)
Wasted 39m of propofol with SVista Lawman RN.

## 2015-01-20 NOTE — Progress Notes (Signed)
PULMONARY / CRITICAL CARE MEDICINE   Name: Kristin Griffin MRN: [id number] DOB: [birth year]    ADMISSION DATE:  01/16/2015 CONSULTATION DATE:  01/19/2015  REFERRING MD :  Wynelle Cleveland ([facility])  CHIEF COMPLAINT:  Bowel perforation  INITIAL PRESENTATION: 64 year old female with recent diagnosis of Stage IV adenocarcinoma presented to ED from SNF after falling in a parking lot.  Admitted with R proximal tibia and proximal fibular fracture and underwent ORIF, course complicated by perforated viscous. She was taken emergently to OR and was transferred to ICU post-operatively.   STUDIES:  9/27 CT abd > Pneumoperitoneum, perforation suspected, high grade proximal small bowel obstruction. Small ascites. Lucent lesions in spine.   SIGNIFICANT EVENTS: 9/11 > 9/20 admitted with brain mass (r sided weakness), primary unclear, but lung suspected. Nav bronch for tissue > adenocarcinoma 9/24 > fall at [facility], admitted with R leg fx 9/26 > ORIF RLE 9/27 abdominal pain> perforated viscous > to OR emergently (ex lap with sigmoid colectomy with end colostomy), out to ICU on vent.   HISTORY OF PRESENT ILLNESS:  64 year old female with PMH as below, which includes a recent admission for R sided weakness, during which she was found to have brain mass. Further diagnostic testing revealed lung masses and lucency in spine on CT chest. Lung biopsy 9/16 showed adenocarcinoma. She was discharged to [facility] 9/20 with plans for outpatient oncology follow up, however, before she could do this she suffered a fall in a parking lot. She presented to ED for this 9/24 and was found to have R proximal tibia and proximal fibular fracture. 9/25 she had abdominal cramping, but KUB was negative. Laxatives were no successful in creating bowel movement or pain relief. Once soft tissue edema subsided she went to OR 9/26 for ORIF. This procedure was without complication. 9/27 lower abdominal pain/tenderness persisted. She had not had a bowel movement.  CT was ordered and showed pneumoperitoneum, concerning for perforation, and SBO.  She was taken emergently to the OR for repair. It is thought that she will have a poor prognosis due to stage IV cancer and severe malnutrition. She decided to proceed due to the amount of pain she was experiencing. Post-operatively she was taken to ICU for further management.    SUBJECTIVE: on fent gtt  Afebrile Decreased urine output  VITAL SIGNS: Temp:  [97.7 F (36.5 C)-99.2 F (37.3 C)] 97.7 F (36.5 C) (09/28 0803) Pulse Rate:  [89-159] 107 (09/28 0900) Resp:  [9-26] 9 (09/28 0900) BP: (79-136)/(52-77) 115/65 mmHg (09/28 0900) SpO2:  [90 %-100 %] 100 % (09/28 0900) FiO2 (%):  [40 %-50 %] 40 % (09/28 0900) Weight:  [134 lb 4.2 oz (60.9 kg)] 134 lb 4.2 oz (60.9 kg) (09/28 0546) HEMODYNAMICS:   VENTILATOR SETTINGS: Vent Mode:  [-] PSV;CPAP FiO2 (%):  [40 %-50 %] 40 % Set Rate:  [16 bmp] 16 bmp Vt Set:  [470 mL] 470 mL PEEP:  [5 cmH20] 5 cmH20 Pressure Support:  [5 cmH20] 5 cmH20 Plateau Pressure:  [9 cmH20-20 cmH20] 17 cmH20 INTAKE / OUTPUT:  Intake/Output Summary (Last 24 hours) at 01/20/15 0935 Last data filed at 01/20/15 0800  Gross per 24 hour  Intake 7282.85 ml  Output   2438 ml  Net 4844.85 ml    PHYSICAL EXAMINATION: General: Appears older than stated age, resting in bed, in NAD. Neuro: RASS -1, follows commands. HEENT: [place]/AT. PERRL, sclerae anicteric. Cardiovascular: Tachy, regular, no M/R/G.  Lungs: Respirations even and unlabored.  Coarse bilaterally.  Abdomen: Midline abdominal incision with dressings C/D/I, ostomy bag in place.  BS absent.  Abd soft, non-distended. Musculoskeletal: R leg dressings C/D/I, no edema.  Skin: Intact, warm, no rashes.    LABS:  CBC  Recent Labs Lab 01/19/15 0840 01/19/15 2325 01/20/15 0235  WBC 9.1 3.3* 4.3  HGB 12.5 9.4* 9.4*  HCT 35.9* 27.2* 27.0*  PLT 283 209 189   Coag's No results for input(s): APTT, INR in the last 168  hours. BMET  Recent Labs Lab 01/19/15 0840 01/19/15 2325 01/20/15 0235  NA 134* 132* 132*  K 4.8 4.0 4.3  CL 102 101 103  CO2 24 23 21*  BUN 25* 30* 32*  CREATININE 0.93 1.27* 1.21*  GLUCOSE 92 98 95   Electrolytes  Recent Labs Lab 01/19/15 0840 01/19/15 2325 01/20/15 0235  CALCIUM 8.2* 7.5* 7.6*  MG  --   --  1.6*  PHOS  --   --  4.8*   Sepsis Markers  Recent Labs Lab 01/19/15 1725 01/19/15 2325  LATICACIDVEN 3.4* 2.0   ABG  Recent Labs Lab 01/19/15 2334  PHART 7.322*  PCO2ART 41.2  PO2ART 69.2*   Liver Enzymes  Recent Labs Lab 01/17/15 0447  AST 26  ALT 41  ALKPHOS 52  BILITOT 0.6  ALBUMIN 2.5*   Cardiac Enzymes No results for input(s): TROPONINI, PROBNP in the last 168 hours. Glucose  Recent Labs Lab 01/19/15 1201 01/19/15 1635 01/19/15 2245 01/19/15 2344 01/20/15 0345 01/20/15 0759  GLUCAP 109* 109* 94 97 87 74    Imaging Ct Abdomen Pelvis Wo Contrast  01/19/2015   CLINICAL DATA:  Abdominal pain for 3 weeks  EXAM: CT ABDOMEN AND PELVIS WITHOUT CONTRAST  TECHNIQUE: Multidetector CT imaging of the abdomen and pelvis was performed following the standard protocol without IV contrast.  COMPARISON:  01/04/2015  FINDINGS: Lower chest: New small to moderate right pleural effusion. 3 cm area of infiltrate in the peripheral aspect of the inferior right middle lobe similar to prior study. Abnormal soft tissue fullness right hilum may represent mucous plugging or adenopathy. It is not evaluated well without contrast and is only partially visualized. There is trace left pleural effusion.  Hepatobiliary: Small gallstones again identified  Pancreas: Normal  Spleen: Normal  Adrenals/Urinary Tract: No acute findings. Known low-attenuation lesion left kidney as described on prior CT scan not seen on today's non contrasted study.  Stomach/Bowel: The colon is relatively decompressed. There is significant sigmoid colon diverticulosis. The majority of oral  contrast is within the stomach, which is distended, as well as proximal small bowel, which is also distended. Mid to distal small bowel appears decompressed.Caliber transition is in the left upper quadrant, in proximal jejunum, seen best on axial image 56, and the involved portion of bowel shows evidence of wall thickening without further detail. Very little oral contrast passes beyond this point.  Vascular/Lymphatic: Extensive atherosclerotic aortoiliac calcification  Reproductive: Not identified  Other: There is pneumoperitoneum. There is a small volume of ascites throughout the abdomen and pelvis. There is edematous change in the mesentery as well as in the subcutaneous soft tissues.  Musculoskeletal: Again identified are numerous small lucencies throughout the lumbar and sacral spine which could represent osteopenia or metastases or multiple myeloma as previously described.  IMPRESSION: 1. Pneumoperitoneum implying the presence of surgical abdomen, perforation is suspected. There is a high-grade proximal small bowel obstruction as described above. 2. Small volume of ascites. Edematous change in the subcutaneous soft tissues and mesentery. Right pleural  effusion. 3. Irregular opacity right lung base again identified. Abnormal right hilar soft tissue. 4. Lucent lesions in the spine as previously described. These could reflect osteoporosis versus myeloma or metastasis. Critical Value/emergent results were called by telephone at the time of interpretation on 01/19/2015 at 4:44 pm to Dr. Debbe Odea , who verbally acknowledged these results.   Electronically Signed   By: Skipper Cliche M.D.   On: 01/19/2015 16:45   Portable Chest Xray  01/20/2015   CLINICAL DATA:  Status post sigmoid colectomy for colon perforation 01/16/2015. History of metastatic lung carcinoma.  EXAM: PORTABLE CHEST 1 VIEW  COMPARISON:  Single view of the chest 01/19/2015. CT chest 01/04/2015.  FINDINGS: Endotracheal tube and NG tube remain in  place. Bilateral pulmonary nodules are again seen. Layering right pleural effusion and basilar atelectasis are again identified. Very small left pleural effusion is noted. The appearance of the chest is unchanged.  IMPRESSION: Support apparatus projects in good position.  No change in right greater than left pleural effusions and basilar atelectasis.  Bilateral pulmonary nodules consistent with metastatic disease.   Electronically Signed   By: Inge Rise M.D.   On: 01/20/2015 07:40   Dg Chest Port 1 View  01/20/2015   CLINICAL DATA:  Encounter for intubation  EXAM: PORTABLE CHEST 1 VIEW  COMPARISON:  01/08/2015  FINDINGS: Endotracheal tube with tip just below the clavicular heads. An orogastric tube reaches the stomach.  Hazy and streaky opacity of the right chest consistent with layering pleural fluid and atelectasis seen on abdominal CT from earlier the same day. Trace left pleural effusion.  Patchy indistinct nodular airspace opacities, recently evaluated by chest CT 01/04/2015.  Normal heart size and aortic contours.  IMPRESSION: 1. Endotracheal and orogastric tubes in good position. 2. Layering right pleural effusion with atelectasis. 3. Bilateral pulmonary nodules, reference chest CT 01/04/2015   Electronically Signed   By: Monte Fantasia M.D.   On: 01/20/2015 00:08     ASSESSMENT / PLAN:  PULMONARY OETT 9/27 >>> A: VDRF in post operative setting. 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 P:   SBts , tolerates 5/5, proceed with extubation Scheduled to follow up with oncology as an outpatient in early October to discuss chemo.  CARDIOVASCULAR A:  Sinus tachycardia. Intermittent hypotension post op - likely sedation related. H/o HTN. Lactate- resoved P:  Lopressor PRN to maintain HR < 115. Off Neosynephrine  Monitor hemodynamics.   RENAL A:   Mild hyponatremia. Pseudohypocalcemia - corrects to 9.5. P:   NS @ 150 bolus for oliguria   GASTROINTESTINAL A:    Perforated Viscous s/p surgical repair 9/27. GI prophylaxis. Prot calorie malnutrition. P:   Surgery following, post op care SUP: Pantoprazole. NPO.  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 VTE prophylaxis. P:  Scheduled to follow up with oncology as an outpatient in early October. Lovenox / SCD's.   INFECTIOUS A:   Peritonitis - abundant feculent material noted throughout abdomen during surgery. P:   BCx2 9/27 > Abx: Zosyn, start date 9/27, day 1/x.  ENDOCRINE A:   No acute issues. TSH ok P:   Follow glucose  While npo    NEUROLOGIC A:   Acute metabolic encephalopathy due to sedation. 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 P:   Dc  Fentanyl gtt & change to prn Continue Decadron q 12h doses  Scheduled to follow up with oncology as an  outpatient in early October to discuss chemo.  MUSCULOSKELETAL A: R proximal tibia fracture s/p ORIF 9/26. P: Ortho following, post op care per them.  FAMILY  - Updates:  None.  - Inter-disciplinary family meet or Palliative Care meeting due by:  10/3.  The patient is critically ill with multiple organ systems failure and requires high complexity decision making for assessment and support, frequent evaluation and titration of therapies, application of advanced monitoring technologies and extensive interpretation of multiple databases. Critical Care Time devoted to patient care services described in this note independent of APP time is 35 minutes.   Kara Mead MD. Shade Flood. [facility] Pager [phone] If no response call [phone]   01/20/2015, 9:35 AM

## 2015-01-20 NOTE — Progress Notes (Signed)
OT Cancellation Note  Patient Details Name: TAREKA JHAVERI MRN: [id number] DOB: [birth year]   Cancelled Treatment:    Reason Eval/Treat Not Completed: Patient not medically ready - Per MD, family deciding wether to reintubate vs move to full comfort.   Darlina Rumpf [place], OTR/L [phone]  01/20/2015, 2:12 PM

## 2015-01-20 NOTE — Progress Notes (Signed)
   Subjective:  Patient is extubated in the ICU.  Objective:   VITALS:   Filed Vitals:   01/20/15 1100 01/20/15 1200 01/20/15 1229 01/20/15 1300  BP: 113/64 106/60  112/57  Pulse: 109 108  103  Temp:   98.7 F (37.1 C)   TempSrc:   Axillary   Resp: '20 18  11  '$ Height:      Weight:      SpO2: 100% 98%  99%    ABD soft Neurovascular intact Sensation intact distally Intact pulses distally Dorsiflexion/Plantar flexion intact Incision: dressing C/D/I and no drainage No cellulitis present Compartment soft  Brace in place   Lab Results  Component Value Date   WBC 4.3 01/20/2015   HGB 9.4* 01/20/2015   HCT 27.0* 01/20/2015   MCV 90.6 01/20/2015   PLT 189 01/20/2015     Assessment/Plan:  1 Day Post-Op   - Expected postop acute blood loss anemia - will monitor for symptoms - NWB - lovenox - dressing change ordered - patient's prognosis is bleek  Marianna Payment 01/20/2015, 1:36 PM [phone]

## 2015-01-20 NOTE — Progress Notes (Signed)
PT Cancellation Note  Patient Details Name: MARNESHA GAGEN MRN: [id number] DOB: Dec 17, 1950   Cancelled Treatment:    Reason Eval/Treat Not Completed: Patient not medically ready; patient declining and family meeting with palliative care to determine goals of treatment.   WYNN,CYNDI 01/20/2015, 3:39 PM  Magda Kiel, [place] 01/20/2015

## 2015-01-20 NOTE — Progress Notes (Signed)
Wasted 200 ml of Fentanyl. Wasted in sink. Witnessed by: Warden Fillers RN  Melinda Crutch RN

## 2015-01-20 NOTE — Procedures (Signed)
Extubation Procedure Note  Patient Details:   Name: Kristin Griffin DOB: May 15, 1950 MRN: [id number]   Airway Documentation:  Airway 7.5 mm (Active)  Secured at (cm) 21 cm 01/20/2015  7:52 AM  Measured From Lips 01/20/2015  7:52 AM  [place] 01/20/2015  7:52 AM  Secured By [employer] 01/20/2015  7:52 AM  Tube Holder Repositioned Yes 01/20/2015  7:52 AM  Cuff Pressure (cm H2O) 25 cm H2O 01/20/2015  3:09 AM  Site Condition Cool;Dry 01/20/2015  7:52 AM    Evaluation  O2 sats: stable throughout Complications: No apparent complications Patient did tolerate procedure well. Bilateral Breath Sounds: Diminished, Rhonchi Suctioning: Oral Yes   Patient was extubated to a 4L [place]. Cuff leak was heard. No stridor noted. BS were clear and diminished with slight rhonchi in the upper right base. RN was at bedside with RT. Patient currently has a sat of 97. RT will continue to monitor.  Tiburcio Bash 01/20/2015, 9:58 AM

## 2015-01-20 NOTE — Progress Notes (Addendum)
I received a call from Kristin Griffin's brother, Kristin Griffin today. He shared that Kristin Griffin fell a few days ago and suffered a fracture to her Rt. Tib/Fib. She was admitted to [facility] and later had surgery on 9/26. Then on 9/27 she was taken back in for emergent surgery to repair a perforated bowel. She is currently in ICU at [facility]. She is not doing very well. Her brother called to cancel Kristin Griffin's upcoming appointments for the planning and delivery of SRS to her brain.   I cancelled her MRI and informed Dr. Annette Griffin and Dr. Lisbeth Griffin of her current condition. Her SIM and treatments have been cancelled as well.   I shared her name to Kristin Griffin requesting a palliative care consult as an IP. The family is very interested in setting something up for a power of attorney to be signed over to her brother Kristin Griffin.   Contact information: Sheppard Plumber) [place]  ( Brother) [phone] Kristin Griffin  [phone]  Kristin Griffin R.T.(R).(T). Radiation Oncology Special Procedures Navigator

## 2015-01-20 NOTE — Progress Notes (Signed)
PT Cancellation Note  Patient Details Name: TANNYA GONET MRN: [id number] DOB: [birth year]   Cancelled Treatment:    Reason Eval/Treat Not Completed: Patient not medically ready. Pt recently extubated. Will attempt in pm as schedule allows.   SASSER,LYNN 01/20/2015, 10:58 AM  Pager [phone]

## 2015-01-20 NOTE — Progress Notes (Signed)
Pt's blood sugar was 62 at 1200 check. MD was informed and an order D5NS was placed. Upon recheck, Pt's blood sugar was 84. Will continue to monitor patient closely.   Melinda Crutch RN

## 2015-01-21 ENCOUNTER — Encounter (HOSPITAL_COMMUNITY): Payer: Self-pay | Admitting: Orthopaedic Surgery

## 2015-01-21 LAB — CALCIUM, IONIZED: CALCIUM, IONIZED, SERUM: 4.5 mg/dL (ref 4.5–5.6)

## 2015-01-21 LAB — BASIC METABOLIC PANEL
Anion gap: 9 (ref 5–15)
BUN: 35 mg/dL — AB (ref 6–20)
CHLORIDE: 110 mmol/L (ref 101–111)
CO2: 19 mmol/L — ABNORMAL LOW (ref 22–32)
Calcium: 7.9 mg/dL — ABNORMAL LOW (ref 8.9–10.3)
Creatinine, Ser: 1.55 mg/dL — ABNORMAL HIGH (ref 0.44–1.00)
GFR calc Af Amer: 39 mL/min — ABNORMAL LOW (ref >60)
GFR, EST NON AFRICAN AMERICAN: 34 mL/min — AB (ref >60)
GLUCOSE: 169 mg/dL — AB (ref 65–99)
Potassium: 3.7 mmol/L (ref 3.5–5.1)
Sodium: 138 mmol/L (ref 135–145)

## 2015-01-21 LAB — GLUCOSE, CAPILLARY
GLUCOSE-CAPILLARY: 155 mg/dL — AB (ref 65–99)
GLUCOSE-CAPILLARY: 140 mg/dL — AB (ref 65–99)
GLUCOSE-CAPILLARY: 108 mg/dL — AB (ref 65–99)
Glucose-Capillary: 117 mg/dL — ABNORMAL HIGH (ref 65–99)

## 2015-01-21 LAB — CBC
HCT: 23.3 % — ABNORMAL LOW (ref 36.0–46.0)
Hemoglobin: 8.2 g/dL — ABNORMAL LOW (ref 12.0–15.0)
MCH: 31.5 pg (ref 26.0–34.0)
MCHC: 35.2 g/dL (ref 30.0–36.0)
MCV: 89.6 fL (ref 78.0–100.0)
PLATELETS: 162 10*3/uL (ref 150–400)
RBC: 2.6 MIL/uL — AB (ref 3.87–5.11)
RDW: 14.1 % (ref 11.5–15.5)
WBC: 10.4 10*3/uL (ref 4.0–10.5)

## 2015-01-21 MED ORDER — DEXAMETHASONE SODIUM PHOSPHATE 4 MG/ML IJ SOLN
4.0000 mg | Freq: Two times a day (BID) | INTRAMUSCULAR | Status: DC
  Administered 2015-01-21 – 2015-01-23 (×4): 4 mg via INTRAVENOUS
  Filled 2015-01-21 (×5): qty 1

## 2015-01-21 NOTE — Consult Note (Signed)
Continued goals of care at bedside with patient and her son Kristin Griffin.  Kristin Griffin has been up out of bed for a few hours this afternoon and just returned to bed with staff assistance.  She is alert and oriented and using her flutter valve respiratory device.  No acute respiratory distress noted and she denies pain at this time.  The conversation centered around the recent metastatic cancer diagnosis, leading to her stay in SNF and subsequent orthopedic and then abdominal surgeries.  Kristin Griffin stated that while she was in the SNF she had already decided that she did not want to undergo any chemotherapy or radiation treatment.  She acknowledges that she understands this may lead to and end of life occurrence more quickly than she or her son had anticipated.  Kristin Griffin (son) verbalizes that of course he would want to see her fight and have treatment for the cancer he ultimately wants her wishes to be honored.    We discussed the mounting complications of this hospitalization.  Kristin Griffin states that her short term goal for the next days to weeks would be to be able to drink liquids on her own and be able to stand to get in a chair.  This would achieve quality for her knowing that the advancing disease will lead to an ultimate decline.  She is able to verbalize agreement to her DNR status and the definition being that if she suffered a cardiac or respiratory arrest she would not want to be placed on life support but allowed to experience a natural death.  Advanced care planning documentation was discussed and explained.  Kristin Griffin would like for Kristin Griffin to be her HCPOA, paperwork will be completed hopefully tomorrow.  Kristin Griffin is visiting from [place] and will most likely be her through the weekend.  Kristin Griffin has a few sibilings and in-laws here locally who participate in her social life.  Plan:  Kristin Griffin would like to progress to SNF placement again to achieve some short term goals and continue discussion regarding her wishes related to end of  life.  I will complete HCPOA paperwork with Kristin Griffin on Friday and continue to support as needed.  Kristin Fantasia, RN-BC, MSN, [facility] Palliative Care

## 2015-01-21 NOTE — Progress Notes (Signed)
Pt transferred to 5N04 with belongings. Report given to receiving RN and all questions answered. VSS during transfer.  Pt assisted to bed in new room. Family at bedside.

## 2015-01-21 NOTE — Clinical Social Work Note (Signed)
CSW faxed updated PT notes to [facility], per note with palliative patient would like to continue to progress at [facility].  CSW to continue to follow patient's progress.  Jones Broom. Gleed, MSW, [place] 01/21/2015 4:49 PM

## 2015-01-21 NOTE — Progress Notes (Signed)
[facility] Progress Note 2 Days Post-Op  Subjective: The patient just wants to go home.  She says that even if it is just for 2-3 days, she wants to go home.  Objective: Vital signs in last 24 hours: Temp:  [97.8 F (36.6 C)-98.7 F (37.1 C)] 97.9 F (36.6 C) (09/29 0814) Pulse Rate:  [102-117] 109 (09/29 0600) Resp:  [9-22] 19 (09/29 0600) BP: (104-136)/(57-78) 136/78 mmHg (09/29 0600) SpO2:  [87 %-100 %] 98 % (09/29 0600) FiO2 (%):  [40 %-50 %] 50 % (09/29 0600) Last BM Date: 01/18/15  Intake/Output from previous day: 09/28 0701 - 09/29 0700 In: 3415 [I.V.:3255; NG/GT:10; IV Piggyback:150] Out: 1700 [Urine:1160; Emesis/NG output:540] Intake/Output this shift: Total I/O In: 150 [I.V.:150] Out: 120 [Urine:120]  General: No acute distress.  Needs oxygen to maintain her sats.  Lungs: Coarse rhonchorous breath sounds bilaterally.  Abd: Distended with hypoactive bowel sounds, but minimal output from NGT.  Will DC NGT and allow the patient to have ice chips.  Extremities: No changes  Neuro: Intact  Lab Results:  _0 (wbc:2,hgb:2,hct:2,plt:2) BMET ) Recent Labs  01/20/15 0235 01/21/15 0255  NA 132* 138  K 4.3 3.7  CL 103 110  CO2 21* 19*  GLUCOSE 95 169*  BUN 32* 35*  CREATININE 1.21* 1.55*  CALCIUM 7.6* 7.9*   PT/INR No results for input(s): LABPROT, INR in the last 72 hours. ABG  Recent Labs  01/19/15 2334  PHART 7.322*  HCO3 20.6    Studies/Results: Ct Abdomen Pelvis Wo Contrast  01/19/2015   CLINICAL DATA:  Abdominal pain for 3 weeks  EXAM: CT ABDOMEN AND PELVIS WITHOUT CONTRAST  TECHNIQUE: Multidetector CT imaging of the abdomen and pelvis was performed following the standard protocol without IV contrast.  COMPARISON:  01/04/2015  FINDINGS: Lower chest: New small to moderate right pleural effusion. 3 cm area of infiltrate in the peripheral aspect of the inferior right middle lobe similar to prior study. Abnormal soft tissue fullness right hilum  may represent mucous plugging or adenopathy. It is not evaluated well without contrast and is only partially visualized. There is trace left pleural effusion.  Hepatobiliary: Small gallstones again identified  Pancreas: Normal  Spleen: Normal  Adrenals/Urinary Tract: No acute findings. Known low-attenuation lesion left kidney as described on prior CT scan not seen on today's non contrasted study.  Stomach/Bowel: The colon is relatively decompressed. There is significant sigmoid colon diverticulosis. The majority of oral contrast is within the stomach, which is distended, as well as proximal small bowel, which is also distended. Mid to distal small bowel appears decompressed.Caliber transition is in the left upper quadrant, in proximal jejunum, seen best on axial image 56, and the involved portion of bowel shows evidence of wall thickening without further detail. Very little oral contrast passes beyond this point.  Vascular/Lymphatic: Extensive atherosclerotic aortoiliac calcification  Reproductive: Not identified  Other: There is pneumoperitoneum. There is a small volume of ascites throughout the abdomen and pelvis. There is edematous change in the mesentery as well as in the subcutaneous soft tissues.  Musculoskeletal: Again identified are numerous small lucencies throughout the lumbar and sacral spine which could represent osteopenia or metastases or multiple myeloma as previously described.  IMPRESSION: 1. Pneumoperitoneum implying the presence of surgical abdomen, perforation is suspected. There is a high-grade proximal small bowel obstruction as described above. 2. Small volume of ascites. Edematous change in the subcutaneous soft tissues and mesentery. Right pleural effusion. 3. Irregular opacity right lung base again identified. Abnormal right  hilar soft tissue. 4. Lucent lesions in the spine as previously described. These could reflect osteoporosis versus myeloma or metastasis. Critical Value/emergent  results were called by telephone at the time of interpretation on 01/19/2015 at 4:44 pm to Dr. Debbe Odea , who verbally acknowledged these results.   Electronically Signed   By: Skipper Cliche M.D.   On: 01/19/2015 16:45   Portable Chest Xray  01/20/2015   CLINICAL DATA:  Status post sigmoid colectomy for colon perforation 01/16/2015. History of metastatic lung carcinoma.  EXAM: PORTABLE CHEST 1 VIEW  COMPARISON:  Single view of the chest 01/19/2015. CT chest 01/04/2015.  FINDINGS: Endotracheal tube and NG tube remain in place. Bilateral pulmonary nodules are again seen. Layering right pleural effusion and basilar atelectasis are again identified. Very small left pleural effusion is noted. The appearance of the chest is unchanged.  IMPRESSION: Support apparatus projects in good position.  No change in right greater than left pleural effusions and basilar atelectasis.  Bilateral pulmonary nodules consistent with metastatic disease.   Electronically Signed   By: Inge Rise M.D.   On: 01/20/2015 07:40   Dg Chest Port 1 View  01/20/2015   CLINICAL DATA:  Encounter for intubation  EXAM: PORTABLE CHEST 1 VIEW  COMPARISON:  01/08/2015  FINDINGS: Endotracheal tube with tip just below the clavicular heads. An orogastric tube reaches the stomach.  Hazy and streaky opacity of the right chest consistent with layering pleural fluid and atelectasis seen on abdominal CT from earlier the same day. Trace left pleural effusion.  Patchy indistinct nodular airspace opacities, recently evaluated by chest CT 01/04/2015.  Normal heart size and aortic contours.  IMPRESSION: 1. Endotracheal and orogastric tubes in good position. 2. Layering right pleural effusion with atelectasis. 3. Bilateral pulmonary nodules, reference chest CT 01/04/2015   Electronically Signed   By: Monte Fantasia M.D.   On: 01/20/2015 00:08    Anti-infectives: Anti-infectives    Start     Dose/Rate Route Frequency Ordered Stop   01/19/15 1800   piperacillin-tazobactam (ZOSYN) IVPB 3.375 g     3.375 g 12.5 mL/hr over 240 Minutes Intravenous Every 8 hours 01/19/15 1645     01/18/15 2130  ceFAZolin (ANCEF) IVPB 2 g/50 mL premix     2 g 100 mL/hr over 30 Minutes Intravenous Every 6 hours 01/18/15 2054 01/19/15 1106      Assessment/Plan: s/p Procedure(s): EXPLORATORY LAPAROTOMY; SIGMOID COLECTOMY; END COLOSTOMY Discontinue NGT  Palliative care consultation. Per the patient's request, try to get her home as soon as possible.  LOS: 5 days   Kathryne Eriksson. Dahlia Bailiff, MD, FACS [phone] [facility] 01/21/2015

## 2015-01-21 NOTE — Progress Notes (Signed)
Physical Therapy Treatment Patient Details Name: Kristin Griffin MRN: [id number] DOB: December 07, 1950 Today's Date: 01/21/2015    History of Present Illness 64 y.o.-year-old female who sustained a right proximal tibial metaphyseal fracture from a mechanical fall s/p ORIF     PT Comments    Patient eager to do "anything that will help me improve and get out of here." Briskly following commands. OOB to chair with +2 assist and RW. Patient with TDWB on RLE as she could not maintain holding Rt leg up in NWB. Noted plans to discuss goals of care with Palliative Care team. Will follow their lead re: discharge disposition.   Follow Up Recommendations  Other (comment) (family to meet with Palliative Care for [place])     Equipment Recommendations  Other (comment) (TBA)    Recommendations for Other Services       Precautions / Restrictions Precautions Precautions: Fall Required Braces or Orthoses: Other Brace/Splint Other Brace/Splint: Bledsoe locked Rt LE Restrictions Weight Bearing Restrictions: Yes RLE Weight Bearing: Non weight bearing    Mobility  Bed Mobility Overal bed mobility: Needs Assistance Bed Mobility: Supine to Sit     Supine to sit: Max assist;HOB elevated;+2 for safety/equipment     General bed mobility comments: needing assist at RLE and trunk as well as cues for sequence; able to use LLE in supine to bridge/scoot to EOB prior to sitting  Transfers Overall transfer level: Needs assistance Equipment used: Rolling walker (2 wheeled) Transfers: Sit to/from [facility] Sit to Stand: Mod assist;+2 physical assistance;+2 safety/equipment Stand pivot transfers: Mod assist;+2 physical assistance;+2 safety/equipment       General transfer comment: stand pivot to Lt to sit in recliner.Pt able to "shimmy" pivot on LLE; RLE TDWB throughout (pt unable to hold in NWB)  Ambulation/Gait                 Stairs            Wheelchair Mobility     Modified Rankin (Stroke Patients Only)       Balance Overall balance assessment: History of Falls;Needs assistance Sitting-balance support: Bilateral upper extremity supported;Feet supported Sitting balance-Leahy Scale: Poor Sitting balance - Comments: loses balance posteriorly; min assist to maintain   Standing balance support: Bilateral upper extremity supported Standing balance-Leahy Scale: Poor                      Cognition Arousal/Alertness: Awake/alert Behavior During Therapy: WFL for tasks assessed/performed Overall Cognitive Status: Within Functional Limits for tasks assessed                      Exercises General Exercises - Lower Extremity Ankle Circles/Pumps: AROM;Both;5 reps;Supine;Seated (pt able to return demo "homework" once seated) Heel Slides: AROM;Left;10 reps;Supine (prior to sit EOB)    General Comments        Pertinent Vitals/Pain Pain Assessment: 0-10 Pain Score: 5  Pain Location: abd and RLE Pain Descriptors / Indicators: Operative site guarding Pain Intervention(s): Limited activity within patient's tolerance;Monitored during session;Premedicated before session;Repositioned    Home Living                      Prior Function            PT Goals (current goals can now be found in the care plan section) Acute Rehab PT Goals Patient Stated Goal: move better so I can go home PT Goal Formulation: With patient/family Time For  Goal Achievement: 02/02/15 Progress towards PT goals: Progressing toward goals    Frequency  Min 3X/week    PT Plan Other (comment) (await family meeting with Palliative Care)    Co-evaluation             End of Session Equipment Utilized During Treatment: Gait belt;Oxygen (Rt bledsoe brace) Activity Tolerance: Patient tolerated treatment well Patient left: in chair;with call bell/phone within reach     Time: 0941-1010 PT Time Calculation (min) (ACUTE ONLY): 29 min  Charges:   $Therapeutic Activity: 23-37 mins                    G Codes:      SASSER,LYNN 2015-01-28, 10:24 AM Pager [phone]

## 2015-01-21 NOTE — Progress Notes (Signed)
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 She is mostly unresponsive and appears to be acutely declning- will follow for support and transition comfort care when appropriate- per primary team oncology will not consider treatment given her current condition.  Full consult to follow.  Lane Hacker, DO Palliative Medicine [phone]

## 2015-01-21 NOTE — Progress Notes (Addendum)
PULMONARY / CRITICAL CARE MEDICINE   Name: Kristin Griffin MRN: [id number] DOB: 1950/08/23    ADMISSION DATE:  01/16/2015 CONSULTATION DATE:  01/19/2015  REFERRING MD :  Wynelle Cleveland ([facility])  CHIEF COMPLAINT:  Bowel perforation  INITIAL PRESENTATION: 64 year old female with recent diagnosis of Stage IV adenocarcinoma presented to ED from SNF after falling in a parking lot.  Admitted with R proximal tibia and proximal fibular fracture and underwent ORIF, course complicated by perforated viscous. She was taken emergently to OR and was transferred to ICU post-operatively.   STUDIES:  9/27 CT abd > Pneumoperitoneum, perforation suspected, high grade proximal small bowel obstruction. Small ascites. Lucent lesions in spine.   SIGNIFICANT EVENTS: 9/11 > 9/20 admitted with brain mass (r sided weakness), primary unclear, but lung suspected. Nav bronch for tissue > adenocarcinoma 9/24 > fall at [facility], admitted with R leg fx 9/26 > ORIF RLE 9/27 abdominal pain> perforated colon  > to OR emergently (ex lap with sigmoid colectomy with end colostomy), out to ICU on vent.   HISTORY OF PRESENT ILLNESS:  64 year old female with PMH as below, which includes a recent admission for R sided weakness, during which she was found to have brain mass. Further diagnostic testing revealed lung masses and lucency in spine on CT chest. Lung biopsy 9/16 showed adenocarcinoma. She was discharged to [facility] 9/20 with plans for outpatient oncology follow up, however, before she could do this she suffered a fall in a parking lot. She presented to ED for this 9/24 and was found to have R proximal tibia and proximal fibular fracture. 9/25 she had abdominal cramping, but KUB was negative. Laxatives were no successful in creating bowel movement or pain relief. Once soft tissue edema subsided she went to OR 9/26 for ORIF. This procedure was without complication. 9/27 lower abdominal pain/tenderness persisted. She had not had a bowel movement.  CT was ordered and showed pneumoperitoneum, concerning for perforation, and SBO.  She was taken emergently to the OR for repair. It is thought that she will have a poor prognosis due to stage IV cancer and severe malnutrition. She decided to proceed due to the amount of pain she was experiencing. Post-operatively she was taken to ICU for further management.    SUBJECTIVE:  Afebrile improving urine output Son at bedside  VITAL SIGNS: Temp:  [97.8 F (36.6 C)-98.7 F (37.1 C)] 97.9 F (36.6 C) (09/29 0814) Pulse Rate:  [102-117] 109 (09/29 0600) Resp:  [10-22] 19 (09/29 0600) BP: (104-136)/(57-78) 136/78 mmHg (09/29 0600) SpO2:  [87 %-100 %] 98 % (09/29 0600) FiO2 (%):  [50 %] 50 % (09/29 0600) HEMODYNAMICS:   VENTILATOR SETTINGS: Vent Mode:  [-]  FiO2 (%):  [50 %] 50 % INTAKE / OUTPUT:  Intake/Output Summary (Last 24 hours) at 01/21/15 0925 Last data filed at 01/21/15 0800  Gross per 24 hour  Intake 3452.5 ml  Output   1780 ml  Net 1672.5 ml    PHYSICAL EXAMINATION: General: Appears older than stated age, resting in bed, in NAD. Neuro: interactive, follows commands, non focal HEENT: [place]/AT. PERRL, sclerae anicteric. Cardiovascular: Tachy, regular, no M/R/G.  Lungs: Respirations even and unlabored.  Coarse bilaterally. Abdomen: Midline abdominal incision with dressings C/D/I, ostomy bag in place.  BS absent.  Abd soft, non-distended. Musculoskeletal: R leg dressings C/D/I, no edema.  Skin: Intact, warm, no rashes.    LABS:  CBC  Recent Labs Lab 01/19/15 2325 01/20/15 0235 01/21/15 0255  WBC 3.3* 4.3  10.4  HGB 9.4* 9.4* 8.2*  HCT 27.2* 27.0* 23.3*  PLT 209 189 162   Coag's No results for input(s): APTT, INR in the last 168 hours. BMET  Recent Labs Lab 01/19/15 2325 01/20/15 0235 01/21/15 0255  NA 132* 132* 138  K 4.0 4.3 3.7  CL 101 103 110  CO2 23 21* 19*  BUN 30* 32* 35*  CREATININE 1.27* 1.21* 1.55*  GLUCOSE 98 95 169*    Electrolytes  Recent Labs Lab 01/19/15 2325 01/20/15 0235 01/21/15 0255  CALCIUM 7.5* 7.6* 7.9*  MG  --  1.6*  --   PHOS  --  4.8*  --    Sepsis Markers  Recent Labs Lab 01/19/15 1725 01/19/15 2325  LATICACIDVEN 3.4* 2.0   ABG  Recent Labs Lab 01/19/15 2334  PHART 7.322*  PCO2ART 41.2  PO2ART 69.2*   Liver Enzymes  Recent Labs Lab 01/17/15 0447  AST 26  ALT 41  ALKPHOS 52  BILITOT 0.6  ALBUMIN 2.5*   Cardiac Enzymes No results for input(s): TROPONINI, PROBNP in the last 168 hours. Glucose  Recent Labs Lab 01/20/15 0759 01/20/15 1228 01/20/15 1344 01/20/15 1632 01/20/15 2150 01/21/15 0810  GLUCAP 74 62* 84 122* 151* 155*    Imaging No results found.   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 P:   duonebs  CARDIOVASCULAR A:  Sinus tachycardia. Intermittent hypotension post op - likely sedation related. H/o HTN. Lactate- resoved P:  Lopressor PRN to maintain HR < 115.   RENAL A:   Mild hyponatremia -resolved Pseudohypocalcemia - corrects to 9.5. P:   NS @ 75   GASTROINTESTINAL A:   Perforated Viscous s/p surgical repair 9/27. GI prophylaxis. Prot calorie malnutrition. P:   Surgery following, post op care SUP: Pantoprazole. NPO.  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 VTE prophylaxis. P:  Scheduled to follow up with oncology as an outpatient in early October. Lovenox / SCD's.   INFECTIOUS A:   Peritonitis - abundant feculent material noted throughout abdomen during surgery. P:   BCx2 9/27 > Urine 9/27 >> ng Abx: Zosyn, start date 9/27>>  ENDOCRINE A:   No acute issues. TSH ok P:   Follow glucose  While npo    NEUROLOGIC A:   Acute metabolic encephalopathy due to sedation. 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 P:   Fentanyl prn Continue  Decadron q 12h Scheduled to follow up with oncology as an outpatient in early October to discuss chemo.  MUSCULOSKELETAL A: R proximal tibia fracture s/p ORIF 9/26. P: Ortho following, post op care. PT when able  FAMILY  - Updates: son @ bedside - he understands prognosis, if terminal ,she would like to die at home but would agree to give rehab a shot. Son upset that constipation (with narcotics) was not addressed piror & may have caused perforation but is reconciled to current course   - Inter-disciplinary family meet or Palliative Care meeting due by:  Done 9/28  Summary - has been extubated post colectomy & ostomy, but still has a long road to recovery from hip fracture, certainly cannot start on rx for metastatic cancer & not a candidate for more surgery should bowel not recover.  Transfer to tele & to [facility] , palliative care following   Kara Mead MD. [facility] [place] [facility]  [facility] Pager [phone] If no response call [phone]   01/21/2015, 9:25 AM

## 2015-01-22 ENCOUNTER — Inpatient Hospital Stay: Admit: 2015-01-22 | Payer: Self-pay

## 2015-01-22 ENCOUNTER — Encounter (HOSPITAL_COMMUNITY): Payer: Self-pay | Admitting: General Practice

## 2015-01-22 ENCOUNTER — Ambulatory Visit: Payer: No Typology Code available for payment source | Admitting: Radiation Oncology

## 2015-01-22 ENCOUNTER — Inpatient Hospital Stay: Admit: 2015-01-22 | Payer: Medicare Other | Admitting: Radiation Oncology

## 2015-01-22 ENCOUNTER — Encounter (HOSPITAL_COMMUNITY): Payer: Self-pay

## 2015-01-22 DIAGNOSIS — L8990 Pressure ulcer of unspecified site, unspecified stage: Secondary | ICD-10-CM

## 2015-01-22 DIAGNOSIS — Z66 Do not resuscitate: Secondary | ICD-10-CM

## 2015-01-22 LAB — GLUCOSE, CAPILLARY
GLUCOSE-CAPILLARY: 96 mg/dL (ref 65–99)
GLUCOSE-CAPILLARY: 123 mg/dL — AB (ref 65–99)
GLUCOSE-CAPILLARY: 166 mg/dL — AB (ref 65–99)
Glucose-Capillary: 142 mg/dL — ABNORMAL HIGH (ref 65–99)

## 2015-01-22 LAB — BASIC METABOLIC PANEL
Anion gap: 8 (ref 5–15)
BUN: 36 mg/dL — AB (ref 6–20)
CO2: 22 mmol/L (ref 22–32)
CREATININE: 1.63 mg/dL — AB (ref 0.44–1.00)
Calcium: 8 mg/dL — ABNORMAL LOW (ref 8.9–10.3)
Chloride: 108 mmol/L (ref 101–111)
GFR calc non Af Amer: 32 mL/min — ABNORMAL LOW (ref >60)
GFR, EST AFRICAN AMERICAN: 37 mL/min — AB (ref >60)
Glucose, Bld: 114 mg/dL — ABNORMAL HIGH (ref 65–99)
Potassium: 3.4 mmol/L — ABNORMAL LOW (ref 3.5–5.1)
SODIUM: 138 mmol/L (ref 135–145)

## 2015-01-22 LAB — CBC
HCT: 24.1 % — ABNORMAL LOW (ref 36.0–46.0)
Hemoglobin: 8.4 g/dL — ABNORMAL LOW (ref 12.0–15.0)
MCH: 31.6 pg (ref 26.0–34.0)
MCHC: 34.9 g/dL (ref 30.0–36.0)
MCV: 90.6 fL (ref 78.0–100.0)
PLATELETS: 125 10*3/uL — AB (ref 150–400)
RBC: 2.66 MIL/uL — AB (ref 3.87–5.11)
RDW: 14.1 % (ref 11.5–15.5)
WBC: 13.8 10*3/uL — AB (ref 4.0–10.5)

## 2015-01-22 MED ORDER — CHLORHEXIDINE GLUCONATE 0.12 % MT SOLN
15.0000 mL | Freq: Two times a day (BID) | OROMUCOSAL | Status: DC
  Administered 2015-01-22 – 2015-01-24 (×5): 15 mL via OROMUCOSAL
  Filled 2015-01-22 (×4): qty 15

## 2015-01-22 MED ORDER — AMOXICILLIN-POT CLAVULANATE 875-125 MG PO TABS
1.0000 | ORAL_TABLET | Freq: Two times a day (BID) | ORAL | Status: DC
  Administered 2015-01-22 – 2015-01-24 (×5): 1 via ORAL
  Filled 2015-01-22 (×6): qty 1

## 2015-01-22 MED ORDER — VERAPAMIL HCL ER 240 MG PO TBCR
240.0000 mg | EXTENDED_RELEASE_TABLET | Freq: Every day | ORAL | Status: DC
  Administered 2015-01-22 – 2015-01-23 (×2): 240 mg via ORAL
  Filled 2015-01-22 (×3): qty 1

## 2015-01-22 MED ORDER — ZOLPIDEM TARTRATE 5 MG PO TABS
5.0000 mg | ORAL_TABLET | Freq: Every day | ORAL | Status: DC
  Administered 2015-01-22 – 2015-01-23 (×2): 5 mg via ORAL
  Filled 2015-01-22 (×2): qty 1

## 2015-01-22 MED ORDER — OXYCODONE-ACETAMINOPHEN 5-325 MG PO TABS
1.0000 | ORAL_TABLET | ORAL | Status: DC | PRN
  Administered 2015-01-22: 1 via ORAL
  Filled 2015-01-22: qty 1

## 2015-01-22 MED ORDER — ENSURE ENLIVE PO LIQD
237.0000 mL | Freq: Two times a day (BID) | ORAL | Status: DC
  Administered 2015-01-22 – 2015-01-24 (×5): 237 mL via ORAL

## 2015-01-22 NOTE — Care Management Important Message (Signed)
Important Message  Patient Details  Name: Kristin Griffin MRN: [id number] Date of Birth: 06-04-1950   Medicare Important Message Given:  Yes-third notification given    Delorse Lek 01/22/2015, 3:12 PM

## 2015-01-22 NOTE — Progress Notes (Signed)
Nutrition Brief Note  Patient identified on the Malnutrition Screening Tool (MST) Report. Chart reviewed. Palliative Care team is following. No plans for aggressive treatment of newly dx metastatic lung CA at this time. May transfer to SNF to achieve some short term goals.   Wt Readings from Last 15 Encounters:  01/20/15 134 lb 4.2 oz (60.9 kg)  01/03/15 105 lb 2.6 oz (47.7 kg)  02/03/14 112 lb 12.8 oz (51.166 kg)  11/21/13 113 lb (51.256 kg)  09/09/13 144 lb (65.318 kg)    Body mass index is 21.68 kg/(m^2). Patient meets criteria for normal weight based on current BMI.   Current diet order is full liquids. Patient unable to take much PO. Labs and medications reviewed. Will order oral nutrition supplement to maximize oral intake.   Please consult RD for further nutrition needs.    Molli Barrows, RD, LDN, [place] Pager [phone] After Hours Pager [phone]

## 2015-01-22 NOTE — Discharge Instructions (Signed)
1. Change dressings as needed 2. May shower but keep incisions covered and dry 3. Take lovenox to prevent blood clots 4. Take stool softeners as needed 5. Take pain meds as needed 6. Wear the brace at all times  ABDOMINAL SURGERY: POST OP INSTRUCTIONS  1. DIET: Follow a light bland diet the first 24 hours after arrival home, such as soup, liquids, crackers, etc.  Be sure to include lots of fluids daily.  Avoid fast food or heavy meals as your are more likely to get nauseated.  Eat a low fat the next few days after surgery.   2. Take your usually prescribed home medications unless otherwise directed. 3. PAIN CONTROL: a. Pain is best controlled by a usual combination of three different methods TOGETHER: i. Ice/Heat ii. Over the counter pain medication iii. Prescription pain medication b. Most patients will experience some swelling and bruising around the incisions.  Ice packs or heating pads (30-60 minutes up to 6 times a day) will help. Use ice for the first few days to help decrease swelling and bruising, then switch to heat to help relax tight/sore spots and speed recovery.  Some people prefer to use ice alone, heat alone, alternating between ice & heat.  Experiment to what works for you.  Swelling and bruising can take several weeks to resolve.   c. It is helpful to take an over-the-counter pain medication regularly for the first few weeks.  Choose one of the following that works best for you: i. Naproxen (Aleve, etc)  Two '220mg'$  tabs twice a day ii. Ibuprofen (Advil, etc) Three '200mg'$  tabs four times a day (every meal & bedtime) iii. Acetaminophen (Tylenol, etc) 500-'650mg'$  four times a day (every meal & bedtime) d. A  prescription for pain medication (such as oxycodone, hydrocodone, etc) should be given to you upon discharge.  Take your pain medication as prescribed.  i. If you are having problems/concerns with the prescription medicine (does not control pain, nausea, vomiting, rash, itching,  etc), please call us [phone] to see if we need to switch you to a different pain medicine that will work better for you and/or control your side effect better. ii. If you need a refill on your pain medication, please contact your pharmacy.  They will contact our office to request authorization. Prescriptions will not be filled after 5 pm or on week-ends. 4. Avoid getting constipated.  Between the surgery and the pain medications, it is common to experience some constipation.  Increasing fluid intake and taking a fiber supplement (such as Metamucil, Citrucel, FiberCon, MiraLax, etc) 1-2 times a day regularly will usually help prevent this problem from occurring.  A mild laxative (prune juice, Milk of Magnesia, MiraLax, etc) should be taken according to package directions if there are no bowel movements after 48 hours.   5. Watch out for diarrhea.  If you have many loose bowel movements, simplify your diet to bland foods & liquids for a few days.  Stop any stool softeners and decrease your fiber supplement.  Switching to mild anti-diarrheal medications (Kayopectate, Pepto Bismol) can help.  If this worsens or does not improve, please call us. 6. Wash / shower every day.  You may shower over the incision / wound.  Avoid baths until the skin is fully healed.  Continue to shower over incision(s) after the dressing is off. 7. Remove your waterproof bandages 5 days after surgery.  You may leave the incision open to air.  You may replace a dressing/Band-Aid  to cover the incision for comfort if you wish. 8. ACTIVITIES as tolerated:   a. You may resume regular (light) daily activities beginning the next day--such as daily self-care, walking, climbing stairs--gradually increasing activities as tolerated.  If you can walk 30 minutes without difficulty, it is safe to try more intense activity such as jogging, treadmill, bicycling, low-impact aerobics, swimming, etc. b. Save the most intensive and strenuous  activity for last such as sit-ups, heavy lifting, contact sports, etc  Refrain from any heavy lifting or straining until you are off narcotics for pain control.   c. DO NOT PUSH THROUGH PAIN.  Let pain be your guide: If it hurts to do something, don't do it.  Pain is your body warning you to avoid that activity for another week until the pain goes down. d. You may drive when you are no longer taking prescription pain medication, you can comfortably wear a seatbelt, and you can safely maneuver your car and apply brakes. e. Dennis Bast may have sexual intercourse when it is comfortable.  9. FOLLOW UP in our office a. Please call [facility] at [phone] to set up an appointment to see your surgeon in the office for a follow-up appointment approximately 1-2 weeks after your surgery. b. Make sure that you call for this appointment the day you arrive home to insure a convenient appointment time. 10. IF YOU HAVE DISABILITY OR FAMILY LEAVE FORMS, BRING THEM TO THE OFFICE FOR PROCESSING.  DO NOT GIVE THEM TO YOUR DOCTOR.   WHEN TO CALL us [phone]: 1. Poor pain control 2. Reactions / problems with new medications (rash/itching, nausea, etc)  3. Fever over 101.5 F (38.5 C) 4. Inability to urinate 5. Nausea and/or vomiting 6. Worsening swelling or bruising 7. Continued bleeding from incision. 8. Increased pain, redness, or drainage from the incision  The clinic staff is available to answer your questions during regular business hours (8:30am-5pm).  Please dont hesitate to call and ask to speak to one of our nurses for clinical concerns.   A surgeon from [facility] is always on call at the hospitals   If you have a medical emergency, go to the nearest emergency room or call 911.    [facility], [address], [place], [place], [place]  [address] ? MAIN: [phone] ? TOLL FREE: [phone] ? FAX [phone] [email or web address]

## 2015-01-22 NOTE — Progress Notes (Signed)
   Subjective:  Patient resting.  Objective:   VITALS:   Filed Vitals:   01/21/15 1500 01/21/15 1700 01/21/15 2100 01/22/15 0452  BP: 130/65 152/93 144/76 127/59  Pulse: 103 107 116 109  Temp:  97.7 F (36.5 C) 97.1 F (36.2 C) 97.6 F (36.4 C)  TempSrc:  Oral Oral Oral  Resp: '18 20 18 18  '$ Height:      Weight:      SpO2: 96% 94% 93% 94%    Intact pulses distally Dorsiflexion/Plantar flexion intact Incision: dressing C/D/I and no drainage No cellulitis present Compartment soft   Lab Results  Component Value Date   WBC 13.8* 01/22/2015   HGB 8.4* 01/22/2015   HCT 24.1* 01/22/2015   MCV 90.6 01/22/2015   PLT 125* 01/22/2015     Assessment/Plan:  3 Days Post-Op   - stable from ortho standpoint to dc to SNF  Marianna Payment 01/22/2015, 10:50 AM [phone]    N. Eduard Roux, MD [place] 10:50 AM

## 2015-01-22 NOTE — Progress Notes (Signed)
ANTIBIOTIC CONSULT NOTE Pharmacy Consult for zosyn Indication: abdominal infection  No Known Allergies  Assessment: 64 yo female continues on Zosyn Day # 4 Scr stable  What is LOT? Streamline?  Goal of Therapy:  Resolution of infection  Plan:  Continue Zosyn 3.375g iv q8h (4h infusion) Continue to follow for LOT    Labs:  Recent Labs  01/20/15 0235 01/21/15 0255 01/22/15 0414  WBC 4.3 10.4 13.8*  HGB 9.4* 8.2* 8.4*  PLT 189 162 125*  CREATININE 1.21* 1.55* 1.63*   Estimated Creatinine Clearance: 32.2 mL/min (by C-G formula based on Cr of 1.63). No results for input(s): VANCOTROUGH, VANCOPEAK, VANCORANDOM, GENTTROUGH, GENTPEAK, GENTRANDOM, TOBRATROUGH, TOBRAPEAK, TOBRARND, AMIKACINPEAK, AMIKACINTROU, AMIKACIN in the last 72 hours.   Microbiology: Recent Results (from the past 720 hour(s))  Urine culture     Status: None   Collection Time: 01/03/15  8:10 PM  Result Value Ref Range Status   Specimen Description URINE, CLEAN CATCH  Final   Special Requests NONE  Final   Culture MULTIPLE SPECIES PRESENT, SUGGEST RECOLLECTION  Final   Report Status 01/05/2015 FINAL  Final  MRSA PCR Screening     Status: None   Collection Time: 01/17/15  7:23 AM  Result Value Ref Range Status   MRSA by PCR NEGATIVE NEGATIVE Final    Comment:        The GeneXpert MRSA Assay (FDA approved for NASAL specimens only), is one component of a comprehensive MRSA colonization surveillance program. It is not intended to diagnose MRSA infection nor to guide or monitor treatment for MRSA infections.   Surgical pcr screen     Status: Abnormal   Collection Time: 01/17/15  7:23 AM  Result Value Ref Range Status   MRSA, PCR NEGATIVE NEGATIVE Final   Staphylococcus aureus POSITIVE (A) NEGATIVE Final    Comment:        The Xpert SA Assay (FDA approved for NASAL specimens in patients over 82 years of age), is one component of a comprehensive surveillance program.  Test performance  has been validated by [facility] for patients greater than or equal to 9 year old. It is not intended to diagnose infection nor to guide or monitor treatment.   Urine culture     Status: None   Collection Time: 01/18/15 10:48 AM  Result Value Ref Range Status   Specimen Description URINE, CATHETERIZED  Final   Special Requests NONE  Final   Culture NO GROWTH 1 DAY  Final   Report Status 01/19/2015 FINAL  Final    Medical History: Past Medical History  Diagnosis Date  . Hypertension     Assessment: 64 yo female continues on Zosyn Day # 4 Scr stable  What is LOT? Streamline?  Goal of Therapy:  Resolution of infection  Plan:  Continue Zosyn 3.375g iv q8h (4h infusion) Continue to follow for LOT  Thank you Anette Guarneri, PharmD [phone]  Tad Moore 01/22/2015,8:52 AM

## 2015-01-22 NOTE — Progress Notes (Signed)
Physical Therapy Treatment Patient Details Name: Kristin Griffin MRN: [id number] DOB: Sep 09, 1950 Today's Date: 01/22/2015    History of Present Illness 64 y.o.-year-old female who sustained a right proximal tibial metaphyseal fracture from a mechanical fall s/p ORIF     PT Comments    Patient requiring +2 assistance with all mobility during PT session. Cues for hand placement and sequence for bed mobility and transfers needed. Patient not able to maintain NWB status on Rt LE, assisting patient as able to minimize weightbearing. Recommending SNF for further rehabilitation.   Follow Up Recommendations  SNF     Equipment Recommendations  Other (comment) (to be addressed at next level of care. )    Recommendations for Other Services       Precautions / Restrictions Precautions Precautions: Fall Required Braces or Orthoses: Other Brace/Splint Other Brace/Splint: Bledsoe locked Rt LE Restrictions Weight Bearing Restrictions: Yes RLE Weight Bearing: Non weight bearing    Mobility  Bed Mobility Overal bed mobility: +2 for physical assistance;Needs Assistance Bed Mobility: Supine to Sit     Supine to sit: Max assist;+2 for physical assistance;HOB elevated     General bed mobility comments: assist with trunk/Rt LE, cues for hand placement.   Transfers Overall transfer level: Needs assistance Equipment used: Rolling walker (2 wheeled) Transfers: Sit to/from Stand Sit to Stand: Total assist;+2 physical assistance Stand pivot transfers: Total assist;+2 physical assistance       General transfer comment: stand pivot to chair, cues for NWB on Rt LE, difficulty getting to fully upright position  Ambulation/Gait                 Stairs            Wheelchair Mobility    Modified Rankin (Stroke Patients Only)       Balance Overall balance assessment: Needs assistance Sitting-balance support: Bilateral upper extremity supported Sitting balance-Leahy Scale:  Poor     Standing balance support: Bilateral upper extremity supported Standing balance-Leahy Scale: Poor                      Cognition Arousal/Alertness: Awake/alert Behavior During Therapy: WFL for tasks assessed/performed Overall Cognitive Status: Within Functional Limits for tasks assessed                      Exercises      General Comments General comments (skin integrity, edema, etc.): family present and observing session. Patient and family deny questions or concerns.       Pertinent Vitals/Pain Pain Assessment: Faces Faces Pain Scale: Hurts little more Pain Location: abdomin Pain Descriptors / Indicators: Guarding;Grimacing Pain Intervention(s): Limited activity within patient's tolerance;Monitored during session;Repositioned    Home Living                      Prior Function            PT Goals (current goals can now be found in the care plan section) Acute Rehab PT Goals Patient Stated Goal: get up to chair PT Goal Formulation: With patient/family Time For Goal Achievement: 02/02/15 Potential to Achieve Goals: Good Progress towards PT goals: Progressing toward goals    Frequency  Min 3X/week    PT Plan Current plan remains appropriate    Co-evaluation PT/OT/SLP Co-Evaluation/Treatment: Yes Reason for Co-Treatment: Complexity of the patient's impairments (multi-system involvement);For patient/therapist safety PT goals addressed during session: Mobility/safety with mobility       End  of Session Equipment Utilized During Treatment: Gait belt;Oxygen;Other (comment) (bledsoe brace) Activity Tolerance: Patient tolerated treatment well Patient left: in chair;with call bell/phone within reach;with family/visitor present     Time: 0761-5183 PT Time Calculation (min) (ACUTE ONLY): 23 min  Charges:  $Therapeutic Activity: 8-22 mins                    G Codes:      Cassell Clement, PT, CSCS Pager [phone] Office [phone]  [phone]  01/22/2015, 3:30 PM

## 2015-01-22 NOTE — Evaluation (Signed)
Occupational Therapy Evaluation Patient Details Name: Kristin Griffin MRN: [id number] DOB: 01-17-1951 Today's Date: 01/22/2015    History of Present Illness 64 y.o.-year-old female who sustained a right proximal tibial metaphyseal fracture from a mechanical fall s/p ORIF    Clinical Impression   Per PT eval, pt required assist with cleaning, cooking, and sponge bathing PTA. Pt currently requiring +2 assistance for all mobility. She requires max assist for ADLs at this time. Pt unable to maintain NWB status on RLE. Recommending d/c to SNF for further rehab to maximize independence and safety. At this time, deferring OT to next venue of care. Thank you for this referral.     Follow Up Recommendations  SNF;Supervision/Assistance - 24 hour    Equipment Recommendations  Other (comment) (defer to next venue of care )    Recommendations for Other Services       Precautions / Restrictions Precautions Precautions: Fall Required Braces or Orthoses: Other Brace/Splint Other Brace/Splint: Bledsoe locked Rt LE Restrictions Weight Bearing Restrictions: Yes RLE Weight Bearing: Non weight bearing      Mobility Bed Mobility Overal bed mobility: +2 for physical assistance;Needs Assistance Bed Mobility: Supine to Sit     Supine to sit: Max assist;+2 for physical assistance;HOB elevated     General bed mobility comments: assist with trunk/Rt LE, cues for hand placement.   Transfers Overall transfer level: Needs assistance Equipment used: Rolling walker (2 wheeled) Transfers: Sit to/from Stand Sit to Stand: Total assist;+2 physical assistance Stand pivot transfers: Total assist;+2 physical assistance       General transfer comment: stand pivot to chair, cues for NWB on Rt LE, difficulty getting to fully upright position    Balance Overall balance assessment: Needs assistance Sitting-balance support: Bilateral upper extremity supported Sitting balance-Leahy Scale: Poor      Standing balance support: Bilateral upper extremity supported Standing balance-Leahy Scale: Poor Standing balance comment: RW for support                            ADL Overall ADL's : Needs assistance/impaired                                       General ADL Comments: Currently pt is max assist for ADLs. Pt with difficulty adhering to NWB on R LE     Vision     Perception     Praxis      Pertinent Vitals/Pain Pain Assessment: Faces Faces Pain Scale: Hurts little more Pain Location: abdomin Pain Descriptors / Indicators: Guarding;Grimacing Pain Intervention(s): Limited activity within patient's tolerance;Monitored during session;Repositioned     Hand Dominance Right   Extremity/Trunk Assessment Upper Extremity Assessment Upper Extremity Assessment: Generalized weakness;RUE deficits/detail RUE Deficits / Details: 3+/5 shoulder flexion   Lower Extremity Assessment Lower Extremity Assessment: Defer to PT evaluation       Communication Communication Communication: No difficulties   Cognition Arousal/Alertness: Awake/alert Behavior During Therapy: WFL for tasks assessed/performed Overall Cognitive Status: Within Functional Limits for tasks assessed                     General Comments       Exercises       Shoulder Instructions      Home Living Family/patient expects to be discharged to:: Skilled nursing facility  Prior Functioning/Environment Level of Independence: Needs assistance (Per PT eval, pt was taking care of ill husband)    ADL's / Homemaking Assistance Needed: assist with cooking and cleaning; sponge bathing Per PT eval.        OT Diagnosis: Generalized weakness;Acute pain   OT Problem List: Decreased strength;Decreased activity tolerance;Impaired balance (sitting and/or standing);Decreased coordination;Decreased safety awareness;Decreased knowledge  of use of DME or AE;Decreased knowledge of precautions;Pain   OT Treatment/Interventions:      OT Goals(Current goals can be found in the care plan section) Acute Rehab OT Goals Patient Stated Goal: get up to chair OT Goal Formulation: With patient  OT Frequency:     Barriers to D/C:            Co-evaluation PT/OT/SLP Co-Evaluation/Treatment: Yes Reason for Co-Treatment: Complexity of the patient's impairments (multi-system involvement);For patient/therapist safety PT goals addressed during session: Mobility/safety with mobility OT goals addressed during session: ADL's and self-care      End of Session Equipment Utilized During Treatment: Gait belt;Rolling walker  Activity Tolerance: Patient tolerated treatment well Patient left: in chair;with call bell/phone within reach;with family/visitor present   Time: 4196-2229 OT Time Calculation (min): 23 min Charges:  OT General Charges $OT Visit: 1 Procedure OT Evaluation $Initial OT Evaluation Tier I: 1 Procedure G-Codes:    Binnie Kand M.S., OTR/L Pager: [phone]  01/22/2015, 3:41 PM

## 2015-01-22 NOTE — Progress Notes (Signed)
Transferred out of ICU overnight.  Family, son, Kristin Griffin and his wife at bedside.  Dr. Verlon Au on rounds in room.  Kashina verbalizes feeling a little better, still very weak.  Her goal is to improve PO intake and PT ability recognizing that this will require SNF.  She would ultimately prefer to be at her own home with hospice services as her disease progresses.  We focussed on short term goals at this time.  HCPOA paperwork given to son, he will discuss with his wife and mother and then complete.    CSW is working on transition to SNF.  Goals of care continue to include DNR, attempt at rehab for quality of life, no aggressive treatment for lung cancer diagnosis, radiation or chemotherapy.  The goals of care discussion should continue during course of SNF and patient condition either improves or declines.  Kizzie Fantasia, RN-BC, MSN, [facility]

## 2015-01-22 NOTE — Progress Notes (Signed)
Patient ID: Kristin Griffin, female   DOB: 05/22/1950, 64 y.o.   MRN: [id number]     [facility]      [address] [place], [place] [address]    Phone: [phone] FAX: [phone]     Subjective: Ostomy full of stool, leaked. Afebrile.   Objective:  Vital signs:  Filed Vitals:   01/21/15 1500 01/21/15 1700 01/21/15 2100 01/22/15 0452  BP: 130/65 152/93 144/76 127/59  Pulse: 103 107 116 109  Temp:  97.7 F (36.5 C) 97.1 F (36.2 C) 97.6 F (36.4 C)  TempSrc:  Oral Oral Oral  Resp: _0 Height:      Weight:      SpO2: 96% 94% 93% 94%    Last BM Date: 01/22/15 (colostomy)  Intake/Output   Yesterday:  09/29 0701 - 09/30 0700 In: 980 [I.V.:930; IV Piggyback:50] Out: 4235 [Urine:1080] This shift: I/O last 3 completed shifts: In: 2830 [I.V.:2730; IV Piggyback:100] Out: 2050 [Urine:1850; Emesis/NG output:200]    Physical Exam: General: Pt awake/alert/oriented x4 in no acute distress Abdomen: Soft.  Nondistended.  Mildly tender at incisions only.  Incision is c/d/i.  llq stoma is pink and viable, large amount of stool.  No evidence of peritonitis.  No incarcerated hernias.    Problem List:   Principal Problem:   Tibia/fibula fracture Active Problems:   Hypertension   Osteoporosis   Right sided weakness   Protein-calorie malnutrition, severe   Brain metastasis   Lung cancer, lower lobe   Hyponatremia   Abdominal pain   Encounter for intubation   Pressure ulcer   DNR (do not resuscitate)    Results:   Labs: Results for orders placed or performed during the hospital encounter of 01/16/15 (from the past 48 hour(s))  Glucose, capillary     Status: Abnormal   Collection Time: 01/20/15 12:28 PM  Result Value Ref Range   Glucose-Capillary 62 (L) 65 - 99 mg/dL   Comment 1 Capillary Specimen    Comment 2 Notify RN   Glucose, capillary     Status: None   Collection Time: 01/20/15  1:44 PM  Result  Value Ref Range   Glucose-Capillary 84 65 - 99 mg/dL   Comment 1 Capillary Specimen   Glucose, capillary     Status: Abnormal   Collection Time: 01/20/15  4:32 PM  Result Value Ref Range   Glucose-Capillary 122 (H) 65 - 99 mg/dL   Comment 1 Capillary Specimen    Comment 2 Notify RN   Glucose, capillary     Status: Abnormal   Collection Time: 01/20/15  9:50 PM  Result Value Ref Range   Glucose-Capillary 151 (H) 65 - 99 mg/dL   Comment 1 Capillary Specimen    Comment 2 Notify RN    Comment 3 Document in Chart   CBC     Status: Abnormal   Collection Time: 01/21/15  2:55 AM  Result Value Ref Range   WBC 10.4 4.0 - 10.5 K/uL   RBC 2.60 (L) 3.87 - 5.11 MIL/uL   Hemoglobin 8.2 (L) 12.0 - 15.0 g/dL   HCT 23.3 (L) 36.0 - 46.0 %   MCV 89.6 78.0 - 100.0 fL   MCH 31.5 26.0 - 34.0 pg   MCHC 35.2 30.0 - 36.0 g/dL   RDW 14.1 11.5 - 15.5 %   Platelets 162 150 - 400 K/uL  Basic metabolic panel     Status: Abnormal  Collection Time: 01/21/15  2:55 AM  Result Value Ref Range   Sodium 138 135 - 145 mmol/L   Potassium 3.7 3.5 - 5.1 mmol/L   Chloride 110 101 - 111 mmol/L   CO2 19 (L) 22 - 32 mmol/L   Glucose, Bld 169 (H) 65 - 99 mg/dL   BUN 35 (H) 6 - 20 mg/dL   Creatinine, Ser 1.55 (H) 0.44 - 1.00 mg/dL   Calcium 7.9 (L) 8.9 - 10.3 mg/dL   GFR calc non Af Amer 34 (L) >60 mL/min   GFR calc Af Amer 39 (L) >60 mL/min    Comment: (NOTE) The eGFR has been calculated using the CKD EPI equation. This calculation has not been validated in all clinical situations. eGFR's persistently <60 mL/min signify possible Chronic Kidney Disease.    Anion gap 9 5 - 15  Glucose, capillary     Status: Abnormal   Collection Time: 01/21/15  8:10 AM  Result Value Ref Range   Glucose-Capillary 155 (H) 65 - 99 mg/dL   Comment 1 Capillary Specimen    Comment 2 Notify RN   Glucose, capillary     Status: Abnormal   Collection Time: 01/21/15 12:04 PM  Result Value Ref Range   Glucose-Capillary 140 (H) 65 - 99  mg/dL   Comment 1 Capillary Specimen    Comment 2 Notify RN   Glucose, capillary     Status: Abnormal   Collection Time: 01/21/15  5:54 PM  Result Value Ref Range   Glucose-Capillary 117 (H) 65 - 99 mg/dL  Glucose, capillary     Status: Abnormal   Collection Time: 01/21/15 10:00 PM  Result Value Ref Range   Glucose-Capillary 108 (H) 65 - 99 mg/dL   Comment 1 Notify RN    Comment 2 Document in Chart   Basic metabolic panel     Status: Abnormal   Collection Time: 01/22/15  4:14 AM  Result Value Ref Range   Sodium 138 135 - 145 mmol/L   Potassium 3.4 (L) 3.5 - 5.1 mmol/L   Chloride 108 101 - 111 mmol/L   CO2 22 22 - 32 mmol/L   Glucose, Bld 114 (H) 65 - 99 mg/dL   BUN 36 (H) 6 - 20 mg/dL   Creatinine, Ser 1.63 (H) 0.44 - 1.00 mg/dL   Calcium 8.0 (L) 8.9 - 10.3 mg/dL   GFR calc non Af Amer 32 (L) >60 mL/min   GFR calc Af Amer 37 (L) >60 mL/min    Comment: (NOTE) The eGFR has been calculated using the CKD EPI equation. This calculation has not been validated in all clinical situations. eGFR's persistently <60 mL/min signify possible Chronic Kidney Disease.    Anion gap 8 5 - 15  CBC     Status: Abnormal   Collection Time: 01/22/15  4:14 AM  Result Value Ref Range   WBC 13.8 (H) 4.0 - 10.5 K/uL   RBC 2.66 (L) 3.87 - 5.11 MIL/uL   Hemoglobin 8.4 (L) 12.0 - 15.0 g/dL   HCT 24.1 (L) 36.0 - 46.0 %   MCV 90.6 78.0 - 100.0 fL   MCH 31.6 26.0 - 34.0 pg   MCHC 34.9 30.0 - 36.0 g/dL   RDW 14.1 11.5 - 15.5 %   Platelets 125 (L) 150 - 400 K/uL  Glucose, capillary     Status: None   Collection Time: 01/22/15  6:15 AM  Result Value Ref Range   Glucose-Capillary 96 65 - 99 mg/dL  Comment 1 Notify RN    Comment 2 Document in Chart     Imaging / Studies: No results found.  Medications / Allergies:  Scheduled Meds: . antiseptic oral rinse  7 mL Mouth Rinse QID  . chlorhexidine gluconate  15 mL Mouth Rinse BID  . Chlorhexidine Gluconate Cloth  6 each Topical Daily  .  dexamethasone  4 mg Intravenous Q12H  . enoxaparin (LOVENOX) injection  40 mg Subcutaneous Q24H  . mupirocin ointment  1 application Nasal BID  . pantoprazole (PROTONIX) IV  40 mg Intravenous Q24H  . piperacillin-tazobactam (ZOSYN)  IV  3.375 g Intravenous Q8H   Continuous Infusions: . dextrose 5 % and 0.9% NaCl 100 mL/hr at 01/21/15 1711   PRN Meds:.acetaminophen **OR** acetaminophen, acetaminophen **OR** acetaminophen, fentaNYL (SUBLIMAZE) injection, menthol-cetylpyridinium **OR** phenol, methocarbamol **OR** methocarbamol (ROBAXIN)  IV, metoCLOPramide **OR** metoCLOPramide (REGLAN) injection, metoprolol, ondansetron **OR** ondansetron (ZOFRAN) IV  Antibiotics: Anti-infectives    Start     Dose/Rate Route Frequency Ordered Stop   01/19/15 1800  piperacillin-tazobactam (ZOSYN) IVPB 3.375 g     3.375 g 12.5 mL/hr over 240 Minutes Intravenous Every 8 hours 01/19/15 1645     01/18/15 2130  ceFAZolin (ANCEF) IVPB 2 g/50 mL premix     2 g 100 mL/hr over 30 Minutes Intravenous Every 6 hours 01/18/15 2054 01/19/15 1106        Assessment/Plan Perforated viscus with peritonitis POD#3 exploratory laparotomy, sigmoid colectomy, end colostomy ---Dr. Brantley Stage -ostomy functioning, give fulls and advance as tolerated -concerned that she is not sleeping at night and would like sleeping aid, will discuss with primary team  -BID wet to dry dressing changes Dispo-per IM/palliative care.  Okay for SNF from our standpoint.  Erby Pian, [facility] Surgery Pager [phone]) For consults and floor pages call [phone]([id number])  01/22/2015 9:59 AM

## 2015-01-22 NOTE — Clinical Social Work Note (Signed)
CSW was notified by SNF that patient insurance requires updated PT/OT evaluations prior to dc.  RN made aware.  Nonnie Done, LCSW [phone]  Psychiatric & Orthopedics (5N 1-8) Clinical Social Worker

## 2015-01-22 NOTE — Progress Notes (Signed)
Kristin Griffin SWF:[id number] DOB: 07-Jan-1951 DOA: 01/16/2015 PCP: Delman Cheadle, MD  Brief narrative: 64 y/o ? Recent admit 9/11-9/20 R sided weakness and tme, found to have brain mass/lung mass [stg IV NSCLadenoCa lung] placed on decadron. Readmit 9/24 s/p fall in parking lot-found to have R prox tibial #-underwent surgery 9/26 Eventually found to have perforated colon s/p surgery and was on vent transiently Because of advanced CA, Adult FTT and other issues Palliatve consulted    Past medical history-As per Problem list Chart reviewed as below-   Consultants:  palliative  Procedures:   none  Antibiotics:  Cefazolin 9/26-9/27  Zosyn 9/27  Augmentin 9/30   Subjective   Fair  managing some diet Pain is manageable but wants some more meds   Objective      Objective: Filed Vitals:   01/21/15 1500 01/21/15 1700 01/21/15 2100 01/22/15 0452  BP: 130/65 152/93 144/76 127/59  Pulse: 103 107 116 109  Temp:  97.7 F (36.5 C) 97.1 F (36.2 C) 97.6 F (36.4 C)  TempSrc:  Oral Oral Oral  Resp: '18 20 18 18  '$ Height:      Weight:      SpO2: 96% 94% 93% 94%    Intake/Output Summary (Last 24 hours) at 01/22/15 1155 Last data filed at 01/22/15 0453  Gross per 24 hour  Intake    400 ml  Output    825 ml  Net   -425 ml    Exam:  General: emaciated, no icterus no pallor S1 S2 no murmur rub or gallop Chest clinically clear no added sound Abdomen soft nontender midline wound is still present, ostomy bag functioning better now No lower extremity to trace edema  Data Reviewed: Basic Metabolic Panel:  Recent Labs Lab 01/19/15 0840 01/19/15 2325 01/20/15 0235 01/21/15 0255 01/22/15 0414  NA 134* 132* 132* 138 138  K 4.8 4.0 4.3 3.7 3.4*  CL 102 101 103 110 108  CO2 24 23 21* 19* 22  GLUCOSE 92 98 95 169* 114*  BUN 25* 30* 32* 35* 36*  CREATININE 0.93 1.27* 1.21* 1.55* 1.63*  CALCIUM 8.2* 7.5* 7.6* 7.9* 8.0*  MG  --   --  1.6*  --   --   PHOS  --   --   4.8*  --   --    Liver Function Tests:  Recent Labs Lab 01/17/15 0447  AST 26  ALT 41  ALKPHOS 52  BILITOT 0.6  PROT 4.9*  ALBUMIN 2.5*   No results for input(s): LIPASE, AMYLASE in the last 168 hours. No results for input(s): AMMONIA in the last 168 hours. CBC:  Recent Labs Lab 01/16/15 1452  01/18/15 0817 01/19/15 0840 01/19/15 2325 01/20/15 0235 01/21/15 0255 01/22/15 0414  WBC 14.9*  < > 13.5* 9.1 3.3* 4.3 10.4 13.8*  NEUTROABS 13.7*  --  12.9*  --   --   --   --   --   HGB 13.0  < > 14.1 12.5 9.4* 9.4* 8.2* 8.4*  HCT 37.5  < > 40.4 35.9* 27.2* 27.0* 23.3* 24.1*  MCV 93.1  < > 91.8 92.1 91.0 90.6 89.6 90.6  PLT 437*  < > 427* 283 209 189 162 125*  < > = values in this interval not displayed. Cardiac Enzymes: No results for input(s): CKTOTAL, CKMB, CKMBINDEX, TROPONINI in the last 168 hours. BNP: Invalid input(s): POCBNP CBG:  Recent Labs Lab 01/21/15 1204 01/21/15 1754 01/21/15 2200 01/22/15 0615 01/22/15 1112  GLUCAP  140* 117* 108* 96 123*    Recent Results (from the past 240 hour(s))  MRSA PCR Screening     Status: None   Collection Time: 01/17/15  7:23 AM  Result Value Ref Range Status   MRSA by PCR NEGATIVE NEGATIVE Final    Comment:        The GeneXpert MRSA Assay (FDA approved for NASAL specimens only), is one component of a comprehensive MRSA colonization surveillance program. It is not intended to diagnose MRSA infection nor to guide or monitor treatment for MRSA infections.   Surgical pcr screen     Status: Abnormal   Collection Time: 01/17/15  7:23 AM  Result Value Ref Range Status   MRSA, PCR NEGATIVE NEGATIVE Final   Staphylococcus aureus POSITIVE (A) NEGATIVE Final    Comment:        The Xpert SA Assay (FDA approved for NASAL specimens in patients over 25 years of age), is one component of a comprehensive surveillance program.  Test performance has been validated by [facility] for patients greater than or equal to 54  year old. It is not intended to diagnose infection nor to guide or monitor treatment.   Urine culture     Status: None   Collection Time: 01/18/15 10:48 AM  Result Value Ref Range Status   Specimen Description URINE, CATHETERIZED  Final   Special Requests NONE  Final   Culture NO GROWTH 1 DAY  Final   Report Status 01/19/2015 FINAL  Final     Studies:              All Imaging reviewed and is as per above notation   Scheduled Meds: . antiseptic oral rinse  7 mL Mouth Rinse QID  . chlorhexidine gluconate  15 mL Mouth Rinse BID  . Chlorhexidine Gluconate Cloth  6 each Topical Daily  . dexamethasone  4 mg Intravenous Q12H  . enoxaparin (LOVENOX) injection  40 mg Subcutaneous Q24H  . mupirocin ointment  1 application Nasal BID  . pantoprazole (PROTONIX) IV  40 mg Intravenous Q24H  . piperacillin-tazobactam (ZOSYN)  IV  3.375 g Intravenous Q8H   Continuous Infusions: . dextrose 5 % and 0.9% NaCl 100 mL/hr at 01/21/15 1711     Assessment/Plan:  Perforated sigmoid colon status post surgery 9/27 -Appreciate general surgery input -Transitioned from IV antibiotics to by mouth Augmentin, stop date would be 01/30/15 -Outpatient follow-up with general surgery as per their instructions -Continue D5 saline for now and taper in am and pain control given Percocet today as now taking by mouth every 4 when necessary 1 tablet and keep fentanyl onboard -  Right tibial metaphyseal fracture status post ORIF 9/26 -Outpatient follow-up with Dr. 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 -Appreciate palliative care input  -Further discussions as an outpatient at skilled facility  Acute kidney injury -Baseline kidney function GFR above 60, currently 37 -Continue D5 saline 100 cc per hour for now -Repeat phosphorus mag and other labs in a.m. as  they were low previously  Sinus tachycardia, hypertension -Change IV metoprolol to home dose of verapamil 240 daily   Appt with PCP: Requested Code Status: DNR Family Communication: family + Disposition Plan: snf in 1-2 days DVT prophylaxis: SCD Consultants:   Verneita Griffes, MD  [facility] Pager [phone] 01/22/2015, 11:55 AM    LOS:  6 days

## 2015-01-23 DIAGNOSIS — R100 Acute abdomen: Secondary | ICD-10-CM

## 2015-01-23 DIAGNOSIS — Z515 Encounter for palliative care: Secondary | ICD-10-CM

## 2015-01-23 DIAGNOSIS — S82201A Unspecified fracture of shaft of right tibia, initial encounter for closed fracture: Secondary | ICD-10-CM | POA: Insufficient documentation

## 2015-01-23 DIAGNOSIS — M810 Age-related osteoporosis without current pathological fracture: Secondary | ICD-10-CM

## 2015-01-23 DIAGNOSIS — R109 Unspecified abdominal pain: Secondary | ICD-10-CM | POA: Insufficient documentation

## 2015-01-23 DIAGNOSIS — S82291A Other fracture of shaft of right tibia, initial encounter for closed fracture: Secondary | ICD-10-CM

## 2015-01-23 LAB — MAGNESIUM: Magnesium: 1.6 mg/dL — ABNORMAL LOW (ref 1.7–2.4)

## 2015-01-23 LAB — CBC
HCT: 23.1 % — ABNORMAL LOW (ref 36.0–46.0)
HEMOGLOBIN: 8 g/dL — AB (ref 12.0–15.0)
MCH: 31.5 pg (ref 26.0–34.0)
MCHC: 34.6 g/dL (ref 30.0–36.0)
MCV: 90.9 fL (ref 78.0–100.0)
PLATELETS: 60 10*3/uL — AB (ref 150–400)
RBC: 2.54 MIL/uL — AB (ref 3.87–5.11)
RDW: 14 % (ref 11.5–15.5)
WBC: 11.4 10*3/uL — ABNORMAL HIGH (ref 4.0–10.5)

## 2015-01-23 LAB — COMPREHENSIVE METABOLIC PANEL
ALBUMIN: 1.5 g/dL — AB (ref 3.5–5.0)
ALK PHOS: 60 U/L (ref 38–126)
ANION GAP: 8 (ref 5–15)
AST: 27 U/L (ref 15–41)
BUN: 36 mg/dL — ABNORMAL HIGH (ref 6–20)
CALCIUM: 7.9 mg/dL — AB (ref 8.9–10.3)
CHLORIDE: 112 mmol/L — AB (ref 101–111)
CO2: 19 mmol/L — AB (ref 22–32)
CREATININE: 1.2 mg/dL — AB (ref 0.44–1.00)
GFR calc Af Amer: 54 mL/min — ABNORMAL LOW (ref >60)
GFR calc non Af Amer: 46 mL/min — ABNORMAL LOW (ref >60)
GLUCOSE: 193 mg/dL — AB (ref 65–99)
Potassium: 3.2 mmol/L — ABNORMAL LOW (ref 3.5–5.1)
SODIUM: 139 mmol/L (ref 135–145)
Total Bilirubin: 0.6 mg/dL (ref 0.3–1.2)
Total Protein: 4.1 g/dL — ABNORMAL LOW (ref 6.5–8.1)

## 2015-01-23 LAB — PHOSPHORUS: Phosphorus: 2.6 mg/dL (ref 2.5–4.6)

## 2015-01-23 LAB — GLUCOSE, CAPILLARY
GLUCOSE-CAPILLARY: 205 mg/dL — AB (ref 65–99)
GLUCOSE-CAPILLARY: 151 mg/dL — AB (ref 65–99)
GLUCOSE-CAPILLARY: 135 mg/dL — AB (ref 65–99)
Glucose-Capillary: 143 mg/dL — ABNORMAL HIGH (ref 65–99)

## 2015-01-23 MED ORDER — ZOLPIDEM TARTRATE 5 MG PO TABS: 5.0000 mg | ORAL_TABLET | Freq: Every day | ORAL | Status: AC

## 2015-01-23 MED ORDER — DEXAMETHASONE 4 MG PO TABS
4.0000 mg | ORAL_TABLET | Freq: Two times a day (BID) | ORAL | Status: DC
  Administered 2015-01-23 – 2015-01-24 (×2): 4 mg via ORAL
  Filled 2015-01-23 (×2): qty 1

## 2015-01-23 MED ORDER — OXYCODONE HCL 5 MG PO TABS: 5.0000 mg | ORAL_TABLET | Freq: Four times a day (QID) | ORAL | Status: DC | PRN

## 2015-01-23 MED ORDER — AMOXICILLIN-POT CLAVULANATE 875-125 MG PO TABS: 1.0000 | ORAL_TABLET | Freq: Two times a day (BID) | ORAL | Status: DC

## 2015-01-23 MED ORDER — SILVER NITRATE-POT NITRATE 75-25 % EX MISC
1.0000 "application " | Freq: Once | CUTANEOUS | Status: DC
  Filled 2015-01-23 (×2): qty 1

## 2015-01-23 MED ORDER — ENOXAPARIN SODIUM 40 MG/0.4ML ~~LOC~~ SOLN: 40.0000 mg | SUBCUTANEOUS | Status: AC

## 2015-01-23 MED ORDER — MORPHINE SULFATE (CONCENTRATE) 10 MG/0.5ML PO SOLN
5.0000 mg | ORAL | Status: DC | PRN
  Administered 2015-01-23 – 2015-01-24 (×3): 10 mg via ORAL
  Filled 2015-01-23 (×3): qty 0.5

## 2015-01-23 MED ORDER — PANTOPRAZOLE SODIUM 40 MG PO TBEC
40.0000 mg | DELAYED_RELEASE_TABLET | Freq: Every day | ORAL | Status: DC
  Administered 2015-01-23 – 2015-01-24 (×2): 40 mg via ORAL
  Filled 2015-01-23 (×2): qty 1

## 2015-01-23 NOTE — Progress Notes (Signed)
Bleeding noted on abdominal dressing, the bleeding is still contained in the dressing at this time and not yet on the surface. Surgeon was paged and gave orders to monitor closely and reinforce the dressing. Dressing was reinforced with abdominal pads and tape. Will continue to monitor.  Lowell Guitar

## 2015-01-23 NOTE — Progress Notes (Signed)
Patient had bleeding, likely from the subcutaneous tissue in the mid-portion of the incision.  Seems like it would respond well to silver nitrate if it should occur again.  Kathryne Eriksson. Dahlia Bailiff, MD, [place] [phone] [facility] Surgery

## 2015-01-23 NOTE — Discharge Summary (Addendum)
Physician  Discharge Summary  Kristin Griffin HKV:[id number] DOB: 1950/05/24 DOA: 01/16/2015  PCP: Delman Cheadle, MD  Admit date: 01/16/2015 Discharge date: 01/23/2015  Time spent: 25 minutes  Recommendations for Outpatient Follow-up:  1. Recommend CBC and complete metabolic panel in 1 week 2. Please have patient follow-up with Dr. Earlie Server on 01/28/15 at [facility] in about 1-2 weeks for discussion centered around goals of care and other decision making if patient wishes.  If electing on Hopsice, please cancel appt. 3. Patient will need follow-up with Dr. Eduard Roux of orthopedics to be arranged as an outpatient in about 2 weeks and is to be nonweightbearing and placed on Lovenox for prevention of DVT 4. the patient will also need follow-up with general surgery Dr. Hulen Skains in about 3-6 weeks  Discharge Diagnoses:  Principal Problem:   Tibia/fibula fracture Active Problems:   Hypertension   Osteoporosis   Right sided weakness   Protein-calorie malnutrition, severe   Brain metastasis   Lung cancer, lower lobe   Hyponatremia   Abdominal pain   Encounter for intubation   Pressure ulcer   DNR (do not resuscitate)   Discharge Condition: good  Diet recommendation: heart healthy  Filed Weights   01/16/15 1343 01/20/15 0546  Weight: 52.617 kg (116 lb) 60.9 kg (134 lb 4.2 oz)    History of present illness:   64 y/o ? Recent admit 9/11-9/20 R sided weakness and tme, found to have brain mass/lung mass [stg IV NSCLadenoCa lung] placed on decadron. Readmit 9/24 s/p fall in parking lot-found to have R prox tibial #-underwent surgery 9/26 Eventually found to have perforated colon s/p surgery and was on vent transiently Because of advanced CA, Adult FTT and other issues Palliatve consulted   I had  Along discussion with son as did Palliative care-they are not interested in anything but going home and maybe Hospice at home if possible  Hospital Course:  Perforated sigmoid colon status  post surgery 9/27 -Appreciate general surgery input -Transitioned from IV antibiotics to by mouth Augmentin, stop date would be 01/30/15 -Outpatient follow-up with general surgery as per their instructions -pain control with dilaudid -patient seems to have given up and doesn't wish further therpay and interested in a Hopsice based approach.  We will support this and Hospice has been requested to follow the patient at home  Right tibial metaphyseal fracture status post ORIF 9/26 -Outpatient follow-up with Dr. 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  Son beddie Cardiovascular: s1 s 2no m/r/g Respiratory: clear  Discharge Instructions    Current Discharge Medication List    START taking these medications   Details  enoxaparin (LOVENOX) 40 MG/0.4ML injection Inject 0.4 mLs (40 mg total) into the skin daily. Qty: 30 Syringe, Refills: 0    HYDROmorphone (DILAUDID) 4 MG tablet Take 1 tablet (4 mg total) by mouth every 2 (two) hours as needed for severe pain. Qty: 30 tablet,  Refills: 0    silver nitrate applicators 25-63 % applicator Apply 1 Stick (1 application total) topically once. Qty: 100 each, Refills: 0    zolpidem (AMBIEN) 5 MG tablet Take 1 tablet (5 mg total) by mouth at bedtime. Qty: 30 tablet, Refills: 0      CONTINUE these medications which have CHANGED   Details  dexamethasone  (DECADRON) 4 MG tablet Take 1 tablet (4 mg total) by mouth every 12 (twelve) hours. Qty: 40 tablet, Refills: 0      CONTINUE these medications which have NOT CHANGED   Details  bisacodyl (DULCOLAX) 10 MG suppository Place 1 suppository (10 mg total) rectally daily. Qty: 12 suppository, Refills: 0    docusate sodium (COLACE) 100 MG capsule Take 1 capsule (100 mg total) by mouth 2 (two) times daily. Qty: 10 capsule, Refills: 0    feeding supplement, ENSURE ENLIVE, (ENSURE ENLIVE) LIQD Take 237 mLs by mouth 2 (two) times daily between meals. Qty: 237 mL, Refills: 12    ondansetron (ZOFRAN) 4 MG tablet Take 1 tablet (4 mg total) by mouth every 6 (six) hours as needed for nausea. Qty: 20 tablet, Refills: 0    pantoprazole (PROTONIX) 40 MG tablet Take 1 tablet (40 mg total) by mouth daily.    polyethylene glycol (MIRALAX / GLYCOLAX) packet Take 17 g by mouth 2 (two) times daily. Qty: 14 each, Refills: 0      STOP taking these medications     alendronate (FOSAMAX) 70 MG tablet      calcium carbonate (OS-CAL) 600 MG TABS tablet      insulin aspart (NOVOLOG) 100 UNIT/ML injection      lisinopril-hydrochlorothiazide (PRINZIDE,ZESTORETIC) 20-25 MG per tablet      Multiple Vitamin (MULTIVITAMIN WITH MINERALS) TABS tablet      Omega-3 Fatty Acids (FISH OIL) 1000 MG CAPS      traMADol (ULTRAM) 50 MG tablet      verapamil (CALAN-SR) 240 MG CR tablet      oxyCODONE (OXY IR/ROXICODONE) 5 MG immediate release tablet      insulin aspart (NOVOLOG) 100 UNIT/ML injection        No Known Allergies Follow-up Information    Follow up with Marianna Payment, MD In 2 weeks.   Specialty:  Orthopedic Surgery   Why:  For suture removal, For wound re-check   Contact information:   [place] [address] [phone]       Follow up with Erroll Luna A., MD On 01/22/2015.   Specialty:  General Surgery   Contact information:   [address] [place] [address] [phone]        The results of significant diagnostics from this hospitalization (including imaging, microbiology, ancillary and laboratory) are listed below for reference.    Significant Diagnostic Studies: Ct Abdomen Pelvis Wo Contrast  01/19/2015   CLINICAL DATA:  Abdominal pain for 3 weeks  EXAM: CT ABDOMEN AND PELVIS WITHOUT CONTRAST  TECHNIQUE: Multidetector CT imaging of the abdomen and pelvis was performed following the standard protocol without IV contrast.  COMPARISON:  01/04/2015  FINDINGS: Lower chest: New small to moderate right pleural effusion. 3 cm area of infiltrate in the peripheral aspect of the inferior right middle lobe similar to prior study. Abnormal soft tissue fullness right hilum may represent mucous plugging or adenopathy. It is not evaluated well without contrast and is only partially visualized. There is trace left pleural effusion.  Hepatobiliary: Small gallstones again identified  Pancreas: Normal  Spleen: Normal  Adrenals/Urinary Tract: No acute findings. Known low-attenuation lesion left kidney as described on prior CT scan not seen on today's non contrasted study.  Stomach/Bowel: The colon is relatively decompressed. There is significant sigmoid colon diverticulosis. The majority of oral contrast is within the stomach, which is distended, as well as proximal small bowel, which is also distended. Mid to distal small bowel appears decompressed.Caliber transition is in the left upper quadrant, in proximal jejunum, seen best on axial image 56, and the involved portion of bowel shows evidence of wall thickening without further detail. Very little oral contrast passes beyond this point.  Vascular/Lymphatic: Extensive atherosclerotic aortoiliac calcification  Reproductive: Not identified  Other: There is pneumoperitoneum. There is a small volume of ascites throughout the abdomen and pelvis. There is edematous change in the mesentery as well as in the  subcutaneous soft tissues.  Musculoskeletal: Again identified are numerous small lucencies throughout the lumbar and sacral spine which could represent osteopenia or metastases or multiple myeloma as previously described.  IMPRESSION: 1. Pneumoperitoneum implying the presence of surgical abdomen, perforation is suspected. There is a high-grade proximal small bowel obstruction as described above. 2. Small volume of ascites. Edematous change in the subcutaneous soft tissues and mesentery. Right pleural effusion. 3. Irregular opacity right lung base again identified. Abnormal right hilar soft tissue. 4. Lucent lesions in the spine as previously described. These could reflect osteoporosis versus myeloma or metastasis. Critical Value/emergent results were called by telephone at the time of interpretation on 01/19/2015 at 4:44 pm to Dr. Debbe Odea , who verbally acknowledged these results.   Electronically Signed   By: Skipper Cliche M.D.   On: 01/19/2015 16:45   Dg Chest 2 View  01/03/2015   CLINICAL DATA:  Initial evaluation for acute right-sided weakness.  EXAM: CHEST  2 VIEW  COMPARISON:  None.  FINDINGS: Transverse heart size at the upper limits of normal. Mediastinal silhouette within normal limits. Atheromatous plaque present within the aortic arch.  Lungs are mildly hyperinflated with attenuation of the pulmonary markings, consistent with emphysema. Minimal left basilar atelectasis/scarring. Similarly, probable parenchymal scarring within the peripheral right lower lobe. No focal infiltrate, pulmonary edema, or pleural effusion. No pneumothorax. There is a 15 mm nodular and somewhat lobulated density overlying the right upper lobe, indeterminate.  Osteopenia noted.  No acute osseus abnormality.  IMPRESSION: 1. Emphysema.  No superimposed active cardiopulmonary disease. 2. 14 mm nodular density within the right upper lobe. Follow-up examination with cross-sectional imaging of the chest is recommended for  further evaluation.   Electronically Signed   By: Jeannine Boga M.D.   On: 01/03/2015 21:39   Dg Tibia/fibula Right  01/18/2015   CLINICAL DATA:  Tibial fracture, ORIF  EXAM: DG C-ARM 61-120 MIN; RIGHT TIBIA AND FIBULA - 2 VIEW  COMPARISON:  CT RIGHT knee 01/16/2015  FLUOROSCOPY TIME:  2 minutes 35 seconds  Number of images:  4  FINDINGS: Four digital C-arm fluoroscopic images obtained intraoperatively demonstrate placement of a lateral plate and multiple screws across to a comminuted oblique fracture of the proximal RIGHT tibial metadiaphysis.  Knee joint alignment normal.  Minimally displaced RIGHT fibular neck fracture.  No additional fracture, dislocation or bone destruction.  Bones diffusely demineralized.  IMPRESSION: Post ORIF of the proximal RIGHT tibia.   Electronically Signed   By: Lavonia Dana M.D.   On: 01/18/2015 17:27   Dg Tibia/fibula Right  01/16/2015   CLINICAL DATA:  Dietitian into  right leg.  EXAM: RIGHT TIBIA AND FIBULA - 2 VIEW  COMPARISON:  None.  FINDINGS: Comminuted and displaced fractures involving the proximal tibia and fibula. There is mild lateral displacement of the tibial fracture. Tibial fracture does not clearly involve the tibial plateau and there is no evidence for a knee joint effusion. The knee is located. The distal tibia and fibula are intact. No gross abnormality to the ankle.  IMPRESSION: Comminuted and displaced fractures of the proximal tibia and fibula.   Electronically Signed   By: Markus Daft M.D.   On: 01/16/2015 14:50   Ct Head Wo Contrast  01/03/2015   CLINICAL DATA:  Right-sided hemiparesis.  EXAM: CT HEAD WITHOUT CONTRAST  TECHNIQUE: Contiguous axial images were obtained from the base of the skull through the vertex without intravenous contrast.  COMPARISON:  None.  FINDINGS: 2.5 x 2.1 cm mass is noted in the left centrum semiovale with surrounding white matter edema. Also noted is 2.3 x 1.4 cm mass in right cerebellar hemisphere with surrounding  white matter edema. These findings most consistent with metastatic disease. Ventricular size is within normal limits. No significant midline shift is seen at this time. No definite hemorrhage is noted. Multiple small lucencies are noted in the bony calvarium which potentially may represent metastatic disease.  IMPRESSION: Masses with surrounding white matter edema are noted in the left parietal lobe and right cerebellar hemisphere most consistent with metastatic disease. Also noted are multiple small lucencies in the bony calvarium which may represent metastatic disease. MRI with and without gadolinium is recommended for further evaluation.   Electronically Signed   By: Marijo Conception, M.D.   On: 01/03/2015 19:40   Ct Chest W Contrast  01/04/2015   ADDENDUM REPORT: 01/04/2015 10:31  ADDENDUM: There is atherosclerotic change in aorta but no aneurysm. No thoracic aortic aneurysm or dissection. No pulmonary embolus appreciable. There are scattered foci of coronary artery calcification.   Electronically Signed   By: Lowella Grip III M.D.   On: 01/04/2015 10:31   01/04/2015   CLINICAL DATA:  Pulmonary nodular lesion on chest radiograph  EXAM: CT CHEST WITH CONTRAST  TECHNIQUE: Multidetector CT imaging of the chest was performed during intravenous contrast administration.  CONTRAST:  9m OMNIPAQUE IOHEXOL 300 MG/ML  SOLN  COMPARISON:  Chest radiograph January 03, 2015  FINDINGS: There is underlying centrilobular emphysematous change. There is an irregular opacity with mild adjacent peribronchial thickening in the right apex measuring 1.4 x 0.6 cm, best seen on axial slice 9 series 2414 There is a lobular appearing nodular lesion in the posterior segment of the right upper lobe measuring 1.7 x 1.3 cm, best seen on axial slice 20 series 2239 There is a lobular appearing nodule also in the posterior segment of the right upper lobe measuring 1.2 x 1.2 cm, best seen on axial slice 26 series 2532 There is a nodular  lesion abutting the minor fissure in the posterior segment left upper lobe on slice 30 series 2023measuring 0.9 x 0.9 cm. There is a nodular lesion in the superior segment of the right lower lobe seen on axial slice 36 series 2343measuring 1.2 x 1.2 cm. There is an irregular nodular lesion in the superior segment of the right lower lobe measuring 1.2 x 1.1 cm on axial slice 38 series 2568 There is an irregular spiculated lesion in the lateral segment of the right middle lobe measuring 2.8 x 2.3 cm. This lesion is best seen on axial  slice 40 series 599. There is an area of consolidation in the posterior segment of the right lower lobe. There are scattered areas of presumed scarring in the lungs bilaterally.  There is a nodular lesion in the right lobe of the thyroid measuring 1.7 x 0.7 cm. There is a nodular lesion arising from the isthmus of the thyroid extending toward the left measuring 2.5 x 1.0 cm.  There are prominent right hilar lymph nodes. The largest individual lymph node measures 1.7 x 1.1 cm. A second right hilar lymph node measures 1.3 x 1.2 cm. No other adenopathy is appreciable.  There is left ventricular hypertrophy. There is calcification in multiple coronary artery regions. Pericardium is not thickened.  Visualized upper abdomen, the adrenals appear unremarkable. There is atherosclerotic change in aorta. There is equivocal hepatic steatosis.  There are no blastic or lytic bone lesions. There is degenerative change in the thoracic spine with areas of endplate concavity at several levels, likely due to underlying osteoporosis.  IMPRESSION: Underlying emphysema. Multiple pulmonary nodular lesions, largest in the right middle lobe measuring 2.8 x 2.3 cm. Multifocal neoplasm is of concern. Advise PET-CT to further evaluate.  Consolidation right base posteriorly. This appearance by CT is more consistent with pneumonia than neoplasm, although a followup study in approximately 4 weeks to further evaluate  this area may well be warranted.  Right hilar adenopathy.  Thyroid nodular lesions as noted above. Advise thyroid ultrasound to further evaluate.  These results will be called to the ordering clinician or representative by the Radiologist Assistant, and communication documented in the PACS or zVision Dashboard.  Electronically Signed: By: Lowella Grip III M.D. On: 01/04/2015 10:26   Ct Knee Right Wo Contrast  01/16/2015   CLINICAL DATA:  Tib-fib fracture. Hit in the leg with slamming car door  EXAM: CT OF THE RIGHT KNEE WITHOUT CONTRAST  TECHNIQUE: Multidetector CT imaging of the right knee was performed according to the standard protocol. Multiplanar CT image reconstructions were also generated.  COMPARISON:  Plain films earlier today  FINDINGS: Comminuted fracture noted within the proximal tibial metaphysis with multiple fracture fragments. Fracture fragments are mildly displaced. No visible intra-articular extension. Mildly impacted femoral neck fracture. No femoral abnormality. No joint effusion. Vascular calcifications posterior to the left knee in the popliteal artery.  IMPRESSION: Comminuted, displaced proximal right tibial metaphyseal fracture. No intra-articular extension visualized.  Mildly impacted fibular neck fracture.   Electronically Signed   By: Rolm Baptise M.D.   On: 01/16/2015 17:05   Mr Jeri Cos JT Contrast  01/05/2015   CLINICAL DATA:  Right-sided hemi paresis. Unintentional weight loss. Brain masses on CT. Evidence of metastatic disease.  EXAM: MRI HEAD WITHOUT AND WITH CONTRAST  TECHNIQUE: Multiplanar, multiecho pulse sequences of the brain and surrounding structures were obtained without and with intravenous contrast.  CONTRAST:  5m MULTIHANCE GADOBENATE DIMEGLUMINE 529 MG/ML IV SOLN  COMPARISON:  Head CT 01/03/2015  FINDINGS: There is no evidence of acute infarct, midline shift, or extra-axial fluid collection. There is mild to moderate generalized cerebral atrophy. Patchy  periventricular and subcortical white matter T2 hyperintensities are nonspecific but compatible with mild-to-moderate chronic small vessel ischemic disease.  Brain masses described on the recent CT demonstrate solid enhancement and measure 3.4 x 2.5 cm in the inferior right cerebellar hemisphere and 2.8 x 2.1 cm in the posterior left frontal lobe, each with mild surrounding vasogenic edema. Both lesions demonstrate areas of susceptibility artifact consistent with chronic blood products. A separate focus  of chronic microhemorrhage is noted slightly more anteriorly in the right cerebellum without associated enhancement. There is a prominent vessel which courses into the superior aspect of the left frontal lesion. No other enhancing brain lesions are identified.  The bone marrow signal of the skull is diffusely heterogeneous on precontrast T1 weighted images and demonstrates diffusely heterogeneous enhancement after contrast administration. No destructive skull lesion is identified, however there are scattered subcentimeter foci of discrete enhancement. No dural-based mass is seen, although the right cerebellar mass does extend towards the dural surface.  Orbits are unremarkable. Paranasal sinuses and mastoid air cells are clear. Major intracranial vascular flow voids are preserved.  IMPRESSION: 1. Two enhancing brain masses, consistent with metastases. Mild surrounding edema without significant mass effect. 2. Diffusely heterogeneous bone marrow in the skull, nonspecific however metastatic disease is a consideration.   Electronically Signed   By: Logan Bores M.D.   On: 01/05/2015 13:37   Nm Bone Scan Whole Body  01/05/2015   CLINICAL DATA:  Brain metastases, as well as lucent lesions in the calvarium concerning for bone mets  EXAM: NUCLEAR MEDICINE WHOLE BODY BONE SCAN  TECHNIQUE: Whole body anterior and posterior images were obtained approximately 3 hours after intravenous injection of radiopharmaceutical.   RADIOPHARMACEUTICALS:  25 mCi Technetium-13mMDP IV  COMPARISON:  Multiple studies performed september 2016.  FINDINGS: Multiple foci of increased uptake involving the bilateral ribs. These appear to correspond to numerous rib fractures visible on the 01/04/15 CT scan. Lesion left femoral neck and trochanters consistent with fracture seen on same CT scan. Focus of uptake distal left forearm. Minimally heterogenous uptake in the calvarium without definite focal metastatic lesion there.  Degenerative uptake in the knees, right worse than left. Degenerative uptake in the ankles and probably degenerative uptake in the bilateral hindfeet.Degenerative uptake bilateral AC joints.  IMPRESSION: Lesions bilateral ribs and left femur consistent with fractures visible on recent CT scan.  Uptake distal left forearm:  Forearm radiographs recommended.  Minimally heterogenous uptake in the calvarium without definitive metastasis in this area.   Electronically Signed   By: RSkipper ClicheM.D.   On: 01/05/2015 17:00   Ct Abdomen Pelvis W Contrast  01/05/2015   CLINICAL DATA:  64year old female with concern for malignancy  EXAM: CT ABDOMEN AND PELVIS WITH CONTRAST  TECHNIQUE: Multidetector CT imaging of the abdomen and pelvis was performed using the standard protocol following bolus administration of intravenous contrast.  CONTRAST:  835mOMNIPAQUE IOHEXOL 300 MG/ML  SOLN  COMPARISON:  Chest CT dated 01/04/2015  FINDINGS: Evaluation is limited due to respiratory motion artifact.  The 4.6 x 2.8 cm pleural based masslike opacity is noted at the right lung base posteriorly.  No intra-abdominal free air or free fluid identified.  The liver appears unremarkable. There are multiple stones within the gallbladder. No pericholecystic fluid or evidence of gallbladder inflammation. The pancreas appears unremarkable. There is slight prominence of the head of the pancreas with extension into the duodenal C-loop. No discrete lesion identified.  There is no atrophy of the gland or duct dilatation. The spleen appears unremarkable. A splenule is noted. There is apparent thickening of the left adrenal gland on the coronal view 58 be related to an underlying is normal. The right adrenal gland is unremarkable.  There is lobulated appearance of the renal cortices bilaterally. There is a 2.4 x 2.1 cm hypoenhancing lesion in the superior pole of the left kidney. Ultrasound is recommended for further characterization. Subcentimeter scattered bilateral renal  hypodense lesions are too small to characterize. There is no hydronephrosis on either side. The visualized ureters and urinary bladder appear unremarkable. Hysterectomy.  There is sigmoid diverticulosis with muscular hypertrophy. No active inflammation. Moderate stool noted throughout the colon. There no evidence of bowel obstruction or inflammation.  Advanced aortoiliac atherosclerotic disease. The origins of the celiac axis, SMA, IMA as well as the origins of the renal arteries are patent. No portal venous gas identified. There is no lymphadenopathy.  Osteopenia with degenerative changes of the spine. There is compression deformity of the superior endplate of the H84 vertebra, age indeterminate, likely chronic. Clinical correlation is recommended. There is fracture of the greater trochanter of the left femur fossa on the prior CT dated 10/23/2014. Old left posterior rib fractures noted. No new fracture identified. Small scattered lucencies throughout the lumbar spine may be related to osteopenia or malignancy such as multiple myeloma or metastatic disease.  IMPRESSION: Partially visualized right lung base subpleural consolidation/mass as seen on the prior chest CT.  Left renal upper pole hypodense lesion. Ultrasound is recommended for further initial evaluation. MRI may be related for additional characterization depending on the ultrasound findings.  Cholelithiasis.  Sigmoid diverticulosis. No evidence of bowel  obstruction or inflammation.  Osteopenia with fracture of the greater trochanter of the left femur as well as old left posterior rib fractures. Small lucencies throughout the lumbar vertebra may be related to osteopenia or represent multiple myeloma/ metastatic disease. Bone scan may provide better evaluation if clinically indicated.   Electronically Signed   By: Anner Crete M.D.   On: 01/05/2015 03:46   US Renal  01/05/2015   CLINICAL DATA:  Hypertension.  Indeterminate left renal mass on CT.  EXAM: RENAL / URINARY TRACT ULTRASOUND COMPLETE  COMPARISON:  CT of 01/04/2015.  FINDINGS: Right Kidney:  Length: 9.7 cm. No hydronephrosis. Normal renal cortical thickness and echogenicity.  Left Kidney:  Length: 9.8 cm. No hydronephrosis. Normal renal cortical thickness and echogenicity. Tiny left renal cysts identified. The upper pole dominant left renal lesion described on prior CT is not readily identified or well evaluated.  Bladder:  Appears normal for degree of bladder distention.  IMPRESSION: 1. The upper pole left renal indeterminate lesion on CT is not well evaluated or visualized. Recommend nonemergent outpatient pre and post contrast abdominal MRI. 2.  No acute findings.   Electronically Signed   By: Abigail Miyamoto M.D.   On: 01/05/2015 20:42   Portable Chest Xray  01/20/2015   CLINICAL DATA:  Status post sigmoid colectomy for colon perforation 01/16/2015. History of metastatic lung carcinoma.  EXAM: PORTABLE CHEST 1 VIEW  COMPARISON:  Single view of the chest 01/19/2015. CT chest 01/04/2015.  FINDINGS: Endotracheal tube and NG tube remain in place. Bilateral pulmonary nodules are again seen. Layering right pleural effusion and basilar atelectasis are again identified. Very small left pleural effusion is noted. The appearance of the chest is unchanged.  IMPRESSION: Support apparatus projects in good position.  No change in right greater than left pleural effusions and basilar atelectasis.  Bilateral  pulmonary nodules consistent with metastatic disease.   Electronically Signed   By: Inge Rise M.D.   On: 01/20/2015 07:40   Dg Chest Port 1 View  01/20/2015   CLINICAL DATA:  Encounter for intubation  EXAM: PORTABLE CHEST 1 VIEW  COMPARISON:  01/08/2015  FINDINGS: Endotracheal tube with tip just below the clavicular heads. An orogastric tube reaches the stomach.  Hazy and streaky opacity of the  right chest consistent with layering pleural fluid and atelectasis seen on abdominal CT from earlier the same day. Trace left pleural effusion.  Patchy indistinct nodular airspace opacities, recently evaluated by chest CT 01/04/2015.  Normal heart size and aortic contours.  IMPRESSION: 1. Endotracheal and orogastric tubes in good position. 2. Layering right pleural effusion with atelectasis. 3. Bilateral pulmonary nodules, reference chest CT 01/04/2015   Electronically Signed   By: Monte Fantasia M.D.   On: 01/20/2015 00:08   Dg Chest Port 1 View  01/08/2015   CLINICAL DATA:  Status post bronchoscopy with biopsy.  EXAM: PORTABLE CHEST - 1 VIEW  COMPARISON:  January 03, 2015.  FINDINGS: The heart size and mediastinal contours are within normal limits. No pneumothorax or significant pleural effusion is noted. Stable nodular density is noted in right upper lobe concerning for possible neoplasm. Old right rib fracture is noted. Left lung is clear.  IMPRESSION: No pneumothorax or significant pleural effusion is noted. Stable nodular density seen in right upper lobe concerning for possible neoplasm.   Electronically Signed   By: Marijo Conception, M.D.   On: 01/08/2015 11:38   Dg Knee Complete 4 Views Right  01/16/2015   CLINICAL DATA:  Right knee and proximal tib-fib pain, bruising. Leg slammed by car door.  EXAM: RIGHT KNEE - COMPLETE 4+ VIEW  COMPARISON:  None.  FINDINGS: There is a comminuted fracture through the proximal right tibial metaphysis. Fracture fragments are moderately displaced. Note definite  extension into the knee joint. No joint effusion. There is also a fracture through the right fibular neck.  IMPRESSION: Comminuted proximal right tibial metaphyseal fracture with mild displacement.  Right fibular neck fracture.   Electronically Signed   By: Rolm Baptise M.D.   On: 01/16/2015 14:49   Dg Abd Portable 1v  01/18/2015   CLINICAL DATA:  Onset of mid abdominal pain last night  EXAM: PORTABLE ABDOMEN - 1 VIEW  COMPARISON:  01/05/2015  FINDINGS: The bowel gas pattern is normal. No radio-opaque calculi or other significant radiographic abnormality are seen.  IMPRESSION: Nonobstructive bowel gas pattern.   Electronically Signed   By: Kerby Moors M.D.   On: 01/18/2015 11:15   Dg C-arm 61-120 Min  01/18/2015   CLINICAL DATA:  Tibial fracture, ORIF  EXAM: DG C-ARM 61-120 MIN; RIGHT TIBIA AND FIBULA - 2 VIEW  COMPARISON:  CT RIGHT knee 01/16/2015  FLUOROSCOPY TIME:  2 minutes 35 seconds  Number of images:  4  FINDINGS: Four digital C-arm fluoroscopic images obtained intraoperatively demonstrate placement of a lateral plate and multiple screws across to a comminuted oblique fracture of the proximal RIGHT tibial metadiaphysis.  Knee joint alignment normal.  Minimally displaced RIGHT fibular neck fracture.  No additional fracture, dislocation or bone destruction.  Bones diffusely demineralized.  IMPRESSION: Post ORIF of the proximal RIGHT tibia.   Electronically Signed   By: Lavonia Dana M.D.   On: 01/18/2015 17:27   Dg C-arm Bronchoscopy  01/08/2015   CLINICAL DATA:    C-ARM BRONCHOSCOPY  Fluoroscopy was utilized by the requesting physician.  No radiographic  interpretation.     Microbiology: Recent Results (from the past 240 hour(s))  MRSA PCR Screening     Status: None   Collection Time: 01/17/15  7:23 AM  Result Value Ref Range Status   MRSA by PCR NEGATIVE NEGATIVE Final    Comment:        The GeneXpert MRSA Assay (FDA approved for NASAL specimens only),  is one component of  a comprehensive MRSA colonization surveillance program. It is not intended to diagnose MRSA infection nor to guide or monitor treatment for MRSA infections.   Surgical pcr screen     Status: Abnormal   Collection Time: 01/17/15  7:23 AM  Result Value Ref Range Status   MRSA, PCR NEGATIVE NEGATIVE Final   Staphylococcus aureus POSITIVE (A) NEGATIVE Final    Comment:        The Xpert SA Assay (FDA approved for NASAL specimens in patients over 40 years of age), is one component of a comprehensive surveillance program.  Test performance has been validated by [facility] for patients greater than or equal to 36 year old. It is not intended to diagnose infection nor to guide or monitor treatment.   Urine culture     Status: None   Collection Time: 01/18/15 10:48 AM  Result Value Ref Range Status   Specimen Description URINE, CATHETERIZED  Final   Special Requests NONE  Final   Culture NO GROWTH 1 DAY  Final   Report Status 01/19/2015 FINAL  Final     Labs: Basic Metabolic Panel:  Recent Labs Lab 01/19/15 0840 01/19/15 2325 01/20/15 0235 01/21/15 0255 01/22/15 0414  NA 134* 132* 132* 138 138  K 4.8 4.0 4.3 3.7 3.4*  CL 102 101 103 110 108  CO2 24 23 21* 19* 22  GLUCOSE 92 98 95 169* 114*  BUN 25* 30* 32* 35* 36*  CREATININE 0.93 1.27* 1.21* 1.55* 1.63*  CALCIUM 8.2* 7.5* 7.6* 7.9* 8.0*  MG  --   --  1.6*  --   --   PHOS  --   --  4.8*  --   --    Liver Function Tests:  Recent Labs Lab 01/17/15 0447  AST 26  ALT 41  ALKPHOS 52  BILITOT 0.6  PROT 4.9*  ALBUMIN 2.5*   No results for input(s): LIPASE, AMYLASE in the last 168 hours. No results for input(s): AMMONIA in the last 168 hours. CBC:  Recent Labs Lab 01/16/15 1452  01/18/15 0817 01/19/15 0840 01/19/15 2325 01/20/15 0235 01/21/15 0255 01/22/15 0414  WBC 14.9*  < > 13.5* 9.1 3.3* 4.3 10.4 13.8*  NEUTROABS 13.7*  --  12.9*  --   --   --   --   --   HGB 13.0  < > 14.1 12.5 9.4* 9.4* 8.2*  8.4*  HCT 37.5  < > 40.4 35.9* 27.2* 27.0* 23.3* 24.1*  MCV 93.1  < > 91.8 92.1 91.0 90.6 89.6 90.6  PLT 437*  < > 427* 283 209 189 162 125*  < > = values in this interval not displayed. Cardiac Enzymes: No results for input(s): CKTOTAL, CKMB, CKMBINDEX, TROPONINI in the last 168 hours. BNP: BNP (last 3 results) No results for input(s): BNP in the last 8760 hours.  ProBNP (last 3 results) No results for input(s): PROBNP in the last 8760 hours.  CBG:  Recent Labs Lab 01/22/15 0615 01/22/15 1112 01/22/15 1608 01/22/15 2120 01/23/15 0635  GLUCAP 96 123* 142* 166* 205*       Signed:  Nita Sells  [facility] 01/23/2015, 8:24 AM

## 2015-01-23 NOTE — Progress Notes (Signed)
Daily Progress Note   Patient Name: Kristin Griffin       Date: 01/23/2015 DOB: 19-Jul-1950  Age: 64 y.o. MRN#: [id number] Attending Physician: Nita Sells, MD Primary Care Physician: Delman Cheadle, MD Admit Date: 01/16/2015  Reason for Consultation/Follow-up: Disposition, Establishing goals of care, Non pain symptom management and Pain control  Subjective: I was called by Dr. Verlon Au to come and speak with Ms. Pelaez in her family regarding plan for discharge. Originally, plan was for her to be discharged to skilled nursing facility for rehabilitation. Ms. Antonelli shared with Dr. Verlon Au today that her desire is to transition home with hospice support rather than go to SNF for rehabilitation.  I met with the patient and her family to discuss options for getting back to home. I explained the services hospice would provide and emphasized that they do not provide 24 hour caregiving support. The family expressed understanding the day today caregiving falls family, friends, and hired assistance. They report this will not be an issue and would like to proceed forward with plan to discharge home with hospice support.  Length of Stay: 7 days  Current Medications: Scheduled Meds:  . amoxicillin-clavulanate  1 tablet Oral Q12H  . antiseptic oral rinse  7 mL Mouth Rinse QID  . chlorhexidine  15 mL Mouth/Throat BID  . Chlorhexidine Gluconate Cloth  6 each Topical Daily  . dexamethasone  4 mg Oral Q12H  . feeding supplement (ENSURE ENLIVE)  237 mL Oral BID BM  . mupirocin ointment  1 application Nasal BID  . pantoprazole  40 mg Oral Daily  . silver nitrate applicators  1 application Topical Once  . verapamil  240 mg Oral QHS  . zolpidem  5 mg Oral QHS    Continuous Infusions:    PRN Meds: acetaminophen **OR** acetaminophen, acetaminophen **OR** acetaminophen, fentaNYL (SUBLIMAZE) injection, menthol-cetylpyridinium **OR** phenol, metoprolol, morphine CONCENTRATE, ondansetron **OR**  ondansetron (ZOFRAN) IV  Palliative Performance Scale: 30%     Vital Signs: BP 156/79 mmHg  Pulse 71  Temp(Src) 97.8 F (36.6 C) (Oral)  Resp 16  Ht '5\' 6"'  (1.676 m)  Wt 60.9 kg (134 lb 4.2 oz)  BMI 21.68 kg/m2  SpO2 94% SpO2: SpO2: 94 % O2 Device: O2 Device: Nasal Cannula O2 Flow Rate: O2 Flow Rate (L/min): 5 L/min  Intake/output summary:  Intake/Output Summary (Last 24 hours) at 01/23/15 2020 Last data filed at 01/23/15 1936  Gross per 24 hour  Intake    135 ml  Output   1200 ml  Net  -1065 ml   LBM:   Baseline Weight: Weight: 52.617 kg (116 lb) Most recent weight: Weight: 60.9 kg (134 lb 4.2 oz) (weighed with 1 pillow on bed and leg brace on)  Physical Exam: General:Sleepy but aroused easily .  HEENT: No bruits, no goiter, no JVD Heart: Regular rate and rhythm. No murmur appreciated. Lungs:Fair air movement, clear Abdomen: Soft, nontender somewhat distended, positive bowel sounds. bilateral ma Skin: Warm and dry              Additional Data Reviewed: Recent Labs     01/22/15  0414  01/23/15  0730  WBC  13.8*  11.4*  HGB  8.4*  8.0*  PLT  125*  60*  NA  138  139  BUN  36*  36*  CREATININE  1.63*  1.20*     Problem List:  Patient Active Problem List   Diagnosis Date Noted  . Abdominal pain, acute   .  Closed right tibial fracture   . DNR (do not resuscitate) 01/22/2015  . Pressure ulcer 01/20/2015  . Hyponatremia 01/19/2015  . Abdominal pain   . Encounter for intubation   . Tibia/fibula fracture 01/16/2015  . Lung cancer, lower lobe 01/11/2015  . Right lower lobe lung mass   . S/P bronchoscopy with biopsy   . Brain metastasis 01/07/2015  . Lung mass   . Lytic bone lesions on xray   . Lung nodule 01/04/2015  . Hypokalemia 01/04/2015  . Protein-calorie malnutrition, severe 01/04/2015  . Right sided weakness 01/03/2015  . Brain mass 01/03/2015  . Osteoporosis 12/15/2013  . Hypertension      Palliative Care Assessment & Plan    Code  Status:  DNR  Goals of Care:  I met with the patient and her family. She is very clear in expressing that she wants to go home with hospice support.   I talked privately with her son, brother, and ex-husband to assess what the home situation would be like to see if it would be possible to create a safe discharge plan home with hospice support. Her son reports that he will be available to care for her 24 hours per day on her discharge with assistance from other friends and family. They are concerned that her home will need to have room cleaned out in order to place hospital bed and will start working on this.  Her family is very invested in plan to take her home. I spoke with primary hospitalist and called and left a message for care management to work to present options for home hospice to family.  Symptom Management: Reports well controlled at this time. Pain: Recommend continue oral morphine concentrate as first line. She also has fentanyl as back up if oral medication ineffective Nausea: Continue Zofran as needed  Prognosis: < 2 weeks Discharge Planning: Home with Hospice   Care plan was discussed with patient, her family including her son, and Dr. Verlon Au.  Thank you for allowing the Palliative Medicine Team to assist in the care of this patient.   Time In: 1605 Time Out: 1650 Total Time 45 Prolonged Time Billed no    Greater than 50%  of this time was spent counseling and coordinating care related to the above assessment and plan.   Micheline Rough, MD  01/23/2015, 8:20 PM  Please contact Palliative Medicine Team phone at [phone] for questions and concerns.

## 2015-01-23 NOTE — Progress Notes (Signed)
[facility]/Kristin Griffin Progress Note 4 Days Post-Op  Subjective: Patient asleep when arrived, awakened easily.  Nurse states that the patient has a poor appetite.  No acute distress  Objective: Vital signs in last 24 hours: Temp:  [97.5 F (36.4 C)] 97.5 F (36.4 C) (10/01 0537) Pulse Rate:  [87-115] 87 (10/01 0537) Resp:  [16-18] 18 (10/01 0537) BP: (158-170)/(78-93) 158/78 mmHg (10/01 0537) SpO2:  [95 %] 95 % (10/01 0537) Last BM Date: 01/22/15 (colostomy)  Intake/Output from previous day: 09/30 0701 - 10/01 0700 In: 240 [P.O.:240] Out: 800 [Urine:800] Intake/Output this shift: Total I/O In: 120 [P.O.:120] Out: -   General: Sleepy, not acute distress  Lungs: Clear to auscultation  Abd: Soft, ostomy is functioning fine  Extremities: No changes Intact.    Neuro: Intact  Lab Results:  '@LABLAST2'$ (wbc:2,hgb:2,hct:2,plt:2) BMET ) Recent Labs  01/22/15 0414 01/23/15 0730  NA 138 139  K 3.4* 3.2*  CL 108 112*  CO2 22 19*  GLUCOSE 114* 193*  BUN 36* 36*  CREATININE 1.63* 1.20*  CALCIUM 8.0* 7.9*   PT/INR No results for input(s): LABPROT, INR in the last 72 hours. ABG No results for input(s): PHART, HCO3 in the last 72 hours.  Invalid input(s): PCO2, PO2  Studies/Results: No results found.  Anti-infectives: Anti-infectives    Start     Dose/Rate Route Frequency Ordered Stop   01/23/15 0000  amoxicillin-clavulanate (AUGMENTIN) 875-125 MG tablet     1 tablet Oral Every 12 hours 01/23/15 0823     01/22/15 1900  amoxicillin-clavulanate (AUGMENTIN) 875-125 MG per tablet 1 tablet     1 tablet Oral Every 12 hours 01/22/15 1301     01/19/15 1800  piperacillin-tazobactam (ZOSYN) IVPB 3.375 g  Status:  Discontinued     3.375 g 12.5 mL/hr over 240 Minutes Intravenous Every 8 hours 01/19/15 1645 01/22/15 1301   01/18/15 2130  ceFAZolin (ANCEF) IVPB 2 g/50 mL premix     2 g 100 mL/hr over 30 Minutes Intravenous Every 6 hours 01/18/15 2054 01/19/15 1106       Assessment/Plan: s/p Procedure(s): EXPLORATORY LAPAROTOMY; SIGMOID COLECTOMY; END COLOSTOMY Discharge When appropriate SNF bed available.  Palliative care has been involved.  LOS: 7 days   Kristin Griffin. Dahlia Bailiff, MD, FACS [phone] [facility] 01/23/2015

## 2015-01-24 ENCOUNTER — Telehealth: Payer: Self-pay | Admitting: Radiology

## 2015-01-24 DIAGNOSIS — G893 Neoplasm related pain (acute) (chronic): Secondary | ICD-10-CM

## 2015-01-24 LAB — GLUCOSE, CAPILLARY
GLUCOSE-CAPILLARY: 112 mg/dL — AB (ref 65–99)
GLUCOSE-CAPILLARY: 119 mg/dL — AB (ref 65–99)
GLUCOSE-CAPILLARY: 127 mg/dL — AB (ref 65–99)
Glucose-Capillary: 140 mg/dL — ABNORMAL HIGH (ref 65–99)

## 2015-01-24 MED ORDER — DEXAMETHASONE 4 MG PO TABS: 4.0000 mg | ORAL_TABLET | Freq: Two times a day (BID) | ORAL | Status: AC

## 2015-01-24 MED ORDER — HYDROMORPHONE HCL 4 MG PO TABS: 4.0000 mg | ORAL_TABLET | ORAL | Status: AC | PRN

## 2015-01-24 MED ORDER — SILVER NITRATE-POT NITRATE 75-25 % EX MISC: 1.0000 "application " | Freq: Once | CUTANEOUS | Status: AC

## 2015-01-24 MED ORDER — LORAZEPAM 0.5 MG PO TABS: 0.5000 mg | ORAL_TABLET | Freq: Three times a day (TID) | ORAL | Status: AC

## 2015-01-24 NOTE — Telephone Encounter (Signed)
Dr Brigitte Pulse is listed as PCP hospice has asked if Dr Brigitte Pulse will attend to her while she is in Hospice / she has lung CA. Dr Brigitte Pulse has spoken to hospice nurse and will defer care to hospice providers.

## 2015-01-24 NOTE — Progress Notes (Signed)
Notified by Vicente Males [facility] of pt./family request for [facility] services at home after discharge.  Writer spoke with pt. At the bedside and with her son Eddie Dibbles on the phone. Education was initiated related to hospice philosophy, services and team approach to care. Family voiced understanding of information provided.Family requests foley be left in at discharge for comfort. Per discussion plan is for discharge home today by [facility]AR.  Please send completed and signed DNR form home with patient. Pt. will need PO prescriptions for discharge comfort medications.  DME needs discussed and family requests an electric bed with 1/2 rails, table, transport chair, walker, O2 at 5 L n/c, nebulizer and suction for delivery to the home today. Home address has been verified and is correct in the chart. Eddie Dibbles is the contact person to call to set up delivery time. [facility] contacted directly to order equipment.  [facility] aware of above. Please notify [facility] when pt. Is ready to discharge from the unit at [phone]. [facility] contact numbers have been given to the pt. and her son. Above information shared with Vicente Males [facility]. Please call with any questions.  [facility] Liaison [phone]

## 2015-01-24 NOTE — Progress Notes (Signed)
Daily Progress Note   Patient Name: Kristin Griffin       Date: 01/24/2015 DOB: 25-Oct-1950  Age: 64 y.o. MRN#: [id number] Attending Physician: Nita Sells, MD Primary Care Physician: Delman Cheadle, MD Admit Date: 01/16/2015  Reason for Consultation/Follow-up: Disposition, Establishing goals of care, Non pain symptom management and Pain control  Subjective: I met with Ms. Cotrell today. She reports that her pain is well-controlled and she is looking forward to going home later today.  I received a call from her son who reported he was worried he would not be able to get her pain medication after she is discharged. She is been ordered Dilaudid 4 mg tablets. Plan is for the prescription to go home with her at which point he can go and have it filled at a pharmacy. I called [facility] which is 24 hours and they report having the medication and stopped. I relayed this message to her son he reports he is pleased to know he'll be able to get it and appreciative of help.  Length of Stay: 8 days   Current Medications: Scheduled Meds:  . amoxicillin-clavulanate  1 tablet Oral Q12H  . antiseptic oral rinse  7 mL Mouth Rinse QID  . chlorhexidine  15 mL Mouth/Throat BID  . dexamethasone  4 mg Oral Q12H  . feeding supplement (ENSURE ENLIVE)  237 mL Oral BID BM  . mupirocin ointment  1 application Nasal BID  . pantoprazole  40 mg Oral Daily  . silver nitrate applicators  1 application Topical Once  . verapamil  240 mg Oral QHS  . zolpidem  5 mg Oral QHS    Continuous Infusions:    PRN Meds: acetaminophen **OR** acetaminophen, acetaminophen **OR** acetaminophen, fentaNYL (SUBLIMAZE) injection, menthol-cetylpyridinium **OR** phenol, metoprolol, morphine CONCENTRATE, ondansetron **OR** ondansetron (ZOFRAN) IV  Palliative Performance Scale: 30%     Vital Signs: BP 164/85 mmHg  Pulse 92  Temp(Src) 97.8 F (36.6 C) (Oral)  Resp 18  Ht _0  (1.676 m)  Wt 60.9 kg (134 lb 4.2 oz)  BMI 21.68  kg/m2  SpO2 98% SpO2: SpO2: 98 % O2 Device: O2 Device: Nasal Cannula O2 Flow Rate: O2 Flow Rate (L/min): 5 L/min  Intake/output summary:   Intake/Output Summary (Last 24 hours) at 01/24/15 1729 Last data filed at 01/24/15 1300  Gross per 24 hour  Intake     70 ml  Output    300 ml  Net   -230 ml   LBM:   Baseline Weight: Weight: 52.617 kg (116 lb) Most recent weight: Weight: 60.9 kg (134 lb 4.2 oz) (weighed with 1 pillow on bed and leg brace on)  Physical Exam: General:Sleepy but aroused easily .  HEENT: No bruits, no goiter, no JVD Heart: Regular rate and rhythm. No murmur appreciated. Lungs:Fair air movement, clear Abdomen: Soft, nontender somewhat distended, positive bowel sounds. bilateral ma Skin: Warm and dry              Additional Data Reviewed: Recent Labs     01/22/15  0414  01/23/15  0730  WBC  13.8*  11.4*  HGB  8.4*  8.0*  PLT  125*  60*  NA  138  139  BUN  36*  36*  CREATININE  1.63*  1.20*     Problem List:  Patient Active Problem List   Diagnosis Date Noted  . Abdominal pain, acute   . Closed right tibial fracture   . DNR (do not resuscitate) 01/22/2015  . Pressure  ulcer 01/20/2015  . Hyponatremia 01/19/2015  . Abdominal pain   . Encounter for intubation   . Tibia/fibula fracture 01/16/2015  . Lung cancer, lower lobe ([place]) 01/11/2015  . Right lower lobe lung mass   . S/P bronchoscopy with biopsy   . Brain metastasis ([place]) 01/07/2015  . Lung mass   . Lytic bone lesions on xray   . Lung nodule 01/04/2015  . Hypokalemia 01/04/2015  . Protein-calorie malnutrition, severe ([place]) 01/04/2015  . Right sided weakness 01/03/2015  . Brain mass 01/03/2015  . Osteoporosis 12/15/2013  . Hypertension      Palliative Care Assessment & Plan    Code Status:  DNR  Goals of Care:  Patient is going home today with hospice support  Symptom Management: Reports well controlled at this time. Pain: Recommend discharge with oral  Dilaudid Nausea: Continue Zofran as needed Anxiety: Recommend discharge with Ativan  Prognosis: < 2 weeks Discharge Planning: Home with Hospice   Care plan was discussed with patient, her son, Lattie Haw from [facility], and bedside nurse  Thank you for allowing the Palliative Medicine Team to assist in the care of this patient.   Time In: 1530 Time Out: 1555 Total Time 25 Prolonged Time Billed no    Greater than 50%  of this time was spent counseling and coordinating care related to the above assessment and plan.   Micheline Rough, MD  01/24/2015, 5:29 PM  Please contact Palliative Medicine Team phone at [phone] for questions and concerns.

## 2015-01-24 NOTE — Progress Notes (Addendum)
CM spoke to Kristin Griffin with hospice @ [phone] with palliative care of [facility] and will work on possible d/c home today with hospice. CM was advised that Kristin Griffin @ [phone] would be up to assess patient and further arrange for DME needs. CM spoke to son, Kristin Griffin on the phone who advised that no BSC commode was needed as pt has foley catheter and ostomy. CM will continue to monitor for discharge planning needs. Please contact me @ [phone] for any question. Pt will still need Wheelchair, hospital bed, oxygen, walker, side table. CM faxed face sheet and H&P along with DME orders to [facility] agency @ [phone]. Confirmation received.

## 2015-01-24 NOTE — Care Management Note (Signed)
Case Management Note  Patient Details  Name: Kristin Griffin MRN: [id number] Date of Birth: [birth year]  Subjective/Objective:                  Metastatic lung cancer Perforated viscus with peritonitis Post op exploratory laparotomy, sigmoid colectomy, end colostomy ---Dr. Brantley Stage Dispohome on hospice  Action/Plan: Cm spoke to pt and family at the bedside regarding request for home with hospice discharge disposition. CM offered choice of [facility] and pt and family would like to use [facility] if possible. CM called [phone] and record states to call [phone]. CM called that number and spoke to [facility] who took pt information and said someone would be calling CM back shortly. Son, Eddie Dibbles, noted that he would be able to be present for 24 hour care. CM confirmed that son had full understanding that he would be providing all ADL care but that if he had questions or change in condition that he could call the Hospice nurse for questions. Son verbalized understanding and said that he would just need more instructions on care of the ostomy, foley, and any dressing changes. Per the patient, DME needs would be hospital bed, walker, BSC, over bed tray, and oxygen setup. CM confirmed son's contact number to be [phone]. CM confirmed contact information per facesheet for member. CM asked the family if they would have the house ready to receive the pt today and he said they should be able to later this afternoon/early evening. Pt confirmed PCP to be  Dr. Delman Cheadle. Cm also provided son with a list of private duty nursing agencies should he need more help at home. CM will await callback from hospice agency for further discharge planning needs.   Expected Discharge Date:  01/24/15            Expected Discharge Plan:  Home w Hospice Care  In-House Referral:  Clinical Social Work  Discharge planning Services  CM Consult  Post Acute Care Choice:    Choice offered to:      DME Arranged:    DME Agency:     HH Arranged:    HH Agency:     Status of Service:  In process, will continue to follow  Medicare Important Message Given:  Yes-third notification given Date Medicare IM Given:    Medicare IM give by:    Date Additional Medicare IM Given:    Additional Medicare Important Message give by:     If discussed at [facility] of Stay Meetings, dates discussed:    Additional Comments:  Guido Sander, RN 01/24/2015, 10:15 AM

## 2015-01-24 NOTE — Progress Notes (Signed)
[facility] called to transport patient home.  Hospice RN with [facility] called and informed [facility] will soon transport patient home.  The contact number is [phone].  Night shift RN, Quillian Quince notified to contact Hospice if a return call is not received in the next 15-20 minutes.

## 2015-01-24 NOTE — Progress Notes (Signed)
[facility] Progress Note  5 Days Post-Op  Subjective: Pt doesn't have much pain.  Wound was bleeding a lot throughout the night.  No N/V.  Tolerating some fulls, but appetite low.  Having flatus and BM in ostomy bag.  Urinating okay.  Son says she's ready to pass.  Objective: Vital signs in last 24 hours: Temp:  [97.8 F (36.6 C)] 97.8 F (36.6 C) (10/01 1935) Pulse Rate:  [71-88] 71 (10/01 1935) Resp:  [16-18] 16 (10/01 1935) BP: (142-156)/(72-79) 156/79 mmHg (10/01 1935) SpO2:  [94 %-96 %] 94 % (10/01 1935) Last BM Date: 01/22/15 (colostomy)  Intake/Output from previous day: 10/01 0701 - 10/02 0700 In: 135 [P.O.:135] Out: 1200 [Urine:1200] Intake/Output this shift:    PE: Gen:  Alert, NAD, pleasant Abd: Soft, NT/ND, +BS, no HSM, midline wound open clean, significant bloody clots on dressing today, cauterized the wound at the right mid-portion with silver nitrate sticks, ostomy patent with soft brown stools and flatus   Lab Results:   Recent Labs  01/22/15 0414 01/23/15 0730  WBC 13.8* 11.4*  HGB 8.4* 8.0*  HCT 24.1* 23.1*  PLT 125* 60*   BMET  Recent Labs  01/22/15 0414 01/23/15 0730  NA 138 139  K 3.4* 3.2*  CL 108 112*  CO2 22 19*  GLUCOSE 114* 193*  BUN 36* 36*  CREATININE 1.63* 1.20*  CALCIUM 8.0* 7.9*   PT/INR No results for input(s): LABPROT, INR in the last 72 hours. CMP     Component Value Date/Time   NA 139 01/23/2015 0730   K 3.2* 01/23/2015 0730   CL 112* 01/23/2015 0730   CO2 19* 01/23/2015 0730   GLUCOSE 193* 01/23/2015 0730   BUN 36* 01/23/2015 0730   CREATININE 1.20* 01/23/2015 0730   CREATININE 0.73 01/01/2015 1508   CALCIUM 7.9* 01/23/2015 0730   PROT 4.1* 01/23/2015 0730   ALBUMIN 1.5* 01/23/2015 0730   AST 27 01/23/2015 0730   ALT <5* 01/23/2015 0730   ALKPHOS 60 01/23/2015 0730   BILITOT 0.6 01/23/2015 0730   GFRNONAA 46* 01/23/2015 0730   GFRNONAA 87 01/01/2015 1508   GFRAA 54* 01/23/2015 0730   GFRAA  >89 01/01/2015 1508   Lipase  No results found for: LIPASE     Studies/Results: No results found.  Anti-infectives: Anti-infectives    Start     Dose/Rate Route Frequency Ordered Stop   01/23/15 0000  amoxicillin-clavulanate (AUGMENTIN) 875-125 MG tablet     1 tablet Oral Every 12 hours 01/23/15 0823     01/22/15 1900  amoxicillin-clavulanate (AUGMENTIN) 875-125 MG per tablet 1 tablet     1 tablet Oral Every 12 hours 01/22/15 1301     01/19/15 1800  piperacillin-tazobactam (ZOSYN) IVPB 3.375 g  Status:  Discontinued     3.375 g 12.5 mL/hr over 240 Minutes Intravenous Every 8 hours 01/19/15 1645 01/22/15 1301   01/18/15 2130  ceFAZolin (ANCEF) IVPB 2 g/50 mL premix     2 g 100 mL/hr over 30 Minutes Intravenous Every 6 hours 01/18/15 2054 01/19/15 1106       Assessment/Plan Metastatic lung cancer -Palliative care following Perforated viscus with peritonitis POD #5 exploratory laparotomy, sigmoid colectomy, end colostomy ---Dr. Brantley Stage -ostomy functioning, on fulls with low appetite, advance to soft -QD wet to dry dressing changes to midline wound -Bleeding from mid-portion of the incisional wound - silver nitrate sticks utilized today Dispo-per IM/palliative care. Okay for SNF or home on hospice from our standpoint.  LOS: 8 days    Nat Christen 01/24/2015, 8:11 AM Pager: [phone] ]

## 2015-01-24 NOTE — Progress Notes (Signed)
family decided on hospice at home [facility] still to come and meet family No further aggressive care-focus is comfort Some changes made to d/c summary See ammended summary hospice package 'a" ordered  Verneita Griffes, MD [facility] [phone]

## 2015-01-24 NOTE — Progress Notes (Signed)
LCSW made aware of patient going home today with hospice. All has been arranged from Hospice stand point as well as CM Patient will require an ambulance per CM after 5 pm. DNR form to be filled out by MD and to go with patient.    LCSW will fill out medical necessity and place on chart at time of patient being picked up. Call placed to RN for patient to have DNR form and plan. Agreeable to plan. Son is at home getting all equipment in the home.  LCSW to follow and assist with transport.  Lane Hacker, MSW Clinical Social Work: Emergency Room [phone]

## 2015-01-24 NOTE — Progress Notes (Signed)
CM spoke to Hospice RN, Lattie Haw who said that the DME is to be delivered today. Sw to address transportation. MD to sign RX for pain medication and ativan order. CM advised nurse of contact number for Sonia Baller with hospice to be [phone] and to call once DME delivery time confirmed as well as when patient is being discharged.

## 2015-01-27 ENCOUNTER — Ambulatory Visit: Payer: No Typology Code available for payment source | Admitting: Radiation Oncology

## 2015-01-28 ENCOUNTER — Other Ambulatory Visit: Payer: No Typology Code available for payment source

## 2015-01-28 ENCOUNTER — Inpatient Hospital Stay: Payer: No Typology Code available for payment source | Admitting: Internal Medicine

## 2015-01-29 ENCOUNTER — Encounter: Payer: Self-pay | Admitting: Radiation Oncology

## 2015-02-01 ENCOUNTER — Ambulatory Visit: Payer: No Typology Code available for payment source | Admitting: Radiation Oncology

## 2015-02-03 ENCOUNTER — Telehealth: Payer: Self-pay | Admitting: Family Medicine

## 2015-02-03 NOTE — Telephone Encounter (Signed)
Called son about cancelled appointment son stated that mother had passed away last week

## 2015-02-04 ENCOUNTER — Encounter: Payer: Self-pay | Admitting: Family Medicine

## 2015-02-05 ENCOUNTER — Encounter: Payer: Self-pay | Admitting: Family Medicine

## 2015-02-23 DEATH — deceased
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
## 2016-05-03 IMAGING — DX DG CHEST 1V PORT
1 series · 1 of 1 positions shown · non-contrast
Comparison: Single view of the chest 01/19/2015. CT chest
01/04/2015.

CLINICAL DATA: Status post sigmoid colectomy for colon perforation
01/16/2015. History of metastatic lung carcinoma.

EXAM:
PORTABLE CHEST 1 VIEW

[chest ap]
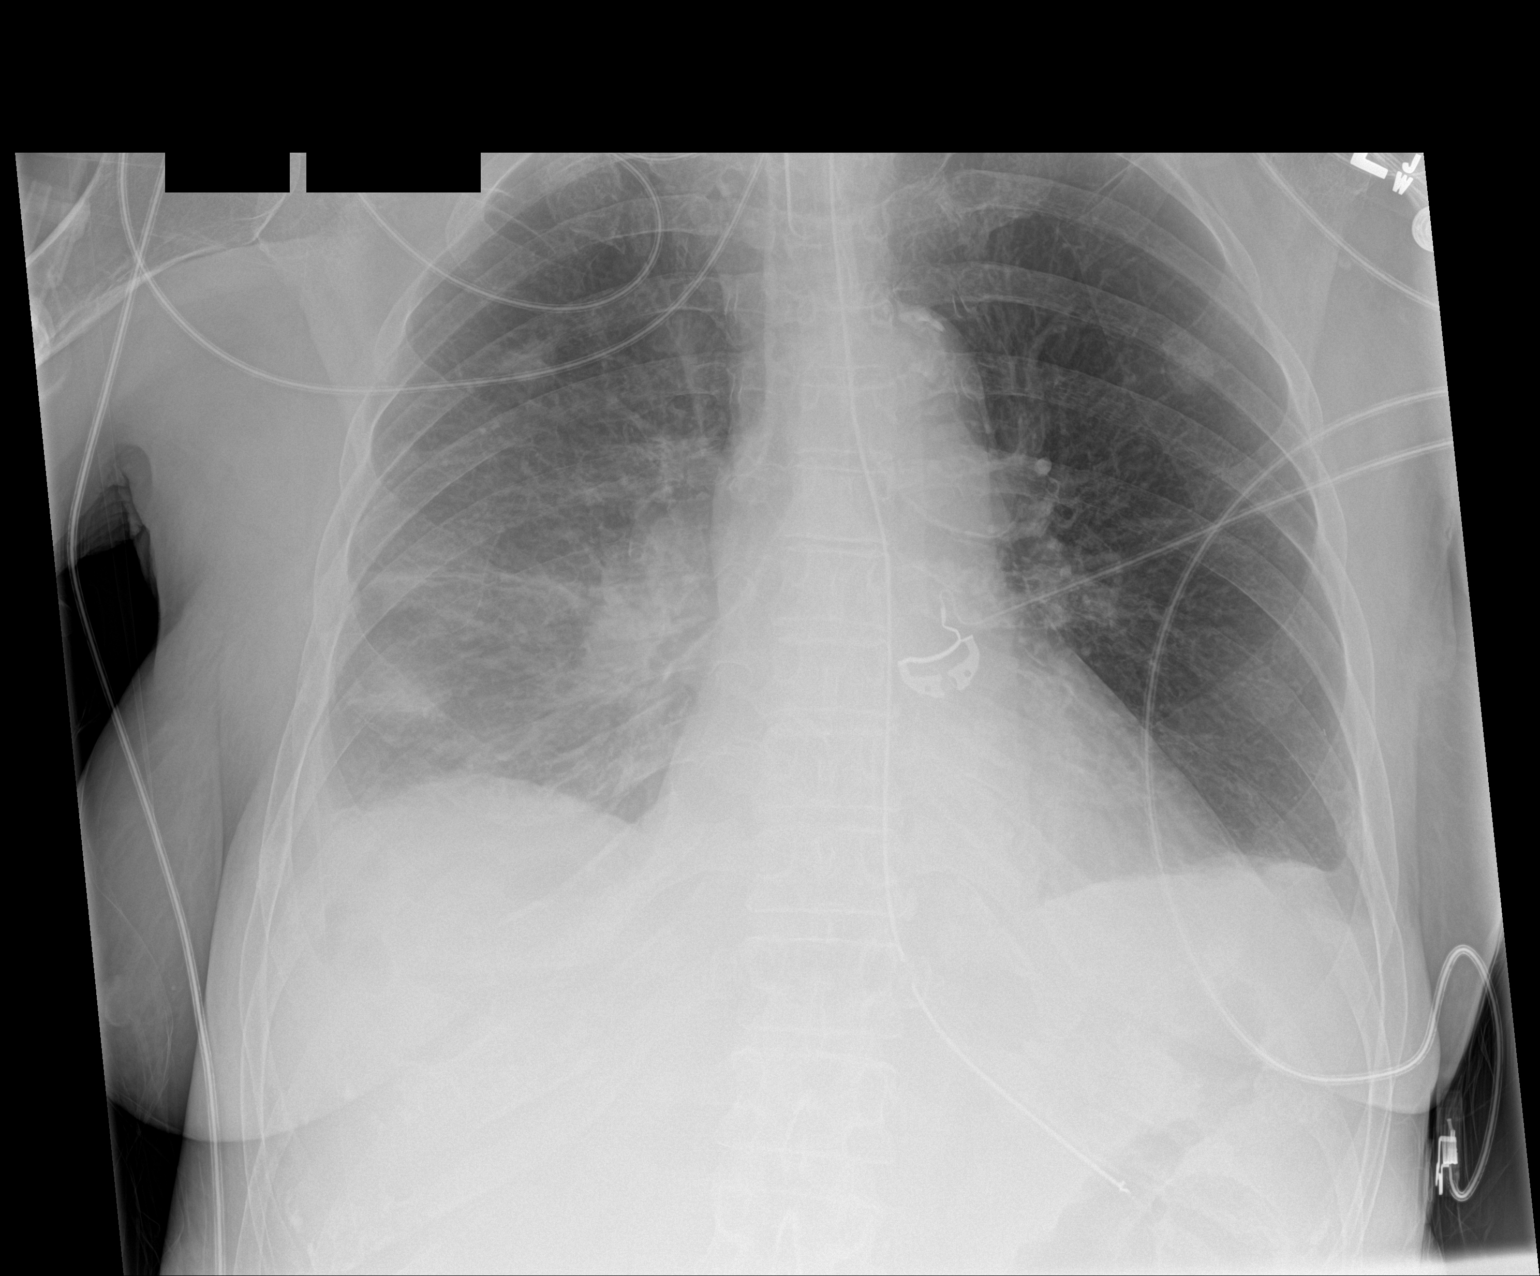

[1 of 1 positions shown; findings below may reference images not displayed]

FINDINGS: Endotracheal tube and NG tube remain in place. Bilateral pulmonary
nodules are again seen. Layering right pleural effusion and basilar
atelectasis are again identified. Very small left pleural effusion
is noted. The appearance of the chest is unchanged.
IMPRESSION: Support apparatus projects in good position.

No change in right greater than left pleural effusions and basilar
atelectasis.

Bilateral pulmonary nodules consistent with metastatic disease.
